# Patient Record
Sex: Female | Born: 1945 | Race: White | Hispanic: No | Marital: Married | Smoking: Former smoker
Health system: Southern US, Community
[De-identification: ages and names within clinical notes are randomized; demographics above are authoritative.]

## PROBLEM LIST (undated history)

## (undated) DIAGNOSIS — K219 Gastro-esophageal reflux disease without esophagitis: Secondary | ICD-10-CM

## (undated) DIAGNOSIS — Z9889 Other specified postprocedural states: Secondary | ICD-10-CM

## (undated) DIAGNOSIS — N83202 Unspecified ovarian cyst, left side: Secondary | ICD-10-CM

## (undated) DIAGNOSIS — R112 Nausea with vomiting, unspecified: Secondary | ICD-10-CM

## (undated) DIAGNOSIS — J45909 Unspecified asthma, uncomplicated: Secondary | ICD-10-CM

## (undated) DIAGNOSIS — Z889 Allergy status to unspecified drugs, medicaments and biological substances status: Secondary | ICD-10-CM

## (undated) DIAGNOSIS — I10 Essential (primary) hypertension: Secondary | ICD-10-CM

## (undated) DIAGNOSIS — Z8489 Family history of other specified conditions: Secondary | ICD-10-CM

## (undated) DIAGNOSIS — K5900 Constipation, unspecified: Secondary | ICD-10-CM

## (undated) DIAGNOSIS — J40 Bronchitis, not specified as acute or chronic: Secondary | ICD-10-CM

## (undated) DIAGNOSIS — J349 Unspecified disorder of nose and nasal sinuses: Secondary | ICD-10-CM

## (undated) DIAGNOSIS — N83201 Unspecified ovarian cyst, right side: Secondary | ICD-10-CM

## (undated) DIAGNOSIS — D649 Anemia, unspecified: Secondary | ICD-10-CM

## (undated) DIAGNOSIS — Z87442 Personal history of urinary calculi: Secondary | ICD-10-CM

## (undated) DIAGNOSIS — M1990 Unspecified osteoarthritis, unspecified site: Secondary | ICD-10-CM

## (undated) HISTORY — PX: BACK SURGERY: SHX140

## (undated) HISTORY — DX: Unspecified disorder of nose and nasal sinuses: J34.9

## (undated) HISTORY — PX: CATARACT EXTRACTION: SUR2

## (undated) HISTORY — PX: BREAST EXCISIONAL BIOPSY: SUR124

## (undated) HISTORY — DX: Essential (primary) hypertension: I10

## (undated) HISTORY — PX: OTHER SURGICAL HISTORY: SHX169

---

## 1982-12-07 HISTORY — PX: TUBAL LIGATION: SHX77

## 2007-03-24 ENCOUNTER — Ambulatory Visit: Payer: Self-pay | Admitting: *Deleted

## 2007-05-03 ENCOUNTER — Ambulatory Visit: Payer: Self-pay | Admitting: Gastroenterology

## 2010-04-03 ENCOUNTER — Ambulatory Visit: Payer: Self-pay | Admitting: Family Medicine

## 2012-05-10 ENCOUNTER — Ambulatory Visit: Payer: Self-pay | Admitting: Family Medicine

## 2013-02-14 ENCOUNTER — Encounter: Payer: Self-pay | Admitting: Pulmonary Disease

## 2013-02-14 ENCOUNTER — Ambulatory Visit (INDEPENDENT_AMBULATORY_CARE_PROVIDER_SITE_OTHER): Payer: BC Managed Care – PPO | Admitting: Pulmonary Disease

## 2013-02-14 VITALS — BP 142/80 | HR 80 | Temp 98.2°F | Ht 67.0 in | Wt 207.0 lb

## 2013-02-14 DIAGNOSIS — J382 Nodules of vocal cords: Secondary | ICD-10-CM | POA: Insufficient documentation

## 2013-02-14 DIAGNOSIS — R05 Cough: Secondary | ICD-10-CM

## 2013-02-14 NOTE — Assessment & Plan Note (Signed)
She apparently did well with Flonase for her near continuous nasal congestion and postnasal drip. She stopped this medication because "she went to see how bad it was". I think that this is likely contributing to her cough.  Plan: -Resume cetirizine, Flonase

## 2013-02-14 NOTE — Patient Instructions (Addendum)
We will send you for a Chest X-ray to evaluate your cough  Keep using your inhaler (albuterol) as needed for shortness of breath/cough with the spacer we provided you  Resume your flonase  Use [facility] rinses with distilled water at least twice per day using the instructions on the package. 1/2 hour after using the [facility] rinse, use Flonase two puffs in each nostril once per day. Use chlortrimeton and an over the counter decongestant (pseudophed or phenylphrine) as needed for the cough. If you are not better in 2-3 weeks, then start taking Prilosec 20mg  oral daily.  Follow the lifestyle modification information we provided you regarding acid reflux  We will see you back in 3-4 weeks or sooner if needed

## 2013-02-14 NOTE — Assessment & Plan Note (Signed)
She has had to have these removed in the past. She experienced hoarseness at the time. She is currently experiencing some hoarseness now which raises concern for recurrent nodules. We will treat her postnasal drip and see if the hoarseness improved, but if it does not she will need referral to ear nose and throat.

## 2013-02-14 NOTE — Assessment & Plan Note (Signed)
I explained to Mrs. Jobst that the most likely etiology for her prolonged cough is that she contracted a virus such as influenza in November and has taken a while for her to heal. We have had multiple cases of influenza this year which have caused prolonged cough symptoms because of significant airway damage. Fortunately, the vast majority of these people improve with time.  I am reassured that she has little evidence of underlying lung pathology given her clear lungs on physical exam as well as the normal simple spirometry seen today in clinic. She needs to have a chest x-ray to rule out any other abnormality.  I think that allergic rhinitis and possibly GERD also contributes to her ongoing cough associated as below for further advice.  Plan: -Chest x-ray for cough -See allergic rhinitis and GERD -When necessary over-the-counter cough suppressant as needed.

## 2013-02-14 NOTE — Assessment & Plan Note (Signed)
She has some acid reflux symptoms but these are basically mild. If her cough does not improve with treatment of the allergic rhinitis and supportive care they may be reasonable to start her on something for acid reflux. In the meantime we provided her with literature today for lifestyle modification to prevent and manage acid reflux.

## 2013-02-14 NOTE — Progress Notes (Signed)
Subjective:    Patient ID: Brandi Newton, female    DOB: Sep 17, 1946, 67 y.o.   MRN: [id number]  HPI  This is a very pleasant 67 year old female who comes to our clinic today for evaluation of cough. She had a normal childhood without respiratory illnesses but in November of 2013 she developed a significant cough. She stated that her symptoms started with sinus congestion and postnasal drip and eventually led to a "tickling throat" and chest congestion. She has been treated with multiple rounds of antibiotics and started on inhaler for the cough. She also developed shortness of breath with the coughing spells which she has never had before.  In general, her symptoms have improved but she still has a persistent cough and wheezing at night. She states that she feels like she has a "tickling" in her throat and she has the sensation that she needs to clear her throat frequently. If she laughs a lot she will start with a coughing spell that last for quite some time. This is typically followed by some shortness of breath and wheezing. She is also noted that she's had increasing snoring.   She states that in the past she has had seen years nodules removed from her vocal cords. When she had that she felt hoarse. In the last several months she has felt hoarse again and so she is wondered if the nodules are back.  She also notes some shortness of breath when she has to move quickly such as with prior troubles at school. She is capable of climbing multiple flights of stairs, carrying in groceries, and performing all of her activities of daily living without limitation. Mostly her shortness of breath occurs with coughing spells.  She has sinus congestion and an occasional sensation of a postnasal drip. This is improved significantly with the recent addition of Flonase. She also notes heartburn with certain medications but it is not a regular occurrence.   Past Medical History  Diagnosis Date  . Hypertension    . Sinus problem      Family History  Problem Relation Age of Onset  . Heart disease Mother   . Heart disease Father   . Skin cancer Father      History   Social History  . Marital Status: Married    Spouse Name: N/A    Number of Children: 2  . Years of Education: N/A   Occupational History  . Teacher    Social History Main Topics  . Smoking status: Former Smoker -- 0.50 packs/day for 20 years    Types: Cigarettes    Quit date: 12/07/1978  . Smokeless tobacco: Not on file  . Alcohol Use: No  . Drug Use: No  . Sexually Active: Not on file   Other Topics Concern  . Not on file   Social History Narrative  . No narrative on file     No Known Allergies   No outpatient prescriptions prior to visit.   No facility-administered medications prior to visit.      Review of Systems  Constitutional: Negative for fever, chills and unexpected weight change.  HENT: Positive for voice change and sinus pressure. Negative for ear pain, nosebleeds, congestion, sore throat, rhinorrhea, sneezing, trouble swallowing, dental problem and postnasal drip.   Eyes: Negative for visual disturbance.  Respiratory: Positive for cough and shortness of breath. Negative for choking.   Cardiovascular: Negative for chest pain and leg swelling.  Gastrointestinal: Negative for vomiting, abdominal pain and diarrhea.  Genitourinary:  Negative for difficulty urinating.  Musculoskeletal: Positive for arthralgias.  Skin: Negative for rash.  Neurological: Negative for tremors, syncope and headaches.  Hematological: Does not bruise/bleed easily.       Objective:   Physical Exam  Filed Vitals:   02/14/13 1528  BP: 142/80  Pulse: 80  Temp: 98.2 F (36.8 C)  TempSrc: Oral  Height: 5\' 7"  (1.702 m)  Weight: 207 lb (93.895 kg)  SpO2: 97%   Gen: well appearing, no acute distress HEENT: NCAT, PERRL, EOMi, OP clear, neck supple without masses PULM: Rhonchi anterior R > L; otherwise clear CV:  RRR, no mgr, no JVD AB: BS+, soft, nontender, no hsm Ext: warm, no edema, no clubbing, no cyanosis Derm: no rash or skin breakdown Neuro: A&Ox4, CN II-XII intact, strength 5/5 in all 4 extremities  02/14/2013 Simple spirometry normal     Assessment & Plan:   Cough I explained to Mrs. Villari that the most likely etiology for her prolonged cough is that she contracted a virus such as influenza in November and has taken a while for her to heal. We have had multiple cases of influenza this year which have caused prolonged cough symptoms because of significant airway damage. Fortunately, the vast majority of these people improve with time.  I am reassured that she has little evidence of underlying lung pathology given her clear lungs on physical exam as well as the normal simple spirometry seen today in clinic. She needs to have a chest x-ray to rule out any other abnormality.  I think that allergic rhinitis and possibly GERD also contributes to her ongoing cough associated as below for further advice.  Plan: -Chest x-ray for cough -See allergic rhinitis and GERD -When necessary over-the-counter cough suppressant as needed.  Allergic rhinitis She apparently did well with Flonase for her near continuous nasal congestion and postnasal drip. She stopped this medication because "she went to see how bad it was". I think that this is likely contributing to her cough.  Plan: -Resume cetirizine, Flonase  GERD (gastroesophageal reflux disease) She has some acid reflux symptoms but these are basically mild. If her cough does not improve with treatment of the allergic rhinitis and supportive care they may be reasonable to start her on something for acid reflux. In the meantime we provided her with literature today for lifestyle modification to prevent and manage acid reflux.  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 She experienced hoarseness at the time. She is currently  experiencing some hoarseness now which raises concern for recurrent nodules. We will treat her postnasal drip and see if the hoarseness improved, but if it does not she will need referral to ear nose and throat.    Updated Medication List Outpatient Encounter Prescriptions as of 02/14/2013  Medication Sig Dispense Refill  . albuterol (PROAIR HFA) 108 (90 BASE) MCG/ACT inhaler Inhale 2 puffs into the lungs every 6 (six) hours as needed for wheezing.      . cetirizine (ZYRTEC) 10 MG tablet Take 10 mg by mouth daily as needed for allergies.      . fluticasone (FLONASE) 50 MCG/ACT nasal spray 2 puffs each nostril daily as needed      . losartan-hydrochlorothiazide (HYZAAR) 50-12.5 MG per tablet Take 1 tablet by mouth daily.       No facility-administered encounter medications on file as of 02/14/2013.

## 2013-02-15 ENCOUNTER — Ambulatory Visit (INDEPENDENT_AMBULATORY_CARE_PROVIDER_SITE_OTHER)
Admission: RE | Admit: 2013-02-15 | Discharge: 2013-02-15 | Disposition: A | Discharge status: Home / Self Care | Payer: BC Managed Care – PPO | Source: Ambulatory Visit | Attending: Pulmonary Disease | Admitting: Pulmonary Disease

## 2013-02-17 NOTE — Progress Notes (Signed)
Quick Note:  Spoke with spouse and notified of results and he verbalized understanding and will inform the pt ______

## 2013-02-27 ENCOUNTER — Telehealth: Payer: Self-pay | Admitting: Pulmonary Disease

## 2013-02-27 NOTE — Telephone Encounter (Signed)
Pt cancelled appt 4/1; states x-ray was clear and the appt was to f/u on the x-ray so sees no need.  Pt also wants to be sure that her results of COPD test and x-rays are sent to [facility].  Pt states she had done the record request and we have it on file.

## 2013-02-28 NOTE — Telephone Encounter (Signed)
Returning call can be reached at [phone] says when you call to have her paged.Brandi Newton

## 2013-02-28 NOTE — Telephone Encounter (Signed)
Pt's spouse called back. He says he will advise his wife to call if she needs anything further. Otherwise she is unable to keep calling back and fourth since she is a [employer]. She did not need an appt as of now per spouse, just needs results forwarded per request. Brandi Newton

## 2013-02-28 NOTE — Telephone Encounter (Signed)
Called, spoke with pt's husband.  Reports pt is currently at work.  He will ask her to call [place] office when she gets off.  Will hold msg in triage to f/u on as it wasn't created by our office.

## 2013-03-01 NOTE — Telephone Encounter (Addendum)
Pt returned triage's call.  Pt states to call her at [phone] & have her paged.   Antionette Fairy

## 2013-03-01 NOTE — Telephone Encounter (Signed)
lmtcb x1-- need to know the name of what physician she wants [place] to send this to and phone #. See no records release on file.

## 2013-03-01 NOTE — Telephone Encounter (Signed)
I have faxed records from her visit to Dr Lorin Picket at [facility] on her home phone to let her know this was done

## 2013-03-07 ENCOUNTER — Ambulatory Visit: Payer: BC Managed Care – PPO | Admitting: Pulmonary Disease

## 2013-04-18 ENCOUNTER — Ambulatory Visit: Payer: Self-pay | Admitting: Family Medicine

## 2013-07-18 ENCOUNTER — Ambulatory Visit: Payer: Self-pay | Admitting: Family Medicine

## 2015-01-24 DIAGNOSIS — M1712 Unilateral primary osteoarthritis, left knee: Secondary | ICD-10-CM | POA: Insufficient documentation

## 2015-01-24 DIAGNOSIS — M170 Bilateral primary osteoarthritis of knee: Secondary | ICD-10-CM | POA: Insufficient documentation

## 2015-02-05 ENCOUNTER — Ambulatory Visit: Payer: Self-pay | Admitting: Family Medicine

## 2015-08-28 ENCOUNTER — Emergency Department
Admission: EM | Admit: 2015-08-28 | Discharge: 2015-08-28 | Disposition: A | Discharge status: Home / Self Care | Payer: Medicare Other | Attending: Emergency Medicine | Admitting: Emergency Medicine

## 2015-08-28 ENCOUNTER — Emergency Department: Payer: Medicare Other

## 2015-08-28 DIAGNOSIS — R1900 Intra-abdominal and pelvic swelling, mass and lump, unspecified site: Secondary | ICD-10-CM | POA: Diagnosis not present

## 2015-08-28 DIAGNOSIS — I10 Essential (primary) hypertension: Secondary | ICD-10-CM | POA: Diagnosis not present

## 2015-08-28 DIAGNOSIS — R1030 Lower abdominal pain, unspecified: Secondary | ICD-10-CM

## 2015-08-28 DIAGNOSIS — Z79899 Other long term (current) drug therapy: Secondary | ICD-10-CM | POA: Insufficient documentation

## 2015-08-28 DIAGNOSIS — N8320 Unspecified ovarian cysts: Secondary | ICD-10-CM | POA: Diagnosis not present

## 2015-08-28 DIAGNOSIS — R109 Unspecified abdominal pain: Secondary | ICD-10-CM | POA: Diagnosis present

## 2015-08-28 DIAGNOSIS — Z87891 Personal history of nicotine dependence: Secondary | ICD-10-CM | POA: Diagnosis not present

## 2015-08-28 DIAGNOSIS — K5900 Constipation, unspecified: Secondary | ICD-10-CM | POA: Insufficient documentation

## 2015-08-28 DIAGNOSIS — N83202 Unspecified ovarian cyst, left side: Secondary | ICD-10-CM

## 2015-08-28 DIAGNOSIS — N83201 Unspecified ovarian cyst, right side: Secondary | ICD-10-CM

## 2015-08-28 LAB — CBC WITH DIFFERENTIAL/PLATELET
BASOS ABS: 0.3 10*3/uL — AB (ref 0–0.1)
BASOS PCT: 2 %
Eosinophils Absolute: 0.3 10*3/uL (ref 0–0.7)
Eosinophils Relative: 2 %
HEMATOCRIT: 44 % (ref 35.0–47.0)
Hemoglobin: 14.3 g/dL (ref 12.0–16.0)
Lymphocytes Relative: 22 %
Lymphs Abs: 3.3 10*3/uL (ref 1.0–3.6)
MCH: 28.1 pg (ref 26.0–34.0)
MCHC: 32.5 g/dL (ref 32.0–36.0)
MCV: 86.3 fL (ref 80.0–100.0)
MONO ABS: 1.2 10*3/uL — AB (ref 0.2–0.9)
Monocytes Relative: 8 %
NEUTROS ABS: 10.1 10*3/uL — AB (ref 1.4–6.5)
NEUTROS PCT: 66 %
Platelets: 366 10*3/uL (ref 150–440)
RBC: 5.1 MIL/uL (ref 3.80–5.20)
RDW: 15.3 % — AB (ref 11.5–14.5)
WBC: 15 10*3/uL — AB (ref 3.6–11.0)

## 2015-08-28 LAB — URINALYSIS COMPLETE WITH MICROSCOPIC (ARMC ONLY)
BACTERIA UA: NONE SEEN
Bilirubin Urine: NEGATIVE
Glucose, UA: NEGATIVE mg/dL
HGB URINE DIPSTICK: NEGATIVE
Ketones, ur: NEGATIVE mg/dL
LEUKOCYTES UA: NEGATIVE
Nitrite: NEGATIVE
PH: 7 (ref 5.0–8.0)
Protein, ur: NEGATIVE mg/dL
Specific Gravity, Urine: 1.014 (ref 1.005–1.030)

## 2015-08-28 LAB — COMPREHENSIVE METABOLIC PANEL
ALBUMIN: 4.1 g/dL (ref 3.5–5.0)
ALT: 15 U/L (ref 14–54)
AST: 21 U/L (ref 15–41)
Alkaline Phosphatase: 73 U/L (ref 38–126)
Anion gap: 9 (ref 5–15)
BILIRUBIN TOTAL: 0.3 mg/dL (ref 0.3–1.2)
BUN: 19 mg/dL (ref 6–20)
CHLORIDE: 103 mmol/L (ref 101–111)
CO2: 29 mmol/L (ref 22–32)
Calcium: 10.6 mg/dL — ABNORMAL HIGH (ref 8.9–10.3)
Creatinine, Ser: 1.1 mg/dL — ABNORMAL HIGH (ref 0.44–1.00)
GFR calc Af Amer: 59 mL/min — ABNORMAL LOW (ref >60)
GFR calc non Af Amer: 51 mL/min — ABNORMAL LOW (ref >60)
GLUCOSE: 127 mg/dL — AB (ref 65–99)
POTASSIUM: 4 mmol/L (ref 3.5–5.1)
Sodium: 141 mmol/L (ref 135–145)
TOTAL PROTEIN: 7.5 g/dL (ref 6.5–8.1)

## 2015-08-28 LAB — LIPASE, BLOOD: Lipase: 34 U/L (ref 22–51)

## 2015-08-28 MED ORDER — ONDANSETRON 4 MG PO TBDP
4.0000 mg | ORAL_TABLET | Freq: Once | ORAL | Status: AC
  Administered 2015-08-28: 4 mg via ORAL
  Filled 2015-08-28: qty 1

## 2015-08-28 MED ORDER — MORPHINE SULFATE (PF) 4 MG/ML IV SOLN
4.0000 mg | Freq: Once | INTRAVENOUS | Status: AC
  Administered 2015-08-28: 4 mg via INTRAVENOUS

## 2015-08-28 MED ORDER — ONDANSETRON HCL 4 MG/2ML IJ SOLN
4.0000 mg | Freq: Once | INTRAMUSCULAR | Status: AC
  Administered 2015-08-28: 4 mg via INTRAVENOUS
  Filled 2015-08-28: qty 2

## 2015-08-28 MED ORDER — HYDROCODONE-ACETAMINOPHEN 5-325 MG PO TABS: 1.0000 | ORAL_TABLET | Freq: Four times a day (QID) | ORAL | Status: DC | PRN

## 2015-08-28 MED ORDER — ONDANSETRON HCL 4 MG PO TABS: 4.0000 mg | ORAL_TABLET | Freq: Every day | ORAL | Status: DC | PRN

## 2015-08-28 MED ORDER — IOHEXOL 240 MG/ML SOLN
25.0000 mL | Freq: Once | INTRAMUSCULAR | Status: AC | PRN
  Administered 2015-08-28: 25 mL via ORAL

## 2015-08-28 MED ORDER — MORPHINE SULFATE (PF) 4 MG/ML IV SOLN
INTRAVENOUS | Status: AC
  Administered 2015-08-28: 4 mg via INTRAVENOUS
  Filled 2015-08-28: qty 1

## 2015-08-28 MED ORDER — IOHEXOL 300 MG/ML  SOLN
100.0000 mL | Freq: Once | INTRAMUSCULAR | Status: AC | PRN
  Administered 2015-08-28: 100 mL via INTRAVENOUS

## 2015-08-28 MED ORDER — SODIUM CHLORIDE 0.9 % IV BOLUS (SEPSIS)
1000.0000 mL | Freq: Once | INTRAVENOUS | Status: AC
  Administered 2015-08-28: 1000 mL via INTRAVENOUS

## 2015-08-28 MED ORDER — PEG 3350-KCL-NA BICARB-NACL 420 G PO SOLR: 4000.0000 mL | Freq: Once | ORAL | Status: DC

## 2015-08-28 MED ORDER — MORPHINE SULFATE (PF) 4 MG/ML IV SOLN
4.0000 mg | Freq: Once | INTRAVENOUS | Status: AC
  Administered 2015-08-28: 4 mg via INTRAVENOUS
  Filled 2015-08-28: qty 1

## 2015-08-28 NOTE — ED Provider Notes (Signed)
I have discussed the case with Dr. Georgianne Fick. Ultrasound and CT findings were discussed, a CEA 125 was ordered. She'll be seen in the office for reevaluation. I'll discharge her with TriLyte and Vicodin.  Earleen Newport, MD 08/28/15 [phone]

## 2015-08-28 NOTE — ED Notes (Signed)
Patient transported to Ultrasound 

## 2015-08-28 NOTE — Discharge Instructions (Signed)
Abdominal Pain  Many things can cause abdominal pain. Usually, abdominal pain is not caused by a disease and will improve without treatment. It can often be observed and treated at home. Your health care provider will do a physical exam and possibly order blood tests and X-rays to help determine the seriousness of your pain. However, in many cases, more time must pass before a clear cause of the pain can be found. Before that point, your health care provider may not know if you need more testing or further treatment.  HOME CARE INSTRUCTIONS   Monitor your abdominal pain for any changes. The following actions may help to alleviate any discomfort you are experiencing:  · Only take over-the-counter or prescription medicines as directed by your health care provider.  · Do not take laxatives unless directed to do so by your health care provider.  · Try a clear liquid diet (broth, tea, or water) as directed by your health care provider. Slowly move to a bland diet as tolerated.  SEEK MEDICAL CARE IF:  · You have unexplained abdominal pain.  · You have abdominal pain associated with nausea or diarrhea.  · You have pain when you urinate or have a bowel movement.  · You experience abdominal pain that wakes you in the night.  · You have abdominal pain that is worsened or improved by eating food.  · You have abdominal pain that is worsened with eating fatty foods.  · You have a fever.  SEEK IMMEDIATE MEDICAL CARE IF:   · Your pain does not go away within 2 hours.  · You keep throwing up (vomiting).  · Your pain is felt only in portions of the abdomen, such as the right side or the left lower portion of the abdomen.  · You pass bloody or black tarry stools.  MAKE SURE YOU:  · Understand these instructions.    · Will watch your condition.    · Will get help right away if you are not doing well or get worse.    Document Released: 09/02/2005 Document Revised: 11/28/2013 Document Reviewed: 08/02/2013  ExitCare® Patient Information  ©2015 [employer]. This information is not intended to replace advice given to you by your health care provider. Make sure you discuss any questions you have with your health care provider.        Ovarian Cyst  An ovarian cyst is a fluid-filled sac that forms on an ovary. The ovaries are small organs that produce eggs in women. Various types of cysts can form on the ovaries. Most are not cancerous. Many do not cause problems, and they often go away on their own. Some may cause symptoms and require treatment. Common types of ovarian cysts include:  · Functional cysts--These cysts may occur every month during the menstrual cycle. This is normal. The cysts usually go away with the next menstrual cycle if the woman does not get pregnant. Usually, there are no symptoms with a functional cyst.  · Endometrioma cysts--These cysts form from the tissue that lines the uterus. They are also called "chocolate cysts" because they become filled with blood that turns brown. This type of cyst can cause pain in the lower abdomen during intercourse and with your menstrual period.  · Cystadenoma cysts--This type develops from the cells on the outside of the ovary. These cysts can get very big and cause lower abdomen pain and pain with intercourse. This type of cyst can twist on itself, cut off its blood supply, and cause severe   pain. It can also easily rupture and cause a lot of pain.  · Dermoid cysts--This type of cyst is sometimes found in both ovaries. These cysts may contain different kinds of body tissue, such as skin, teeth, hair, or cartilage. They usually do not cause symptoms unless they get very big.  · Theca lutein cysts--These cysts occur when too much of a certain hormone (human chorionic gonadotropin) is produced and overstimulates the ovaries to produce an egg. This is most common after procedures used to assist with the conception of a baby (in vitro fertilization).  CAUSES   · Fertility drugs can cause a condition in  which multiple large cysts are formed on the ovaries. This is called ovarian hyperstimulation syndrome.  · A condition called polycystic ovary syndrome can cause hormonal imbalances that can lead to nonfunctional ovarian cysts.  SIGNS AND SYMPTOMS   Many ovarian cysts do not cause symptoms. If symptoms are present, they may include:  · Pelvic pain or pressure.  · Pain in the lower abdomen.  · Pain during sexual intercourse.  · Increasing girth (swelling) of the abdomen.  · Abnormal menstrual periods.  · Increasing pain with menstrual periods.  · Stopping having menstrual periods without being pregnant.  DIAGNOSIS   These cysts are commonly found during a routine or annual pelvic exam. Tests may be ordered to find out more about the cyst. These tests may include:  · Ultrasound.  · X-ray of the pelvis.  · CT scan.  · MRI.  · Blood tests.  TREATMENT   Many ovarian cysts go away on their own without treatment. Your health care provider may want to check your cyst regularly for 2-3 months to see if it changes. For women in menopause, it is particularly important to monitor a cyst closely because of the higher rate of ovarian cancer in menopausal women. When treatment is needed, it may include any of the following:  · A procedure to drain the cyst (aspiration). This may be done using a long needle and ultrasound. It can also be done through a laparoscopic procedure. This involves using a thin, lighted tube with a tiny camera on the end (laparoscope) inserted through a small incision.  · Surgery to remove the whole cyst. This may be done using laparoscopic surgery or an open surgery involving a larger incision in the lower abdomen.  · Hormone treatment or birth control pills. These methods are sometimes used to help dissolve a cyst.  HOME CARE INSTRUCTIONS   · Only take over-the-counter or prescription medicines as directed by your health care provider.  · Follow up with your health care provider as directed.  · Get  regular pelvic exams and Pap tests.  SEEK MEDICAL CARE IF:   · Your periods are late, irregular, or painful, or they stop.  · Your pelvic pain or abdominal pain does not go away.  · Your abdomen becomes larger or swollen.  · You have pressure on your bladder or trouble emptying your bladder completely.  · You have pain during sexual intercourse.  · You have feelings of fullness, pressure, or discomfort in your stomach.  · You lose weight for no apparent reason.  · You feel generally ill.  · You become constipated.  · You lose your appetite.  · You develop acne.  · You have an increase in body and facial hair.  · You are gaining weight, without changing your exercise and eating habits.  · You think you are pregnant.    SEEK IMMEDIATE MEDICAL CARE IF:   · You have increasing abdominal pain.  · You feel sick to your stomach (nauseous), and you throw up (vomit).  · You develop a fever that comes on suddenly.  · You have abdominal pain during a bowel movement.  · Your menstrual periods become heavier than usual.  MAKE SURE YOU:  · Understand these instructions.  · Will watch your condition.  · Will get help right away if you are not doing well or get worse.  Document Released: 11/23/2005 Document Revised: 11/28/2013 Document Reviewed: 07/31/2013  ExitCare® Patient Information ©2015 [employer]. This information is not intended to replace advice given to you by your health care provider. Make sure you discuss any questions you have with your health care provider.

## 2015-08-28 NOTE — ED Provider Notes (Signed)
[facility] Emergency Department Provider Note  ____________________________________________  Time seen: Approximately 213 AM  I have reviewed the triage vital signs and the nursing notes.   HISTORY  Chief Complaint Abdominal Pain    HPI Brandi Newton is a 69 y.o. female who comes into the hospital today with abdominal pain. The patient reports she's had abdominal pain since Sunday. The patient thought that this was just constipation. She took some magnesium citrate but reports that after a few hours she was still in a lot of pain. The patient gets constipated off and reports her last bowel movement was approximately on Sunday. She typically has some hard pellet stool. She reports that the pain keeps cramping up and is worse in her right lower quadrant. The patient reports she wants to make sure that everything is okay. She is gone to the bathroom some but not as much as she would expect for having drank magnesium citrate. The patient denies any fevers or vomiting and has had some indigestion. She also reports that her abdomen seems swollen.The patient reports the pain as a 6-7 out of 10 in intensity. She denies any chest pain denies shortness of breath headache or blurred vision.   Past Medical History  Diagnosis Date  . Hypertension   . Sinus problem     Patient Active Problem List   Diagnosis Date Noted  . Cough 02/14/2013  . Allergic rhinitis 02/14/2013  . GERD (gastroesophageal reflux disease) 02/14/2013  . 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 Breast lumpectomy Left 1970  . Tubal ligation  1984    Current Outpatient Rx  Name  Route  Sig  Dispense  Refill  . Cholecalciferol (VITAMIN D3) 5000 UNITS TABS   Oral   Take 1 tablet by mouth daily.         Marland Kitchen losartan-hydrochlorothiazide (HYZAAR) 50-12.5 MG per tablet   Oral   Take 1 tablet by mouth daily.         . naproxen sodium (ANAPROX) 220 MG  tablet   Oral   Take 220 mg by mouth 2 (two) times daily with a meal.         . vitamin B-12 (CYANOCOBALAMIN) 1000 MCG tablet   Oral   Take 1,000 mcg by mouth daily.         Marland Kitchen albuterol (PROAIR HFA) 108 (90 BASE) MCG/ACT inhaler   Inhalation   Inhale 2 puffs into the lungs every 6 (six) hours as needed for wheezing.         . cetirizine (ZYRTEC) 10 MG tablet   Oral   Take 10 mg by mouth daily as needed for allergies.         . fluticasone (FLONASE) 50 MCG/ACT nasal spray      2 puffs each nostril daily as needed           Allergies Ace inhibitors  Family History  Problem Relation Age of Onset  . Heart disease Mother   . Heart disease Father   . Skin cancer Father     Social History Social History  Substance Use Topics  . Smoking status: Former Smoker -- 0.50 packs/day for 20 years    Types: Cigarettes    Quit date: 12/07/1978  . Smokeless tobacco: None  . Alcohol Use: No    Review of Systems Constitutional: No fever/chills Eyes: No visual changes. ENT: No sore throat. Cardiovascular: Denies chest pain. Respiratory: Denies  shortness of breath. Gastrointestinal: abdominal pain and constipation  No nausea, no vomiting.   Genitourinary: Negative for dysuria. Musculoskeletal: Negative for back pain. Skin: Negative for rash. Neurological: Negative for headaches, focal weakness or numbness.  10-point ROS otherwise negative.  ____________________________________________   PHYSICAL EXAM:  VITAL SIGNS: ED Triage Vitals  Enc Vitals Group     BP 08/28/15 0041 171/90 mmHg     Pulse Rate 08/28/15 0041 102     Resp --      Temp 08/28/15 0041 98.1 F (36.7 C)     Temp Source 08/28/15 0041 Oral     SpO2 08/28/15 0041 96 %     Weight 08/28/15 0041 180 lb (81.647 kg)     Height 08/28/15 0041 5\' 7"  (1.702 m)     Head Cir --      Peak Flow --      Pain Score 08/28/15 0041 7     Pain Loc --      Pain Edu? --      Excl. in [place]? --     Constitutional:  Alert and oriented. Well appearing and in moderate distress. Eyes: Conjunctivae are normal. PERRL. EOMI. Head: Atraumatic. Nose: No congestion/rhinnorhea. Mouth/Throat: Mucous membranes are moist.  Oropharynx non-erythematous. Cardiovascular: Normal rate, regular rhythm. Grossly normal heart sounds.  Good peripheral circulation. Respiratory: Normal respiratory effort.  No retractions. Lungs CTAB. Gastrointestinal: Soft with bilateral lower abdomen tenderness to palpation right greater than left. Mild distention. Positive bowel sounds Musculoskeletal: No lower extremity tenderness nor edema.  No joint effusions. Neurologic:  Normal speech and language.  Skin:  Skin is warm, dry and intact.  Psychiatric: Mood and affect are normal. .  ____________________________________________   LABS (all labs ordered are listed, but only abnormal results are displayed)  Labs Reviewed  CBC WITH DIFFERENTIAL/PLATELET - Abnormal; Notable for the following:    WBC 15.0 (*)    RDW 15.3 (*)    Neutro Abs 10.1 (*)    Monocytes Absolute 1.2 (*)    Basophils Absolute 0.3 (*)    All other components within normal limits  COMPREHENSIVE METABOLIC PANEL - Abnormal; Notable for the following:    Glucose, Bld 127 (*)    Creatinine, Ser 1.10 (*)    Calcium 10.6 (*)    GFR calc non Af Amer 51 (*)    GFR calc Af Amer 59 (*)    All other components within normal limits  URINALYSIS COMPLETEWITH MICROSCOPIC ([facility] ONLY) - Abnormal; Notable for the following:    Color, Urine YELLOW (*)    APPearance CLEAR (*)    Squamous Epithelial / LPF 0-5 (*)    All other components within normal limits  LIPASE, BLOOD   ____________________________________________  EKG  None ____________________________________________  RADIOLOGY  CT scan abdomen pelvis: Large amount of retained large bowel stool without bowel obstruction, mild sigmoid pericolonic inflammation may be reactive. Complex 5.7 by 3.1 cm left adnexal cystic  mass in addition 4.1 x 3.4 cm intermediate density mass contiguous with right adnexum could represent endometrioma or hemorrhagic cyst. ____________________________________________   PROCEDURES  Procedure(s) performed: None  Critical Care performed: No  ____________________________________________   INITIAL IMPRESSION / ASSESSMENT AND PLAN / ED COURSE  Pertinent labs & imaging results that were available during my care of the patient were reviewed by me and considered in my medical decision making (see chart for details).  The patient did receive a dose of morphine and Zofran for her pain. It does appear that  the patient has some constipation which may be the cause of her abdominal distention but she does have bilateral adnexal masses 1 that is cystic and then one is an intermediate density. I will send the patient for an ultrasound to further evaluate the pelvic masses which may be also the cause of her pain and reassess the patient 1 she's had her ultrasound.  The patient's care will be signed out to Dr. Jimmye Norman will follow-up the results of the ultrasound. ____________________________________________   FINAL CLINICAL IMPRESSION(S) / ED DIAGNOSES  Final diagnoses:  Pelvic mass in female      Loney Hering, MD 08/28/15 [phone]

## 2015-08-28 NOTE — ED Notes (Signed)
Pt informed to return if any life threatening symptoms occur.  

## 2015-08-28 NOTE — ED Notes (Signed)
Pt in with lower abd pain worse to right abd.  Took mag citrate today last BM Sunday, no small results.  No vomiting does have urinary frequency.

## 2015-08-29 LAB — CA 125: CA 125: 31.4 U/mL (ref 0.0–38.1)

## 2015-09-18 ENCOUNTER — Ambulatory Visit
Admission: RE | Admit: 2015-09-18 | Discharge: 2015-09-18 | Disposition: A | Discharge status: Home / Self Care | Payer: Medicare Other | Source: Ambulatory Visit | Attending: Obstetrics & Gynecology | Admitting: Obstetrics & Gynecology

## 2015-09-18 ENCOUNTER — Encounter
Admission: RE | Admit: 2015-09-18 | Discharge: 2015-09-18 | Disposition: A | Discharge status: Home / Self Care | Payer: Medicare Other | Source: Ambulatory Visit | Attending: Obstetrics & Gynecology | Admitting: Obstetrics & Gynecology

## 2015-09-18 DIAGNOSIS — J9811 Atelectasis: Secondary | ICD-10-CM | POA: Diagnosis not present

## 2015-09-18 DIAGNOSIS — R05 Cough: Secondary | ICD-10-CM | POA: Diagnosis not present

## 2015-09-18 DIAGNOSIS — Z01810 Encounter for preprocedural cardiovascular examination: Secondary | ICD-10-CM | POA: Diagnosis present

## 2015-09-18 DIAGNOSIS — Z01818 Encounter for other preprocedural examination: Secondary | ICD-10-CM | POA: Diagnosis present

## 2015-09-18 DIAGNOSIS — Z01811 Encounter for preprocedural respiratory examination: Secondary | ICD-10-CM

## 2015-09-18 HISTORY — DX: Unspecified ovarian cyst, right side: N83.201

## 2015-09-18 HISTORY — DX: Allergy status to unspecified drugs, medicaments and biological substances: Z88.9

## 2015-09-18 HISTORY — DX: Constipation, unspecified: K59.00

## 2015-09-18 HISTORY — DX: Unspecified osteoarthritis, unspecified site: M19.90

## 2015-09-18 HISTORY — DX: Unspecified ovarian cyst, left side: N83.202

## 2015-09-18 HISTORY — DX: Unspecified asthma, uncomplicated: J45.909

## 2015-09-18 LAB — BASIC METABOLIC PANEL
ANION GAP: 8 (ref 5–15)
BUN: 13 mg/dL (ref 6–20)
CHLORIDE: 107 mmol/L (ref 101–111)
CO2: 26 mmol/L (ref 22–32)
Calcium: 10.7 mg/dL — ABNORMAL HIGH (ref 8.9–10.3)
Creatinine, Ser: 1.1 mg/dL — ABNORMAL HIGH (ref 0.44–1.00)
GFR calc non Af Amer: 51 mL/min — ABNORMAL LOW (ref >60)
GFR, EST AFRICAN AMERICAN: 59 mL/min — AB (ref >60)
Glucose, Bld: 84 mg/dL (ref 65–99)
POTASSIUM: 4 mmol/L (ref 3.5–5.1)
Sodium: 141 mmol/L (ref 135–145)

## 2015-09-18 LAB — TYPE AND SCREEN
ABO/RH(D): A POS
ANTIBODY SCREEN: NEGATIVE

## 2015-09-18 LAB — CBC
HEMATOCRIT: 41.3 % (ref 35.0–47.0)
HEMOGLOBIN: 13 g/dL (ref 12.0–16.0)
MCH: 27.6 pg (ref 26.0–34.0)
MCHC: 31.4 g/dL — ABNORMAL LOW (ref 32.0–36.0)
MCV: 87.8 fL (ref 80.0–100.0)
Platelets: 303 10*3/uL (ref 150–440)
RBC: 4.7 MIL/uL (ref 3.80–5.20)
RDW: 14.9 % — ABNORMAL HIGH (ref 11.5–14.5)
WBC: 7.8 10*3/uL (ref 3.6–11.0)

## 2015-09-18 NOTE — Patient Instructions (Signed)
  Your procedure is scheduled on: 10/27/16Thurs  Report to Day Surgery.2nd floor medical mall To find out your arrival time please call [phone] between 1PM - 3PM on 10/02/15 Wed  Remember: Instructions that are not followed completely may result in serious medical risk, up to and including death, or upon the discretion of your surgeon and anesthesiologist your surgery may need to be rescheduled.    __x__ 1. Do not eat food or drink liquids after midnight. No gum chewing or hard candies.     _x___ 2. No Alcohol for 24 hours before or after surgery.   ____ 3. Bring all medications with you on the day of surgery if instructed.    __x__ 4. Notify your doctor if there is any change in your medical condition     (cold, fever, infections).     Do not wear jewelry, make-up, hairpins, clips or nail polish.  Do not wear lotions, powders, or perfumes. You may wear deodorant.  Do not shave 48 hours prior to surgery. Men may shave face and neck.  Do not bring valuables to the hospital.    [facility] is not responsible for any belongings or valuables.               Contacts, dentures or bridgework may not be worn into surgery.  Leave your suitcase in the car. After surgery it may be brought to your room.  For patients admitted to the hospital, discharge time is determined by your                treatment team.   Patients discharged the day of surgery will not be allowed to drive home.   Please read over the following fact sheets that you were given:      __ Take these medicines the morning of surgery with A SIP OF WATER:    1. None  2.   3.   4.  5.  6.  ____ Fleet Enema (as directed)   _x_ Use CHG Soap as directed  ____ Use inhalers on the day of surgery  ____ Stop metformin 2 days prior to surgery    ____ Take 1/2 of usual insulin dose the night before surgery and none on the morning of surgery.   ____ Stop Coumadin/Plavix/aspirin on   _x__ Stop Anti-inflammatories on  Naproxen 1 week before surgery   ____ Stop supplements until after surgery.    ____ Bring C-Pap to the hospital.

## 2015-09-19 LAB — ABO/RH: ABO/RH(D): A POS

## 2015-09-19 LAB — CA 125: CA 125: 29.1 U/mL (ref 0.0–38.1)

## 2015-09-20 NOTE — OR Nursing (Addendum)
CXR faxed and called to Dr Leonides Schanz. Spoke with Estill Bamberg in the office and paged Dr Leonides Schanz to notify her. Spoke with Dr Leonides Schanz.

## 2015-09-20 NOTE — OR Nursing (Signed)
Cxr called and faxed as requested by Dr Ronelle Nigh to pcp and spoke with emily at [facility] clinic

## 2015-10-02 NOTE — OR Nursing (Signed)
Cleared by Dr Andria Frames. See note, requests SDS staff read PPD 10/03/15. Had chest CT and will followup with pulmonary post op

## 2015-10-03 ENCOUNTER — Ambulatory Visit: Payer: Medicare Other | Admitting: Anesthesiology

## 2015-10-03 ENCOUNTER — Observation Stay
Admission: RE | Admit: 2015-10-03 | Discharge: 2015-10-04 | Disposition: A | Discharge status: Home / Self Care | Payer: Medicare Other | Source: Ambulatory Visit | Attending: Obstetrics & Gynecology | Admitting: Obstetrics & Gynecology

## 2015-10-03 ENCOUNTER — Encounter
Admission: RE | Disposition: A | Discharge status: Home / Self Care | Payer: Self-pay | Source: Ambulatory Visit | Attending: Obstetrics & Gynecology

## 2015-10-03 ENCOUNTER — Encounter: Payer: Self-pay | Admitting: *Deleted

## 2015-10-03 DIAGNOSIS — I10 Essential (primary) hypertension: Secondary | ICD-10-CM | POA: Insufficient documentation

## 2015-10-03 DIAGNOSIS — D271 Benign neoplasm of left ovary: Secondary | ICD-10-CM | POA: Diagnosis not present

## 2015-10-03 DIAGNOSIS — Z9889 Other specified postprocedural states: Secondary | ICD-10-CM | POA: Insufficient documentation

## 2015-10-03 DIAGNOSIS — N841 Polyp of cervix uteri: Principal | ICD-10-CM | POA: Insufficient documentation

## 2015-10-03 DIAGNOSIS — N839 Noninflammatory disorder of ovary, fallopian tube and broad ligament, unspecified: Secondary | ICD-10-CM | POA: Diagnosis present

## 2015-10-03 DIAGNOSIS — Z791 Long term (current) use of non-steroidal anti-inflammatories (NSAID): Secondary | ICD-10-CM | POA: Diagnosis not present

## 2015-10-03 DIAGNOSIS — K5900 Constipation, unspecified: Secondary | ICD-10-CM | POA: Diagnosis not present

## 2015-10-03 DIAGNOSIS — Z888 Allergy status to other drugs, medicaments and biological substances status: Secondary | ICD-10-CM | POA: Insufficient documentation

## 2015-10-03 DIAGNOSIS — Z9079 Acquired absence of other genital organ(s): Secondary | ICD-10-CM

## 2015-10-03 DIAGNOSIS — Z90710 Acquired absence of both cervix and uterus: Secondary | ICD-10-CM

## 2015-10-03 DIAGNOSIS — D282 Benign neoplasm of uterine tubes and ligaments: Secondary | ICD-10-CM | POA: Diagnosis not present

## 2015-10-03 DIAGNOSIS — Z7951 Long term (current) use of inhaled steroids: Secondary | ICD-10-CM | POA: Diagnosis not present

## 2015-10-03 DIAGNOSIS — Z79899 Other long term (current) drug therapy: Secondary | ICD-10-CM | POA: Diagnosis not present

## 2015-10-03 DIAGNOSIS — Z90722 Acquired absence of ovaries, bilateral: Secondary | ICD-10-CM

## 2015-10-03 HISTORY — PX: LAPAROSCOPIC HYSTERECTOMY: SHX1926

## 2015-10-03 LAB — TYPE AND SCREEN
ABO/RH(D): A POS
Antibody Screen: NEGATIVE

## 2015-10-03 SURGERY — HYSTERECTOMY, TOTAL, LAPAROSCOPIC
Anesthesia: General | Laterality: Bilateral | Wound class: Clean Contaminated

## 2015-10-03 MED ORDER — SUGAMMADEX SODIUM 200 MG/2ML IV SOLN
INTRAVENOUS | Status: DC | PRN
  Administered 2015-10-03: 180 mg via INTRAVENOUS

## 2015-10-03 MED ORDER — FAMOTIDINE 20 MG PO TABS
ORAL_TABLET | ORAL | Status: AC
  Administered 2015-10-03: 20 mg via ORAL
  Filled 2015-10-03: qty 1

## 2015-10-03 MED ORDER — LACTATED RINGERS IV SOLN
INTRAVENOUS | Status: DC
  Administered 2015-10-03: 12:00:00 via INTRAVENOUS

## 2015-10-03 MED ORDER — BUPIVACAINE HCL 0.5 % IJ SOLN
INTRAMUSCULAR | Status: DC | PRN
  Administered 2015-10-03: 13 mL

## 2015-10-03 MED ORDER — MIDAZOLAM HCL 2 MG/2ML IJ SOLN
INTRAMUSCULAR | Status: DC | PRN
  Administered 2015-10-03: 2 mg via INTRAVENOUS

## 2015-10-03 MED ORDER — TRAMADOL HCL 50 MG PO TABS: 50.0000 mg | ORAL_TABLET | Freq: Four times a day (QID) | ORAL | Status: DC | PRN

## 2015-10-03 MED ORDER — HYDROMORPHONE HCL 1 MG/ML IJ SOLN: 0.5000 mg | INTRAMUSCULAR | Status: DC | PRN

## 2015-10-03 MED ORDER — MENTHOL 3 MG MT LOZG: 1.0000 | LOZENGE | OROMUCOSAL | Status: DC | PRN

## 2015-10-03 MED ORDER — DOCUSATE SODIUM 100 MG PO CAPS: 100.0000 mg | ORAL_CAPSULE | Freq: Two times a day (BID) | ORAL | Status: DC

## 2015-10-03 MED ORDER — HEPARIN SODIUM (PORCINE) 5000 UNIT/ML IJ SOLN
INTRAMUSCULAR | Status: AC
  Administered 2015-10-03: 5000 [IU] via SUBCUTANEOUS
  Filled 2015-10-03: qty 1

## 2015-10-03 MED ORDER — IBUPROFEN 600 MG PO TABS
600.0000 mg | ORAL_TABLET | Freq: Four times a day (QID) | ORAL | Status: DC
  Administered 2015-10-03 – 2015-10-04 (×3): 600 mg via ORAL
  Filled 2015-10-03 (×3): qty 1

## 2015-10-03 MED ORDER — ONDANSETRON HCL 4 MG/2ML IJ SOLN: 4.0000 mg | Freq: Four times a day (QID) | INTRAMUSCULAR | Status: DC | PRN

## 2015-10-03 MED ORDER — ONDANSETRON HCL 4 MG PO TABS: 4.0000 mg | ORAL_TABLET | Freq: Four times a day (QID) | ORAL | Status: DC | PRN

## 2015-10-03 MED ORDER — ACETAMINOPHEN 325 MG PO TABS
650.0000 mg | ORAL_TABLET | ORAL | Status: DC
  Administered 2015-10-03 – 2015-10-04 (×4): 650 mg via ORAL
  Filled 2015-10-03 (×4): qty 2

## 2015-10-03 MED ORDER — PROPOFOL 10 MG/ML IV BOLUS
INTRAVENOUS | Status: DC | PRN
  Administered 2015-10-03: 130 mg via INTRAVENOUS

## 2015-10-03 MED ORDER — EPHEDRINE SULFATE 50 MG/ML IJ SOLN
INTRAMUSCULAR | Status: DC | PRN
  Administered 2015-10-03: 10 mg via INTRAVENOUS

## 2015-10-03 MED ORDER — CEFAZOLIN SODIUM-DEXTROSE 2-3 GM-% IV SOLR
2.0000 g | Freq: Once | INTRAVENOUS | Status: AC
  Administered 2015-10-03: 2 g via INTRAVENOUS

## 2015-10-03 MED ORDER — SIMETHICONE 80 MG PO CHEW: 160.0000 mg | CHEWABLE_TABLET | Freq: Four times a day (QID) | ORAL | Status: DC | PRN

## 2015-10-03 MED ORDER — CEFAZOLIN SODIUM-DEXTROSE 2-3 GM-% IV SOLR
INTRAVENOUS | Status: AC
  Filled 2015-10-03: qty 50

## 2015-10-03 MED ORDER — FENTANYL CITRATE (PF) 100 MCG/2ML IJ SOLN: 25.0000 ug | INTRAMUSCULAR | Status: DC | PRN

## 2015-10-03 MED ORDER — FENTANYL CITRATE (PF) 100 MCG/2ML IJ SOLN
INTRAMUSCULAR | Status: AC
  Administered 2015-10-03: 50 ug via INTRAVENOUS
  Filled 2015-10-03: qty 2

## 2015-10-03 MED ORDER — KETOROLAC TROMETHAMINE 30 MG/ML IJ SOLN
30.0000 mg | Freq: Once | INTRAMUSCULAR | Status: AC
  Administered 2015-10-03: 30 mg via INTRAVENOUS

## 2015-10-03 MED ORDER — HEPARIN SODIUM (PORCINE) 5000 UNIT/ML IJ SOLN
5000.0000 [IU] | Freq: Once | INTRAMUSCULAR | Status: AC
  Administered 2015-10-03: 5000 [IU] via SUBCUTANEOUS

## 2015-10-03 MED ORDER — FENTANYL CITRATE (PF) 100 MCG/2ML IJ SOLN
25.0000 ug | INTRAMUSCULAR | Status: DC | PRN
  Administered 2015-10-03 (×2): 50 ug via INTRAVENOUS

## 2015-10-03 MED ORDER — IBUPROFEN 600 MG PO TABS: 600.0000 mg | ORAL_TABLET | Freq: Four times a day (QID) | ORAL | Status: DC | PRN

## 2015-10-03 MED ORDER — LIDOCAINE HCL (CARDIAC) 20 MG/ML IV SOLN
INTRAVENOUS | Status: DC | PRN
  Administered 2015-10-03: 30 mg via INTRAVENOUS

## 2015-10-03 MED ORDER — ROCURONIUM BROMIDE 100 MG/10ML IV SOLN
INTRAVENOUS | Status: DC | PRN
  Administered 2015-10-03: 20 mg via INTRAVENOUS
  Administered 2015-10-03: 30 mg via INTRAVENOUS

## 2015-10-03 MED ORDER — FENTANYL CITRATE (PF) 100 MCG/2ML IJ SOLN
INTRAMUSCULAR | Status: DC | PRN
  Administered 2015-10-03: 100 ug via INTRAVENOUS
  Administered 2015-10-03 (×2): 50 ug via INTRAVENOUS

## 2015-10-03 MED ORDER — METOCLOPRAMIDE HCL 5 MG/ML IJ SOLN: 10.0000 mg | Freq: Once | INTRAMUSCULAR | Status: DC | PRN

## 2015-10-03 MED ORDER — ONDANSETRON 4 MG PO TBDP: 4.0000 mg | ORAL_TABLET | ORAL | Status: DC | PRN

## 2015-10-03 MED ORDER — SODIUM CHLORIDE 0.9 % IV SOLN: 50.0000 mL/h | INTRAVENOUS | Status: DC

## 2015-10-03 MED ORDER — ONDANSETRON HCL 4 MG/2ML IJ SOLN
INTRAMUSCULAR | Status: DC | PRN
  Administered 2015-10-03: 4 mg via INTRAVENOUS

## 2015-10-03 MED ORDER — MEPERIDINE HCL 25 MG/ML IJ SOLN: 6.2500 mg | INTRAMUSCULAR | Status: DC | PRN

## 2015-10-03 MED ORDER — FAMOTIDINE 20 MG PO TABS
20.0000 mg | ORAL_TABLET | Freq: Once | ORAL | Status: AC
  Administered 2015-10-03: 20 mg via ORAL

## 2015-10-03 MED ORDER — KETOROLAC TROMETHAMINE 30 MG/ML IJ SOLN
INTRAMUSCULAR | Status: AC
  Filled 2015-10-03: qty 1

## 2015-10-03 SURGICAL SUPPLY — 54 items
ANCHOR TIS RET SYS 235ML (MISCELLANEOUS) ×6 IMPLANT
BAG URO DRAIN 2000ML W/SPOUT (MISCELLANEOUS) ×3 IMPLANT
BLADE SURG SZ11 CARB STEEL (BLADE) ×3 IMPLANT
CANISTER SUCT 1200ML W/VALVE (MISCELLANEOUS) ×3 IMPLANT
CATH FOLEY 2WAY  5CC 16FR (CATHETERS) ×2
CATH URTH 16FR FL 2W BLN LF (CATHETERS) ×1 IMPLANT
CHLORAPREP W/TINT 26ML (MISCELLANEOUS) ×3 IMPLANT
CNTNR SPEC 2.5X3XGRAD LEK (MISCELLANEOUS) ×2
CONT SPEC 4OZ STER OR WHT (MISCELLANEOUS) ×4
CONTAINER SPEC 2.5X3XGRAD LEK (MISCELLANEOUS) ×2 IMPLANT
DEFOGGER SCOPE WARMER CLEARIFY (MISCELLANEOUS) ×3 IMPLANT
DEVICE SUTURE ENDOST 10MM (ENDOMECHANICALS) ×3 IMPLANT
DRAPE LEGGINS SURG 28X43 STRL (DRAPES) ×3 IMPLANT
DRAPE UNDER BUTTOCK W/FLU (DRAPES) ×3 IMPLANT
DRSG TEGADERM 2-3/8X2-3/4 SM (GAUZE/BANDAGES/DRESSINGS) ×9 IMPLANT
ENDOSTITCH 0 SINGLE 48 (SUTURE) ×3 IMPLANT
GAUZE SPONGE NON-WVN 2X2 STRL (MISCELLANEOUS) ×1 IMPLANT
GLOVE SURG SYN 6.5 ES PF (GLOVE) ×12 IMPLANT
GOWN STRL REUS W/ TWL LRG LVL3 (GOWN DISPOSABLE) ×3 IMPLANT
GOWN STRL REUS W/ TWL XL LVL3 (GOWN DISPOSABLE) ×1 IMPLANT
GOWN STRL REUS W/TWL LRG LVL3 (GOWN DISPOSABLE) ×6
GOWN STRL REUS W/TWL XL LVL3 (GOWN DISPOSABLE) ×2
HIBICLENS CHG 4% 32OZ (MISCELLANEOUS) ×3 IMPLANT
IRRIGATION STRYKERFLOW (MISCELLANEOUS) ×1 IMPLANT
IRRIGATOR STRYKERFLOW (MISCELLANEOUS) ×3
IV LACTATED RINGERS 1000ML (IV SOLUTION) ×3 IMPLANT
KIT PINK PAD W/HEAD ARE REST (MISCELLANEOUS) ×3
KIT PINK PAD W/HEAD ARM REST (MISCELLANEOUS) ×1 IMPLANT
KIT RM TURNOVER CYSTO AR (KITS) ×3 IMPLANT
LABEL OR SOLS (LABEL) ×3 IMPLANT
LIGASURE BLUNT 5MM 37CM (INSTRUMENTS) ×3 IMPLANT
LIQUID BAND (GAUZE/BANDAGES/DRESSINGS) ×3 IMPLANT
MANIPULATOR VCARE LG CRV RETR (MISCELLANEOUS) IMPLANT
MANIPULATOR VCARE STD CRV RETR (MISCELLANEOUS) IMPLANT
NDL SAFETY 22GX1.5 (NEEDLE) ×3 IMPLANT
NEEDLE VERESS 14GA 120MM (NEEDLE) ×3 IMPLANT
NS IRRIG 500ML POUR BTL (IV SOLUTION) ×3 IMPLANT
PACK LAP CHOLECYSTECTOMY (MISCELLANEOUS) ×3 IMPLANT
PAD OB MATERNITY 4.3X12.25 (PERSONAL CARE ITEMS) ×3 IMPLANT
PAD PREP 24X41 OB/GYN DISP (PERSONAL CARE ITEMS) ×3 IMPLANT
PENCIL ELECTRO HAND CTR (MISCELLANEOUS) ×3 IMPLANT
SCISSORS METZENBAUM CVD 33 (INSTRUMENTS) IMPLANT
SET CYSTO W/LG BORE CLAMP LF (SET/KITS/TRAYS/PACK) ×3 IMPLANT
SLEEVE ENDOPATH XCEL 5M (ENDOMECHANICALS) ×6 IMPLANT
SPONGE LAP 18X18 5 PK (GAUZE/BANDAGES/DRESSINGS) ×3 IMPLANT
SPONGE VERSALON 2X2 STRL (MISCELLANEOUS) ×2
SURGILUBE 2OZ TUBE FLIPTOP (MISCELLANEOUS) ×3 IMPLANT
SUT ENDO VLOC 180-0-8IN (SUTURE) ×3 IMPLANT
SUT VIC AB 0 CT1 36 (SUTURE) ×12 IMPLANT
SUT VIC AB 2-0 UR6 27 (SUTURE) ×3 IMPLANT
SYR 50ML LL SCALE MARK (SYRINGE) ×3 IMPLANT
SYRINGE 10CC LL (SYRINGE) ×6 IMPLANT
TROCAR XCEL NON-BLD 5MMX100MML (ENDOMECHANICALS) ×3 IMPLANT
TUBING INSUFFLATOR HEATED (MISCELLANEOUS) ×3 IMPLANT

## 2015-10-03 NOTE — Transfer of Care (Signed)
Immediate Anesthesia Transfer of Care Note  Patient: Brandi Newton  Procedure(s) Performed: Procedure(s): HYSTERECTOMY TOTAL LAPAROSCOPIC, BILATERAL SALPINGOOPHERECTOMY (Bilateral)  Patient Location: PACU  Anesthesia Type:General  Level of Consciousness: patient cooperative and lethargic  Airway & Oxygen Therapy: Patient Spontanous Breathing and Patient connected to face mask oxygen  Post-op Assessment: Report given to RN and Post -op Vital signs reviewed and stable  Post vital signs: Reviewed and stable  Last Vitals:  Filed Vitals:   10/03/15 1512  BP: 146/84  Pulse: 86  Temp: 36.3 C  Resp: 21    Complications: No apparent anesthesia complications

## 2015-10-03 NOTE — Discharge Instructions (Signed)
Discharge instructions:   Call your doctor for increased pain or vaginal bleeding, temperature above 100.4, depression, or concerns.  No strenuous activity or heavy lifting for 6 weeks.  No intercourse, tampons, douching, or enemas for 6 weeks.  No tub baths-showers only.  No driving for 2 weeks or while taking pain medications.     You will likely experience some right rib and shoulder pain, this is not uncommon after a laparoscopic surgery and is due to trapped air.  This will dissolve in time, so please be patient.

## 2015-10-03 NOTE — Anesthesia Preprocedure Evaluation (Addendum)
Anesthesia Evaluation  Patient identified by MRN, date of birth, ID band Patient awake    Reviewed: Allergy & Precautions, NPO status , Patient's Chart, lab work & pertinent test results, reviewed documented beta blocker date and time   Airway Mallampati: II  TM Distance: >3 FB Neck ROM: Limited    Dental   Pulmonary former smoker,    Pulmonary exam normal        Cardiovascular hypertension,  Rhythm:Regular Rate:Normal     Neuro/Psych    GI/Hepatic Neg liver ROS, GERD  ,  Endo/Other  negative endocrine ROS  Renal/GU negative Renal ROS     Musculoskeletal  (+) Arthritis , Osteoarthritis,    Abdominal (+)  Abdomen: soft.    Peds negative pediatric ROS (+)  Hematology negative hematology ROS (+)   Anesthesia Other Findings   Reproductive/Obstetrics                            Anesthesia Physical Anesthesia Plan  ASA: II  Anesthesia Plan: General   Post-op Pain Management:    Induction: Intravenous  Airway Management Planned: Oral ETT and Video Laryngoscope Planned  Additional Equipment:   Intra-op Plan:   Post-operative Plan: Extubation in OR  Informed Consent: I have reviewed the patients History and Physical, chart, labs and discussed the procedure including the risks, benefits and alternatives for the proposed anesthesia with the patient or authorized representative who has indicated his/her understanding and acceptance.     Plan Discussed with: CRNA  Anesthesia Plan Comments:         Anesthesia Quick Evaluation

## 2015-10-03 NOTE — Discharge Summary (Signed)
Gynecology Physician Postoperative Discharge Summary  Patient ID: Brandi Newton MRN: [id number] DOB/AGE: Jan 08, 1946 69 y.o.  Admit Date: 10/03/2015 Discharge Date: 10/04/2015  Preoperative Diagnoses: complex ovarian mass Postoperative Diagnoses: complex bilateral ovarian masses.  Procedures: Procedure(s) (LRB): HYSTERECTOMY TOTAL LAPAROSCOPIC, BILATERAL SALPINGOOPHERECTOMY (Bilateral)  CBC Latest Ref Rng 10/04/2015 09/18/2015 08/28/2015  WBC 3.6 - 11.0 K/uL 13.1(H) 7.8 15.0(H)  Hemoglobin 12.0 - 16.0 g/dL 11.6(L) 13.0 14.3  Hematocrit 35.0 - 47.0 % 35.2 41.3 44.0  Platelets 150 - 440 K/uL 262 303 366    Hospital Course:  Brandi Newton is a 69 y.o. admitted for scheduled surgery.  She underwent the procedures as mentioned above, her operation was uncomplicated. For further details about surgery, please refer to the operative report. Patient had an uncomplicated postoperative course. By time of discharge on POD#1, her pain was controlled on oral pain medications; she was ambulating, voiding without difficulty, tolerating regular diet and passing flatus. She was deemed stable for discharge to home.   Discharge Exam: Blood pressure 129/75, pulse 73, temperature 98.5 F (36.9 C), temperature source Oral, resp. rate 20, height 5\' 7"  (1.702 m), weight 88.905 kg (196 lb), SpO2 98 %. General appearance: alert and no distress  Resp: clear to auscultation bilaterally, normal respiratory effort Cardio: regular rate and rhythm  GI: soft, non-tender; bowel sounds normal; no masses, no organomegaly.  Incisions: C/D/I, no erythema, no drainage noted, covered in surgical glue Pelvic: scant blood on pad  Extremities: extremities normal, atraumatic, no cyanosis or edema and Homans sign is negative, no sign of DVT  Discharged Condition: Stable  Disposition: 01-Home or Self Care      Discharge Instructions    Diet - low sodium heart healthy    Complete by:  As directed      Increase  activity slowly    Complete by:  As directed             Medication List    STOP taking these medications        HYDROcodone-acetaminophen 5-325 MG tablet  Commonly known as:  NORCO/VICODIN     polyethylene glycol-electrolytes 420 G solution  Commonly known as:  TRILYTE      TAKE these medications        acetaminophen 325 MG tablet  Commonly known as:  TYLENOL  Take 2 tablets (650 mg total) by mouth every 4 (four) hours.     cetirizine 10 MG tablet  Commonly known as:  ZYRTEC  Take 10 mg by mouth daily as needed for allergies.     fluticasone 50 MCG/ACT nasal spray  Commonly known as:  FLONASE  2 puffs each nostril daily as needed     hydrochlorothiazide 25 MG tablet  Commonly known as:  HYDRODIURIL  Take 25 mg by mouth daily.     ibuprofen 600 MG tablet  Commonly known as:  ADVIL,MOTRIN  Take 1 tablet (600 mg total) by mouth every 6 (six) hours as needed.     losartan-hydrochlorothiazide 50-12.5 MG tablet  Commonly known as:  HYZAAR  Take 1 tablet by mouth daily.     naproxen sodium 220 MG tablet  Commonly known as:  ANAPROX  Take 220 mg by mouth 2 (two) times daily as needed.     ondansetron 4 MG disintegrating tablet  Commonly known as:  ZOFRAN ODT  Take 1 tablet (4 mg total) by mouth every 4 (four) hours as needed for nausea or vomiting.     ondansetron 4 MG tablet  Commonly known as:  ZOFRAN  Take 1 tablet (4 mg total) by mouth daily as needed for nausea or vomiting.     polyethylene glycol packet  Commonly known as:  MIRALAX / GLYCOLAX  Take 17 g by mouth daily.     PROAIR HFA 108 (90 BASE) MCG/ACT inhaler  Generic drug:  albuterol  Inhale 2 puffs into the lungs every 6 (six) hours as needed for wheezing.     traMADol 50 MG tablet  Commonly known as:  ULTRAM  Take 1 tablet (50 mg total) by mouth every 6 (six) hours as needed.     vitamin B-12 1000 MCG tablet  Commonly known as:  CYANOCOBALAMIN  Take 1,000 mcg by mouth daily.     VITAMIN  B-12 PO  Take 5,000 mcg by mouth daily.     Vitamin D3 5000 UNITS Tabs  Take 1 tablet by mouth daily.       Follow-up Information    Follow up with Melanie Openshaw, Honor Loh, MD.   Specialty:  Obstetrics and Gynecology   Why:  for first post op visit   Contact information:   [place] [address] [phone]       Follow up with Kloey Cazarez, Honor Loh, MD.   Specialty:  Obstetrics and Gynecology   Why:  for comprehensive post op visit   Contact information:   [place] [address] [place] [address] [phone]       Signed:  Keili Hasten, Honor Loh Attending Obstetrician & Gynecologist [facility]

## 2015-10-03 NOTE — Anesthesia Postprocedure Evaluation (Signed)
  Anesthesia Post-op Note  Patient: Brandi Newton  Procedure(s) Performed: Procedure(s): HYSTERECTOMY TOTAL LAPAROSCOPIC, BILATERAL SALPINGOOPHERECTOMY (Bilateral)  Anesthesia type:General  Patient location: PACU  Post pain: Pain level controlled  Post assessment: Post-op Vital signs reviewed, Patient's Cardiovascular Status Stable, Respiratory Function Stable, Patent Airway and No signs of Nausea or vomiting  Post vital signs: Reviewed and stable  Last Vitals:  Filed Vitals:   10/03/15 1653  BP: 164/75  Pulse: 87  Temp: 36.4 C  Resp:     Level of consciousness: awake, alert  and patient cooperative  Complications: No apparent anesthesia complications

## 2015-10-03 NOTE — Anesthesia Procedure Notes (Signed)
Procedure Name: Intubation Date/Time: 10/03/2015 12:48 PM Performed by: Jonna Clark Pre-anesthesia Checklist: Patient identified, Patient being monitored, Timeout performed, Emergency Drugs available and Suction available Patient Re-evaluated:Patient Re-evaluated prior to inductionOxygen Delivery Method: Circle system utilized Preoxygenation: Pre-oxygenation with 100% oxygen Intubation Type: IV induction Ventilation: Mask ventilation without difficulty Laryngoscope Size: 3 and Glidescope Grade View: Grade I Tube type: Oral Tube size: 7.0 mm Number of attempts: 1 Airway Equipment and Method: Stylet Placement Confirmation: ETT inserted through vocal cords under direct vision,  positive ETCO2 and breath sounds checked- equal and bilateral Secured at: 21 cm Tube secured with: Tape Dental Injury: Teeth and Oropharynx as per pre-operative assessment

## 2015-10-03 NOTE — Op Note (Signed)
Total Laparoscopic Hysterectomy Operative Note Procedure Date: 10/03/2015  Patient:  Brandi Newton  69 y.o. female  PRE-OPERATIVE DIAGNOSIS:  Complex adnexal mass  POST-OPERATIVE DIAGNOSIS:  bilateral ovarian  masses  PROCEDURE:  Procedure(s): HYSTERECTOMY TOTAL LAPAROSCOPIC, BILATERAL SALPINGOOPHERECTOMY (Bilateral)  SURGEON:  Surgeon(s) and Role:    * Honor Loh Ward, MD - Primary      Prentice Docker, MD - Assist  ANESTHESIA:  General via ET  I/O  Total I/O In: 800 [I.V.:800] Out: 350 [Urine:300; Blood:50]  FINDINGS:  Small uterus, normal appearing fallopian tubes bilaterally.  Left ovary with vessicles on surface, right ovary with large cyst.  Normal upper abdomen.  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  COMPLICATIONS: none apparent  DISPOSITION: vital signs stable to PACU  Indication for Surgery: 69 y.o. who presented to the ED with complaints of abodminal pain.  CT showed large stool burden, no ascites, but complex adnexal mass.  CA 125 was 31.  She followed up with me as an outpatient, and I recommended removal of ovaries due to cannot rule out cancer.    Risks of surgery were discussed with the patient including but not limited to: bleeding which may require transfusion or reoperation; infection which may require antibiotics; injury to bowel, bladder, ureters or other surrounding organs; need for additional procedures including laparotomy, blood clot, incisional problems and other postoperative/anesthesia complications. Written informed consent was obtained.      PROCEDURE IN DETAIL:  The patient had 5000u Heparin Sub-q and sequential compression devices applied to her lower extremities while in the preoperative area.  She was then taken to the operating room where general  anesthesia was administered and was found to be adequate.  She was placed in the dorsal lithotomy position, and was prepped and draped in a sterile manner. A surgical time-out was performed.  A Foley catheter was inserted into her bladder and attached to constant drainage and a V-Care uterine manipulator was then advanced into the uterus and a good fit around the cervix was noted. The gloves were changed, and attention was turned to the abdomen where an umbilical incision was made with the scalpel. An open Sheryle Hail entry was performed and a 44mm balloon trochar was placed.  The abdomen was insufflated to 15mg Hg carbon dioxide gas and adequate pneumoperitoneum was obtained. A survey of the patient's pelvis and abdomen revealed the findings as mentioned above. Two 72mm ports were inserted in the lower left and right quadrants under visualization.    Pelvic washing were obtained.  The bilateral round ligaments were transected and the peritoneum stretched medially to visualize the bilateral ureters.  The bilateral IP ligaments were skeletonized and doubly burned and transected using the ligasure.  The proximal ends were hemostatic.  The bilateral utero-ovarian ligaments and cornua were ligated and transected and each ovary/tube complex was placed in an endocatch bag.  The anterior broad ligament was divided and brought across the uterus to separate the vesicouterine peritoneum and create a bladder flap. The bladder was pushed away from the uterus.The bilateral uterine arteries were ligated and transected. The bilateral uterosacral and cardinal ligaments were ligated and transected. A colpotomy was made around the V-Care cervical cup and the uterus, cervix, and bilateral tubes were removed through the vagina. The two bags containing the ovaries were then passed through the vagina and sent  to pathology for frozen section. The vaginal cuff was closed using a V-lock suture  in a running and intermittently locking  stitch via the endostitch device. This was tested for integrity using the surgeon's finger. After a change of gloves, the pneumoperitoneum was deflated and recreated and surgical site inspected, and found to be hemostatic. Bilateral ureters were visualized vermiuclating. No intraoperative injury to surrounding organs was noted. The abdomen was desufflated and all instruments were then removed.   The umbilical fascia was closed using 2-0 vicryl.  All skin incisions were closed with 4-0 monocryl and covered with surgical glue. The patient tolerated the procedures well.  All instruments, needles, and sponge counts were correct x 2. The patient was taken to the recovery room in stable condition.   ---- Larey Days, MD Attending Obstetrician and Gynecologist [facility]

## 2015-10-04 ENCOUNTER — Encounter: Payer: Self-pay | Admitting: Obstetrics & Gynecology

## 2015-10-04 DIAGNOSIS — N841 Polyp of cervix uteri: Secondary | ICD-10-CM | POA: Diagnosis not present

## 2015-10-04 LAB — CBC
HCT: 35.2 % (ref 35.0–47.0)
Hemoglobin: 11.6 g/dL — ABNORMAL LOW (ref 12.0–16.0)
MCH: 28.2 pg (ref 26.0–34.0)
MCHC: 33 g/dL (ref 32.0–36.0)
MCV: 85.4 fL (ref 80.0–100.0)
PLATELETS: 262 10*3/uL (ref 150–440)
RBC: 4.13 MIL/uL (ref 3.80–5.20)
RDW: 14.8 % — ABNORMAL HIGH (ref 11.5–14.5)
WBC: 13.1 10*3/uL — AB (ref 3.6–11.0)

## 2015-10-04 LAB — BASIC METABOLIC PANEL
ANION GAP: 7 (ref 5–15)
BUN: 19 mg/dL (ref 6–20)
CALCIUM: 9.4 mg/dL (ref 8.9–10.3)
CO2: 24 mmol/L (ref 22–32)
Chloride: 104 mmol/L (ref 101–111)
Creatinine, Ser: 1.04 mg/dL — ABNORMAL HIGH (ref 0.44–1.00)
GFR, EST NON AFRICAN AMERICAN: 54 mL/min — AB (ref >60)
GLUCOSE: 163 mg/dL — AB (ref 65–99)
POTASSIUM: 4 mmol/L (ref 3.5–5.1)
SODIUM: 135 mmol/L (ref 135–145)

## 2015-10-04 MED ORDER — ACETAMINOPHEN 325 MG PO TABS: 650.0000 mg | ORAL_TABLET | ORAL | Status: DC

## 2015-10-04 NOTE — OR Nursing (Signed)
PPD test result;  Site (right forearm) was read  NEGATIVE BY C Adalynne Steffensmeier prior to surgery.

## 2015-10-04 NOTE — Progress Notes (Signed)
Patient discharged home. Discharge instructionsand follow up appointment given to and reviewed with patient. Patient states she received prescriptions prior to surgery but prescriptions were reviewed with her. Patient verbalized understanding. Escorted out by auxillary.

## 2015-10-08 LAB — CYTOLOGY - NON PAP

## 2015-10-08 LAB — SURGICAL PATHOLOGY

## 2016-10-09 IMAGING — MG MM DIGITAL SCREENING BILAT W/ CAD
4 series · 4 of 4 positions shown · non-contrast
Comparison: Previous exam(s).

CLINICAL DATA: Screening.

EXAM:
DIGITAL SCREENING BILATERAL MAMMOGRAM WITH CAD

[R CC]
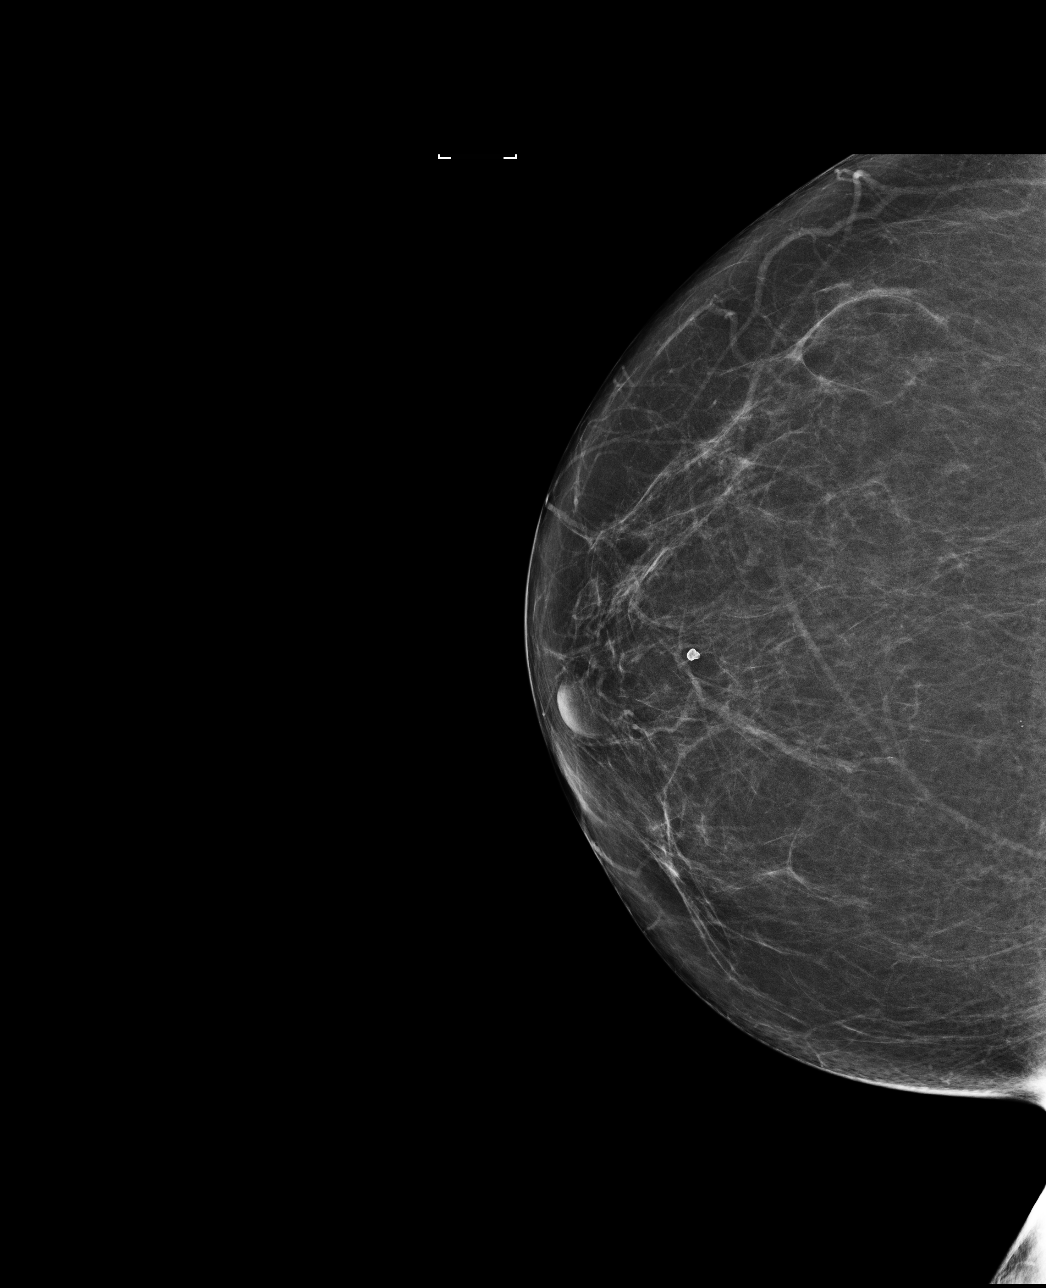

[R MLO]
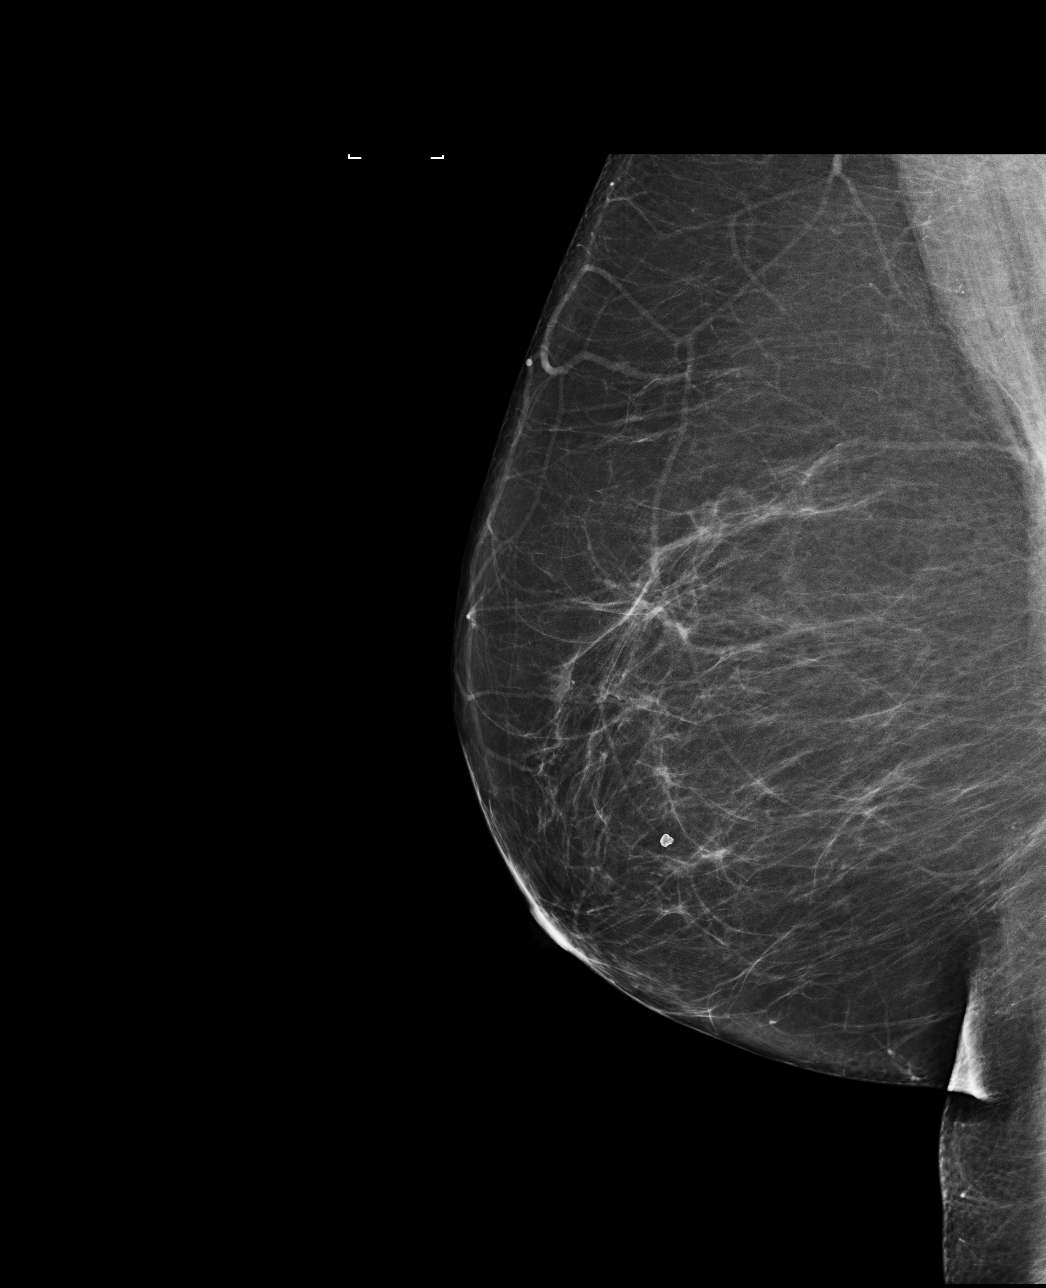

[L MLO]
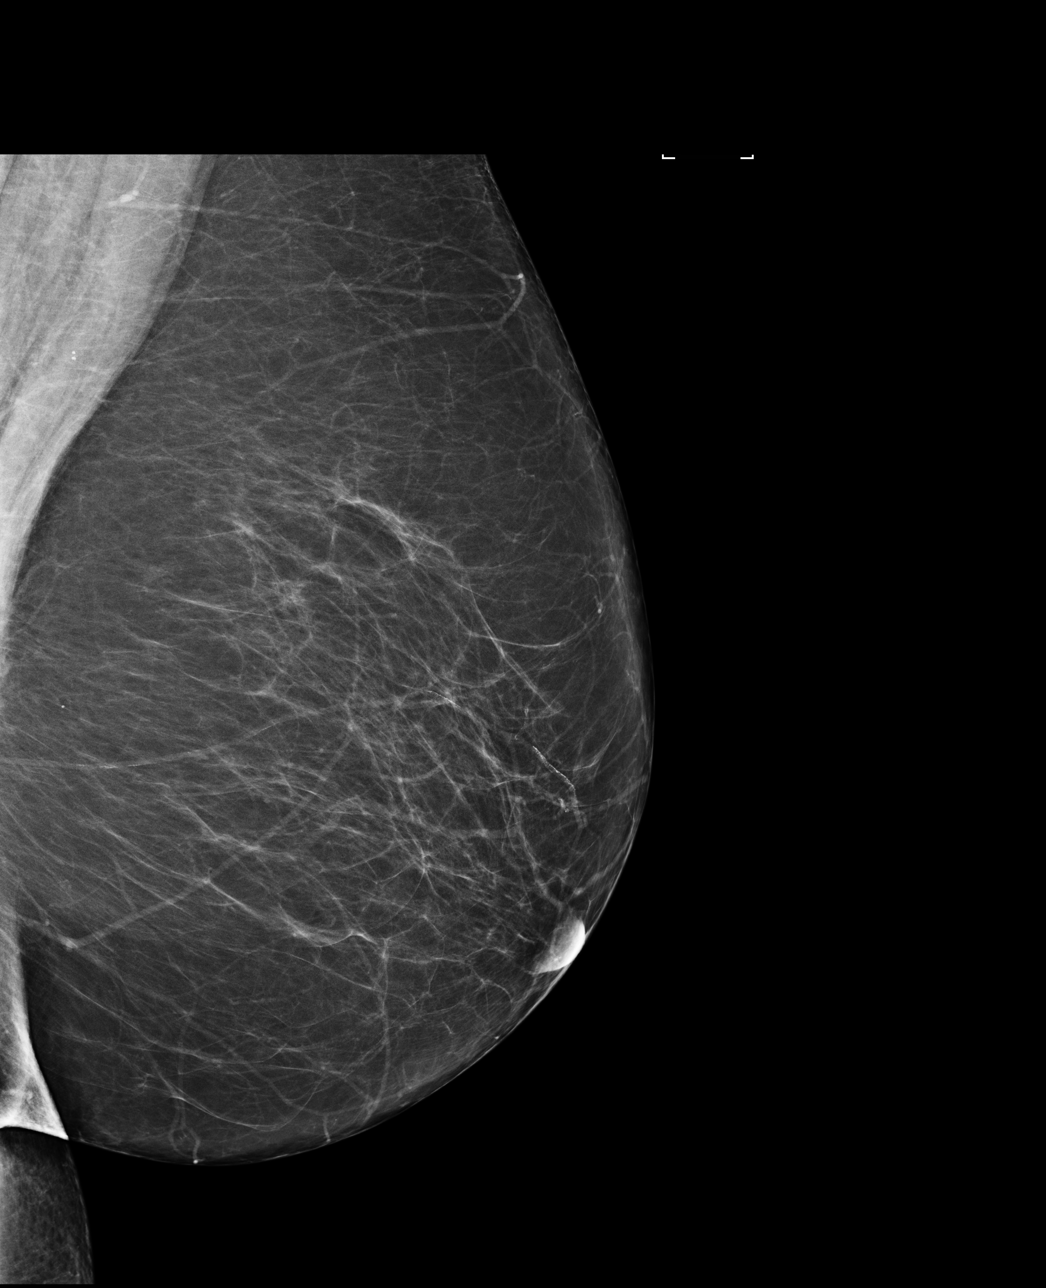

[L CC]
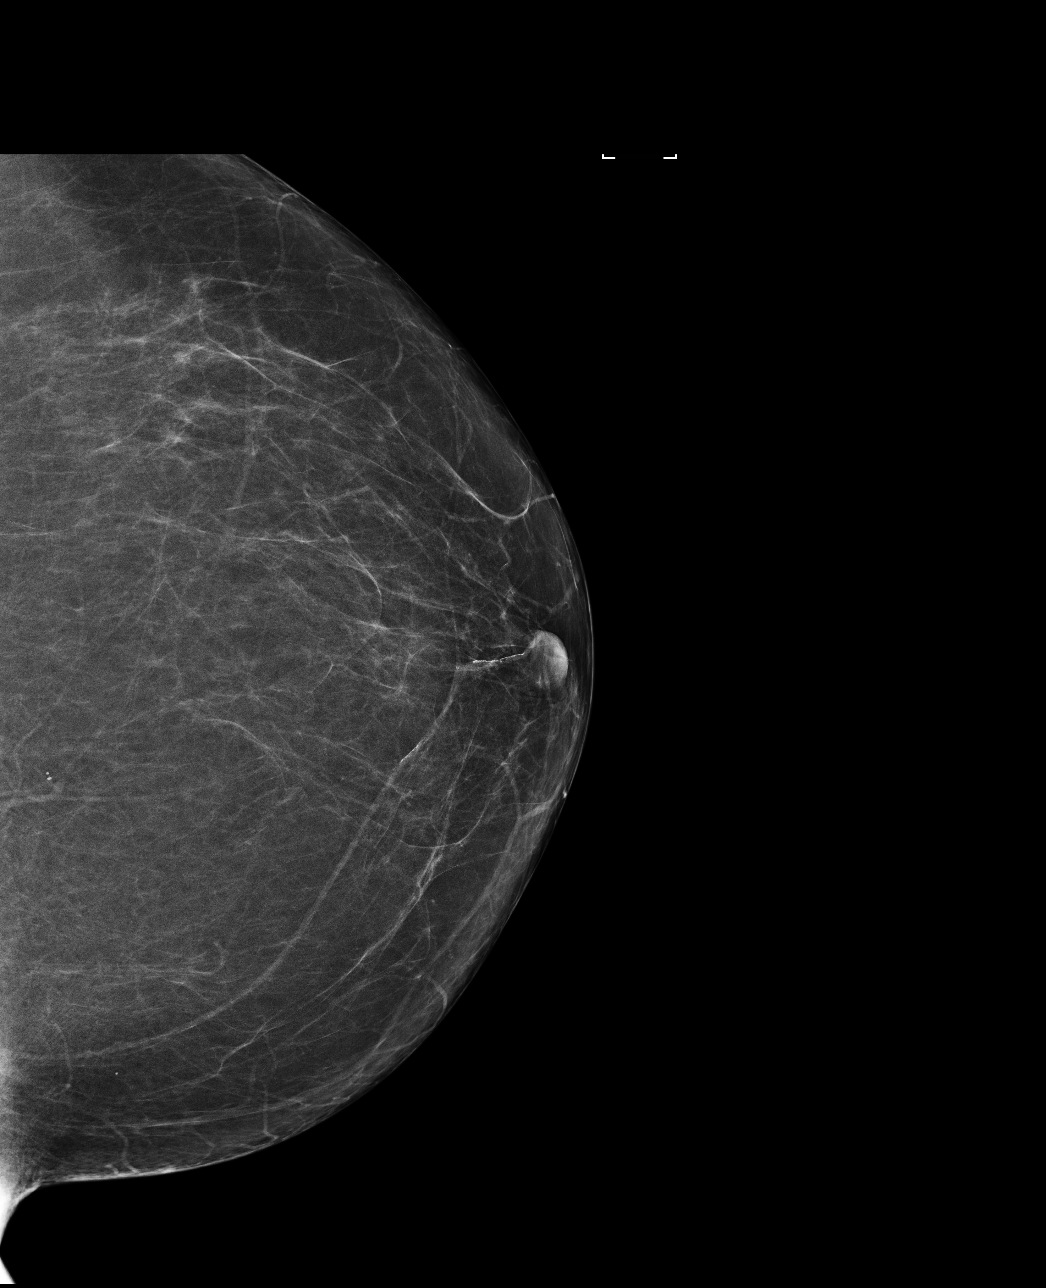

[4 of 4 positions shown; findings below may reference images not displayed]

ACR Breast Density Category b: There are scattered areas of
fibroglandular density.
FINDINGS: There are no findings suspicious for malignancy. Images were
processed with CAD.
IMPRESSION: No mammographic evidence of malignancy. A result letter of this
screening mammogram will be mailed directly to the patient.

RECOMMENDATION:
Screening mammogram in one year. (Code:AS-G-LCT)

BI-RADS CATEGORY  1: Negative.

## 2017-06-01 ENCOUNTER — Other Ambulatory Visit: Payer: Self-pay | Admitting: Family Medicine

## 2017-06-01 DIAGNOSIS — Z1231 Encounter for screening mammogram for malignant neoplasm of breast: Secondary | ICD-10-CM

## 2017-06-10 ENCOUNTER — Ambulatory Visit
Admission: RE | Admit: 2017-06-10 | Discharge: 2017-06-10 | Disposition: A | Discharge status: Home / Self Care | Payer: Medicare Other | Source: Ambulatory Visit | Attending: Family Medicine | Admitting: Family Medicine

## 2017-06-10 DIAGNOSIS — Z1231 Encounter for screening mammogram for malignant neoplasm of breast: Secondary | ICD-10-CM | POA: Diagnosis present

## 2018-01-13 ENCOUNTER — Other Ambulatory Visit: Payer: Self-pay | Admitting: Student

## 2018-01-13 DIAGNOSIS — M5416 Radiculopathy, lumbar region: Secondary | ICD-10-CM

## 2018-01-13 DIAGNOSIS — M5412 Radiculopathy, cervical region: Secondary | ICD-10-CM

## 2018-01-21 ENCOUNTER — Ambulatory Visit: Payer: Medicare Other

## 2018-01-21 ENCOUNTER — Ambulatory Visit
Admission: RE | Admit: 2018-01-21 | Discharge: 2018-01-21 | Disposition: A | Discharge status: Home / Self Care | Payer: Medicare Other | Source: Ambulatory Visit | Attending: Student | Admitting: Student

## 2018-01-21 DIAGNOSIS — M5116 Intervertebral disc disorders with radiculopathy, lumbar region: Secondary | ICD-10-CM | POA: Diagnosis not present

## 2018-01-21 DIAGNOSIS — M5416 Radiculopathy, lumbar region: Secondary | ICD-10-CM

## 2018-01-21 DIAGNOSIS — G9589 Other specified diseases of spinal cord: Secondary | ICD-10-CM | POA: Insufficient documentation

## 2018-01-21 DIAGNOSIS — M4802 Spinal stenosis, cervical region: Secondary | ICD-10-CM | POA: Insufficient documentation

## 2018-01-21 DIAGNOSIS — M5412 Radiculopathy, cervical region: Secondary | ICD-10-CM

## 2018-01-21 DIAGNOSIS — M48061 Spinal stenosis, lumbar region without neurogenic claudication: Secondary | ICD-10-CM | POA: Insufficient documentation

## 2018-01-21 DIAGNOSIS — M4722 Other spondylosis with radiculopathy, cervical region: Secondary | ICD-10-CM | POA: Diagnosis not present

## 2018-01-24 DIAGNOSIS — G5603 Carpal tunnel syndrome, bilateral upper limbs: Secondary | ICD-10-CM | POA: Insufficient documentation

## 2018-01-26 DIAGNOSIS — M5416 Radiculopathy, lumbar region: Secondary | ICD-10-CM | POA: Insufficient documentation

## 2018-01-31 ENCOUNTER — Other Ambulatory Visit: Payer: Self-pay | Admitting: Student

## 2018-02-01 ENCOUNTER — Other Ambulatory Visit: Payer: Self-pay | Admitting: Student

## 2018-02-01 DIAGNOSIS — M5412 Radiculopathy, cervical region: Secondary | ICD-10-CM

## 2018-02-01 DIAGNOSIS — M5416 Radiculopathy, lumbar region: Secondary | ICD-10-CM

## 2018-02-14 ENCOUNTER — Ambulatory Visit
Admission: RE | Admit: 2018-02-14 | Discharge: 2018-02-14 | Disposition: A | Discharge status: Home / Self Care | Payer: Medicare Other | Source: Ambulatory Visit | Attending: Student | Admitting: Student

## 2018-02-14 DIAGNOSIS — M5412 Radiculopathy, cervical region: Secondary | ICD-10-CM

## 2018-02-14 MED ORDER — IOPAMIDOL (ISOVUE-M 300) INJECTION 61%
1.0000 mL | Freq: Once | INTRAMUSCULAR | Status: AC | PRN
  Administered 2018-02-14: 1 mL via EPIDURAL

## 2018-02-14 MED ORDER — TRIAMCINOLONE ACETONIDE 40 MG/ML IJ SUSP (RADIOLOGY)
60.0000 mg | Freq: Once | INTRAMUSCULAR | Status: AC
  Administered 2018-02-14: 60 mg via EPIDURAL

## 2018-02-14 NOTE — Discharge Instructions (Signed)

## 2018-02-28 ENCOUNTER — Other Ambulatory Visit: Payer: Medicare Other

## 2018-03-04 ENCOUNTER — Other Ambulatory Visit: Payer: Self-pay | Admitting: Student

## 2018-03-04 ENCOUNTER — Ambulatory Visit
Admission: RE | Admit: 2018-03-04 | Discharge: 2018-03-04 | Disposition: A | Discharge status: Home / Self Care | Payer: Medicare Other | Source: Ambulatory Visit | Attending: Student | Admitting: Student

## 2018-03-04 DIAGNOSIS — M4722 Other spondylosis with radiculopathy, cervical region: Secondary | ICD-10-CM

## 2018-03-04 DIAGNOSIS — M5416 Radiculopathy, lumbar region: Secondary | ICD-10-CM

## 2018-03-04 MED ORDER — METHYLPREDNISOLONE ACETATE 40 MG/ML INJ SUSP (RADIOLOG
120.0000 mg | Freq: Once | INTRAMUSCULAR | Status: AC
  Administered 2018-03-04: 120 mg via EPIDURAL

## 2018-03-04 MED ORDER — IOPAMIDOL (ISOVUE-M 200) INJECTION 41%
1.0000 mL | Freq: Once | INTRAMUSCULAR | Status: AC
  Administered 2018-03-04: 1 mL via EPIDURAL

## 2018-03-04 NOTE — Discharge Instructions (Signed)

## 2018-03-29 ENCOUNTER — Ambulatory Visit
Admission: RE | Admit: 2018-03-29 | Discharge: 2018-03-29 | Disposition: A | Discharge status: Home / Self Care | Payer: Medicare Other | Source: Ambulatory Visit | Attending: Student | Admitting: Student

## 2018-03-29 DIAGNOSIS — M4722 Other spondylosis with radiculopathy, cervical region: Secondary | ICD-10-CM

## 2018-03-29 MED ORDER — IOPAMIDOL (ISOVUE-M 300) INJECTION 61%
1.0000 mL | Freq: Once | INTRAMUSCULAR | Status: AC | PRN
  Administered 2018-03-29: 1 mL via EPIDURAL

## 2018-03-29 MED ORDER — TRIAMCINOLONE ACETONIDE 40 MG/ML IJ SUSP (RADIOLOGY)
60.0000 mg | Freq: Once | INTRAMUSCULAR | Status: AC
  Administered 2018-03-29: 60 mg via EPIDURAL

## 2018-03-29 NOTE — Discharge Instructions (Signed)

## 2018-04-21 DIAGNOSIS — G959 Disease of spinal cord, unspecified: Secondary | ICD-10-CM | POA: Insufficient documentation

## 2018-05-03 ENCOUNTER — Encounter
Admission: RE | Admit: 2018-05-03 | Discharge: 2018-05-03 | Disposition: A | Discharge status: Home / Self Care | Payer: Medicare Other | Source: Ambulatory Visit | Attending: Neurosurgery | Admitting: Neurosurgery

## 2018-05-03 ENCOUNTER — Other Ambulatory Visit: Payer: Self-pay

## 2018-05-03 DIAGNOSIS — Z01810 Encounter for preprocedural cardiovascular examination: Secondary | ICD-10-CM | POA: Insufficient documentation

## 2018-05-03 DIAGNOSIS — Z01812 Encounter for preprocedural laboratory examination: Secondary | ICD-10-CM | POA: Diagnosis present

## 2018-05-03 HISTORY — DX: Personal history of urinary calculi: Z87.442

## 2018-05-03 HISTORY — DX: Family history of other specified conditions: Z84.89

## 2018-05-03 HISTORY — DX: Gastro-esophageal reflux disease without esophagitis: K21.9

## 2018-05-03 LAB — URINALYSIS, ROUTINE W REFLEX MICROSCOPIC
Bilirubin Urine: NEGATIVE
Glucose, UA: NEGATIVE mg/dL
Hgb urine dipstick: NEGATIVE
Ketones, ur: NEGATIVE mg/dL
Leukocytes, UA: NEGATIVE
Nitrite: NEGATIVE
Protein, ur: NEGATIVE mg/dL
Specific Gravity, Urine: 1.013 (ref 1.005–1.030)
pH: 6 (ref 5.0–8.0)

## 2018-05-03 LAB — DIFFERENTIAL
BASOS PCT: 1 %
Basophils Absolute: 0.1 10*3/uL (ref 0–0.1)
EOS ABS: 0.3 10*3/uL (ref 0–0.7)
EOS PCT: 3 %
Lymphocytes Relative: 22 %
Lymphs Abs: 2.1 10*3/uL (ref 1.0–3.6)
MONO ABS: 0.7 10*3/uL (ref 0.2–0.9)
MONOS PCT: 7 %
Neutro Abs: 6.6 10*3/uL — ABNORMAL HIGH (ref 1.4–6.5)
Neutrophils Relative %: 67 %

## 2018-05-03 LAB — TYPE AND SCREEN
ABO/RH(D): A POS
ANTIBODY SCREEN: NEGATIVE

## 2018-05-03 LAB — CBC
HCT: 41.4 % (ref 35.0–47.0)
Hemoglobin: 13.7 g/dL (ref 12.0–16.0)
MCH: 30.5 pg (ref 26.0–34.0)
MCHC: 33.1 g/dL (ref 32.0–36.0)
MCV: 92.2 fL (ref 80.0–100.0)
Platelets: 274 10*3/uL (ref 150–440)
RBC: 4.48 MIL/uL (ref 3.80–5.20)
RDW: 16.8 % — AB (ref 11.5–14.5)
WBC: 9.8 10*3/uL (ref 3.6–11.0)

## 2018-05-03 LAB — BASIC METABOLIC PANEL WITH GFR
Anion gap: 8 (ref 5–15)
BUN: 18 mg/dL (ref 6–20)
CO2: 26 mmol/L (ref 22–32)
Calcium: 10.5 mg/dL — ABNORMAL HIGH (ref 8.9–10.3)
Chloride: 104 mmol/L (ref 101–111)
Creatinine, Ser: 1.2 mg/dL — ABNORMAL HIGH (ref 0.44–1.00)
GFR calc Af Amer: 52 mL/min — ABNORMAL LOW
GFR calc non Af Amer: 45 mL/min — ABNORMAL LOW
Glucose, Bld: 90 mg/dL (ref 65–99)
Potassium: 4.3 mmol/L (ref 3.5–5.1)
Sodium: 138 mmol/L (ref 135–145)

## 2018-05-03 LAB — SURGICAL PCR SCREEN
MRSA, PCR: NEGATIVE
STAPHYLOCOCCUS AUREUS: NEGATIVE

## 2018-05-03 LAB — PROTIME-INR
INR: 0.99
Prothrombin Time: 13 seconds (ref 11.4–15.2)

## 2018-05-03 LAB — APTT: aPTT: 28 seconds (ref 24–36)

## 2018-05-03 NOTE — Patient Instructions (Signed)
Your procedure is scheduled on: Wednesday 05/11/18 Report to [facility]. To find out your arrival time please call [phone] between 1PM - 3PM on Tuesday 05/10/18.  Remember: Instructions that are not followed completely may result in serious medical risk, up to and including death, or upon the discretion of your surgeon and anesthesiologist your surgery may need to be rescheduled.     _X__ 1. Do not eat food after midnight the night before your procedure.                 No gum chewing or hard candies. You may drink clear liquids up to 2 hours                 before you are scheduled to arrive for your surgery- DO not drink clear                 liquids within 2 hours of the start of your surgery.                 Clear Liquids include:  water, apple juice without pulp, clear carbohydrate                 drink such as Clearfast or Gatorade, Black Coffee or Tea (Do not add                 anything to coffee or tea).  __X__2.  On the morning of surgery brush your teeth with toothpaste and water, you                 may rinse your mouth with mouthwash if you wish.  Do not swallow any              toothpaste of mouthwash.     _X__ 3.  No Alcohol for 24 hours before or after surgery.   _X__ 4.  Do Not Smoke or use e-cigarettes For 24 Hours Prior to Your Surgery.                 Do not use any chewable tobacco products for at least 6 hours prior to                 surgery.  ____  5.  Bring all medications with you on the day of surgery if instructed.   __X__  6.  Notify your doctor if there is any change in your medical condition      (cold, fever, infections).     Do not wear jewelry, make-up, hairpins, clips or nail polish. Do not wear lotions, powders, or perfumes.  Do not shave 48 hours prior to surgery. Men may shave face and neck. Do not bring valuables to the hospital.    [facility] is not responsible for any belongings or  valuables.  Contacts, dentures/partials or body piercings may not be worn into surgery. Bring a case for your contacts, glasses or hearing aids, a denture cup will be supplied. Leave your suitcase in the car. After surgery it may be brought to your room. For patients admitted to the hospital, discharge time is determined by your treatment team.   Patients discharged the day of surgery will not be allowed to drive home.   Please read over the following fact sheets that you were given:   MRSA Information  __X__ Take these medicines the morning of surgery with A SIP OF WATER:  1. Escitalopram  2. Omeprazole at bedtime and again the morning of prcedure  3. May take cetirizine if needed  4.  5.  6.  ____ Fleet Enema (as directed)   __X__ Use CHG Soap/SAGE wipes as directed  __X__ Use inhalers on the day of surgery whether you need it or not.  ____ Stop metformin/Janumet/Farxiga 2 days prior to surgery    ____ Take 1/2 of usual insulin dose the night before surgery. No insulin the morning          of surgery.   __X__ Stop Blood Thinners Coumadin/Plavix/Xarelto/Pleta/Pradaxa/Eliquis/Effient/Aspirin  on TODAY  Or contact your Surgeon, Cardiologist or Medical Doctor regarding  ability to stop your blood thinners  __X__ Stop Anti-inflammatories 7 days before surgery such as Advil, Ibuprofen, Motrin,  BC or Goodies Powder, Naprosyn, Naproxen, Aleve, Aspirin TODAY, MAY USE TYLENOL   __X__ Stop all herbal supplements, fish oil or vitamin E until after surgery.  STOP MELATONIN today  ____ Bring C-Pap to the hospital.

## 2018-05-03 NOTE — Pre-Procedure Instructions (Signed)
Copy of EKG faxed to Dr [facility] office for as requested by staff at Dr Annamary Rummage.

## 2018-05-10 MED ORDER — CEFAZOLIN SODIUM-DEXTROSE 2-4 GM/100ML-% IV SOLN
2.0000 g | Freq: Once | INTRAVENOUS | Status: AC
  Administered 2018-05-11: 2 g via INTRAVENOUS

## 2018-05-11 ENCOUNTER — Encounter
Admission: RE | Disposition: A | Discharge status: Home / Self Care | Payer: Self-pay | Source: Ambulatory Visit | Attending: Neurosurgery

## 2018-05-11 ENCOUNTER — Observation Stay: Payer: Medicare Other

## 2018-05-11 ENCOUNTER — Other Ambulatory Visit: Payer: Self-pay

## 2018-05-11 ENCOUNTER — Ambulatory Visit: Payer: Medicare Other | Admitting: Anesthesiology

## 2018-05-11 ENCOUNTER — Observation Stay
Admission: RE | Admit: 2018-05-11 | Discharge: 2018-05-12 | Disposition: A | Discharge status: Home / Self Care | Payer: Medicare Other | Source: Ambulatory Visit | Attending: Neurosurgery | Admitting: Neurosurgery

## 2018-05-11 ENCOUNTER — Ambulatory Visit: Payer: Medicare Other

## 2018-05-11 DIAGNOSIS — K219 Gastro-esophageal reflux disease without esophagitis: Secondary | ICD-10-CM | POA: Insufficient documentation

## 2018-05-11 DIAGNOSIS — J45909 Unspecified asthma, uncomplicated: Secondary | ICD-10-CM | POA: Insufficient documentation

## 2018-05-11 DIAGNOSIS — N83201 Unspecified ovarian cyst, right side: Secondary | ICD-10-CM | POA: Diagnosis not present

## 2018-05-11 DIAGNOSIS — Z419 Encounter for procedure for purposes other than remedying health state, unspecified: Secondary | ICD-10-CM

## 2018-05-11 DIAGNOSIS — Z9842 Cataract extraction status, left eye: Secondary | ICD-10-CM | POA: Insufficient documentation

## 2018-05-11 DIAGNOSIS — Z79899 Other long term (current) drug therapy: Secondary | ICD-10-CM | POA: Insufficient documentation

## 2018-05-11 DIAGNOSIS — I10 Essential (primary) hypertension: Secondary | ICD-10-CM | POA: Insufficient documentation

## 2018-05-11 DIAGNOSIS — Z87891 Personal history of nicotine dependence: Secondary | ICD-10-CM | POA: Diagnosis not present

## 2018-05-11 DIAGNOSIS — Z8249 Family history of ischemic heart disease and other diseases of the circulatory system: Secondary | ICD-10-CM | POA: Insufficient documentation

## 2018-05-11 DIAGNOSIS — M4712 Other spondylosis with myelopathy, cervical region: Secondary | ICD-10-CM | POA: Diagnosis not present

## 2018-05-11 DIAGNOSIS — G709 Myoneural disorder, unspecified: Secondary | ICD-10-CM | POA: Insufficient documentation

## 2018-05-11 DIAGNOSIS — Z7951 Long term (current) use of inhaled steroids: Secondary | ICD-10-CM | POA: Insufficient documentation

## 2018-05-11 DIAGNOSIS — T7840XA Allergy, unspecified, initial encounter: Secondary | ICD-10-CM | POA: Insufficient documentation

## 2018-05-11 DIAGNOSIS — M4722 Other spondylosis with radiculopathy, cervical region: Secondary | ICD-10-CM | POA: Diagnosis present

## 2018-05-11 DIAGNOSIS — M1990 Unspecified osteoarthritis, unspecified site: Secondary | ICD-10-CM | POA: Insufficient documentation

## 2018-05-11 DIAGNOSIS — M5412 Radiculopathy, cervical region: Secondary | ICD-10-CM | POA: Diagnosis present

## 2018-05-11 DIAGNOSIS — Z9841 Cataract extraction status, right eye: Secondary | ICD-10-CM | POA: Insufficient documentation

## 2018-05-11 DIAGNOSIS — Z981 Arthrodesis status: Secondary | ICD-10-CM

## 2018-05-11 DIAGNOSIS — N83202 Unspecified ovarian cyst, left side: Secondary | ICD-10-CM | POA: Diagnosis not present

## 2018-05-11 DIAGNOSIS — Z87442 Personal history of urinary calculi: Secondary | ICD-10-CM | POA: Diagnosis not present

## 2018-05-11 DIAGNOSIS — Z809 Family history of malignant neoplasm, unspecified: Secondary | ICD-10-CM | POA: Diagnosis not present

## 2018-05-11 DIAGNOSIS — F329 Major depressive disorder, single episode, unspecified: Secondary | ICD-10-CM | POA: Diagnosis not present

## 2018-05-11 DIAGNOSIS — X58XXXA Exposure to other specified factors, initial encounter: Secondary | ICD-10-CM | POA: Insufficient documentation

## 2018-05-11 DIAGNOSIS — Z90710 Acquired absence of both cervix and uterus: Secondary | ICD-10-CM | POA: Insufficient documentation

## 2018-05-11 DIAGNOSIS — K5900 Constipation, unspecified: Secondary | ICD-10-CM | POA: Diagnosis not present

## 2018-05-11 HISTORY — PX: ANTERIOR CERVICAL DECOMP/DISCECTOMY FUSION: SHX1161

## 2018-05-11 SURGERY — ANTERIOR CERVICAL DECOMPRESSION/DISCECTOMY FUSION 3 LEVELS
Anesthesia: General

## 2018-05-11 MED ORDER — ACETAMINOPHEN 650 MG RE SUPP: 650.0000 mg | RECTAL | Status: DC | PRN

## 2018-05-11 MED ORDER — PANTOPRAZOLE SODIUM 40 MG PO TBEC
40.0000 mg | DELAYED_RELEASE_TABLET | Freq: Every day | ORAL | Status: DC
Start: 2018-05-12 — End: 2018-05-12
  Administered 2018-05-12: 40 mg via ORAL
  Filled 2018-05-11: qty 1

## 2018-05-11 MED ORDER — LIDOCAINE HCL (PF) 2 % IJ SOLN
INTRAMUSCULAR | Status: AC
  Filled 2018-05-11: qty 10

## 2018-05-11 MED ORDER — ONDANSETRON HCL 4 MG/2ML IJ SOLN
INTRAMUSCULAR | Status: DC | PRN
  Administered 2018-05-11: 4 mg via INTRAVENOUS

## 2018-05-11 MED ORDER — OXYCODONE HCL 5 MG PO TABS
10.0000 mg | ORAL_TABLET | ORAL | Status: DC | PRN
  Administered 2018-05-11: 10 mg via ORAL
  Filled 2018-05-11: qty 2

## 2018-05-11 MED ORDER — METHOCARBAMOL 1000 MG/10ML IJ SOLN
500.0000 mg | Freq: Four times a day (QID) | INTRAVENOUS | Status: DC | PRN
  Filled 2018-05-11: qty 5

## 2018-05-11 MED ORDER — FENTANYL CITRATE (PF) 100 MCG/2ML IJ SOLN
INTRAMUSCULAR | Status: AC
  Filled 2018-05-11: qty 2

## 2018-05-11 MED ORDER — HYDROMORPHONE HCL 1 MG/ML IJ SOLN
INTRAMUSCULAR | Status: AC
  Administered 2018-05-11: 0.25 mg via INTRAVENOUS
  Filled 2018-05-11: qty 1

## 2018-05-11 MED ORDER — HYDROCHLOROTHIAZIDE 25 MG PO TABS
25.0000 mg | ORAL_TABLET | Freq: Every day | ORAL | Status: DC
  Administered 2018-05-11: 25 mg via ORAL
  Filled 2018-05-11: qty 1

## 2018-05-11 MED ORDER — DEXAMETHASONE SODIUM PHOSPHATE 4 MG/ML IJ SOLN
8.0000 mg | Freq: Three times a day (TID) | INTRAMUSCULAR | Status: DC
  Administered 2018-05-11 – 2018-05-12 (×2): 8 mg via INTRAVENOUS
  Filled 2018-05-11 (×3): qty 2

## 2018-05-11 MED ORDER — PROPOFOL 500 MG/50ML IV EMUL
INTRAVENOUS | Status: AC
  Filled 2018-05-11: qty 50

## 2018-05-11 MED ORDER — PROPOFOL 10 MG/ML IV BOLUS
INTRAVENOUS | Status: AC
  Filled 2018-05-11: qty 60

## 2018-05-11 MED ORDER — SODIUM CHLORIDE 0.9 % IV SOLN
INTRAVENOUS | Status: DC | PRN
  Administered 2018-05-11: .1 ug/kg/min via INTRAVENOUS

## 2018-05-11 MED ORDER — PROPOFOL 10 MG/ML IV BOLUS
INTRAVENOUS | Status: DC | PRN
  Administered 2018-05-11: 180 mg via INTRAVENOUS

## 2018-05-11 MED ORDER — LOSARTAN POTASSIUM 25 MG PO TABS
25.0000 mg | ORAL_TABLET | Freq: Every day | ORAL | Status: DC
  Administered 2018-05-11: 25 mg via ORAL
  Filled 2018-05-11: qty 1

## 2018-05-11 MED ORDER — HYDROMORPHONE HCL 1 MG/ML IJ SOLN
0.2500 mg | INTRAMUSCULAR | Status: DC | PRN
  Administered 2018-05-11 (×4): 0.25 mg via INTRAVENOUS

## 2018-05-11 MED ORDER — DEXAMETHASONE SODIUM PHOSPHATE 10 MG/ML IJ SOLN
INTRAMUSCULAR | Status: DC | PRN
  Administered 2018-05-11: 10 mg via INTRAVENOUS

## 2018-05-11 MED ORDER — METHOCARBAMOL 500 MG PO TABS
500.0000 mg | ORAL_TABLET | Freq: Four times a day (QID) | ORAL | Status: DC | PRN
  Administered 2018-05-11: 500 mg via ORAL
  Filled 2018-05-11: qty 1

## 2018-05-11 MED ORDER — ESCITALOPRAM OXALATE 10 MG PO TABS
10.0000 mg | ORAL_TABLET | Freq: Every day | ORAL | Status: DC
  Administered 2018-05-11 – 2018-05-12 (×2): 10 mg via ORAL
  Filled 2018-05-11 (×2): qty 1

## 2018-05-11 MED ORDER — ALBUTEROL SULFATE HFA 108 (90 BASE) MCG/ACT IN AERS
2.0000 | INHALATION_SPRAY | Freq: Four times a day (QID) | RESPIRATORY_TRACT | Status: DC | PRN
  Filled 2018-05-11: qty 6.7

## 2018-05-11 MED ORDER — POLYETHYLENE GLYCOL 3350 17 G PO PACK: 17.0000 g | PACK | Freq: Every day | ORAL | Status: DC

## 2018-05-11 MED ORDER — SODIUM CHLORIDE 0.9 % IV SOLN: INTRAVENOUS | Status: DC

## 2018-05-11 MED ORDER — BACITRACIN 50000 UNITS IM SOLR
INTRAMUSCULAR | Status: AC
  Filled 2018-05-11: qty 1

## 2018-05-11 MED ORDER — ONDANSETRON HCL 4 MG/2ML IJ SOLN
INTRAMUSCULAR | Status: AC
Start: 2018-05-11 — End: ?
  Filled 2018-05-11: qty 2

## 2018-05-11 MED ORDER — KETAMINE HCL 50 MG/ML IJ SOLN
INTRAMUSCULAR | Status: DC | PRN
  Administered 2018-05-11: 50 mg via INTRAMUSCULAR

## 2018-05-11 MED ORDER — ONDANSETRON HCL 4 MG PO TABS: 4.0000 mg | ORAL_TABLET | Freq: Four times a day (QID) | ORAL | Status: DC | PRN

## 2018-05-11 MED ORDER — SODIUM CHLORIDE 0.9 % IV SOLN
INTRAVENOUS | Status: DC | PRN
  Administered 2018-05-11: 40 ug/min via INTRAVENOUS

## 2018-05-11 MED ORDER — SENNOSIDES-DOCUSATE SODIUM 8.6-50 MG PO TABS: 1.0000 | ORAL_TABLET | Freq: Every evening | ORAL | Status: DC | PRN

## 2018-05-11 MED ORDER — FLUTICASONE PROPIONATE 50 MCG/ACT NA SUSP
2.0000 | Freq: Every day | NASAL | Status: DC | PRN
  Filled 2018-05-11: qty 16

## 2018-05-11 MED ORDER — ACETAMINOPHEN 160 MG/5ML PO SOLN
325.0000 mg | ORAL | Status: DC | PRN
  Filled 2018-05-11: qty 20.3

## 2018-05-11 MED ORDER — ACETAMINOPHEN 500 MG PO TABS
1000.0000 mg | ORAL_TABLET | Freq: Four times a day (QID) | ORAL | Status: DC
  Administered 2018-05-11 – 2018-05-12 (×3): 1000 mg via ORAL
  Filled 2018-05-11 (×2): qty 2

## 2018-05-11 MED ORDER — SUCCINYLCHOLINE CHLORIDE 20 MG/ML IJ SOLN
INTRAMUSCULAR | Status: DC | PRN
  Administered 2018-05-11: 90 mg via INTRAVENOUS

## 2018-05-11 MED ORDER — PHENOL 1.4 % MT LIQD
1.0000 | OROMUCOSAL | Status: DC | PRN
  Filled 2018-05-11: qty 177

## 2018-05-11 MED ORDER — SODIUM CHLORIDE 0.9 % IV SOLN: 250.0000 mL | INTRAVENOUS | Status: DC

## 2018-05-11 MED ORDER — OXYCODONE HCL 5 MG PO TABS
5.0000 mg | ORAL_TABLET | ORAL | Status: DC | PRN
  Administered 2018-05-11 – 2018-05-12 (×2): 5 mg via ORAL
  Filled 2018-05-11 (×2): qty 1

## 2018-05-11 MED ORDER — ACETAMINOPHEN 10 MG/ML IV SOLN
INTRAVENOUS | Status: DC | PRN
  Administered 2018-05-11: 1000 mg via INTRAVENOUS

## 2018-05-11 MED ORDER — BUPIVACAINE-EPINEPHRINE (PF) 0.5% -1:200000 IJ SOLN
INTRAMUSCULAR | Status: AC
  Filled 2018-05-11: qty 30

## 2018-05-11 MED ORDER — DEXAMETHASONE SODIUM PHOSPHATE 10 MG/ML IJ SOLN
INTRAMUSCULAR | Status: AC
  Filled 2018-05-11: qty 1

## 2018-05-11 MED ORDER — SODIUM CHLORIDE 0.9% FLUSH: 3.0000 mL | Freq: Two times a day (BID) | INTRAVENOUS | Status: DC

## 2018-05-11 MED ORDER — REMIFENTANIL HCL 1 MG IV SOLR
INTRAVENOUS | Status: AC
  Filled 2018-05-11: qty 2000

## 2018-05-11 MED ORDER — GELATIN ABSORBABLE 12-7 MM EX MISC
CUTANEOUS | Status: AC
  Filled 2018-05-11: qty 1

## 2018-05-11 MED ORDER — FENTANYL CITRATE (PF) 100 MCG/2ML IJ SOLN
INTRAMUSCULAR | Status: DC | PRN
  Administered 2018-05-11: 100 ug via INTRAVENOUS

## 2018-05-11 MED ORDER — PROPOFOL 500 MG/50ML IV EMUL
INTRAVENOUS | Status: DC | PRN
  Administered 2018-05-11: 120 ug/kg/min via INTRAVENOUS

## 2018-05-11 MED ORDER — BUPIVACAINE-EPINEPHRINE 0.5% -1:200000 IJ SOLN
INTRAMUSCULAR | Status: DC | PRN
  Administered 2018-05-11: 7 mL

## 2018-05-11 MED ORDER — LORATADINE 10 MG PO TABS
10.0000 mg | ORAL_TABLET | Freq: Every day | ORAL | Status: DC
  Administered 2018-05-12: 10 mg via ORAL
  Filled 2018-05-11: qty 1

## 2018-05-11 MED ORDER — HYDROCODONE-ACETAMINOPHEN 7.5-325 MG PO TABS: 1.0000 | ORAL_TABLET | Freq: Once | ORAL | Status: DC | PRN

## 2018-05-11 MED ORDER — PROPOFOL 10 MG/ML IV BOLUS
INTRAVENOUS | Status: AC
  Filled 2018-05-11: qty 20

## 2018-05-11 MED ORDER — SODIUM CHLORIDE 0.9 % IV SOLN
INTRAVENOUS | Status: DC
  Administered 2018-05-11: 18:00:00 via INTRAVENOUS

## 2018-05-11 MED ORDER — LACTATED RINGERS IV SOLN
INTRAVENOUS | Status: DC
  Administered 2018-05-11: 11:00:00 via INTRAVENOUS

## 2018-05-11 MED ORDER — SUCCINYLCHOLINE CHLORIDE 20 MG/ML IJ SOLN
INTRAMUSCULAR | Status: AC
  Filled 2018-05-11: qty 1

## 2018-05-11 MED ORDER — ACETAMINOPHEN 325 MG PO TABS: 325.0000 mg | ORAL_TABLET | ORAL | Status: DC | PRN

## 2018-05-11 MED ORDER — HYDROMORPHONE HCL 1 MG/ML IJ SOLN: 0.5000 mg | INTRAMUSCULAR | Status: DC | PRN

## 2018-05-11 MED ORDER — MENTHOL 3 MG MT LOZG
1.0000 | LOZENGE | OROMUCOSAL | Status: DC | PRN
  Filled 2018-05-11: qty 9

## 2018-05-11 MED ORDER — CEFAZOLIN SODIUM-DEXTROSE 2-4 GM/100ML-% IV SOLN
INTRAVENOUS | Status: AC
  Filled 2018-05-11: qty 100

## 2018-05-11 MED ORDER — REMIFENTANIL HCL 1 MG IV SOLR
INTRAVENOUS | Status: AC
  Filled 2018-05-11: qty 1000

## 2018-05-11 MED ORDER — PROMETHAZINE HCL 25 MG/ML IJ SOLN: 6.2500 mg | INTRAMUSCULAR | Status: DC | PRN

## 2018-05-11 MED ORDER — LACTATED RINGERS IV SOLN
INTRAVENOUS | Status: DC | PRN
  Administered 2018-05-11: 12:00:00 via INTRAVENOUS

## 2018-05-11 MED ORDER — ROCURONIUM BROMIDE 100 MG/10ML IV SOLN
INTRAVENOUS | Status: DC | PRN
  Administered 2018-05-11: 5 mg via INTRAVENOUS

## 2018-05-11 MED ORDER — SODIUM CHLORIDE 0.9% FLUSH: 3.0000 mL | INTRAVENOUS | Status: DC | PRN

## 2018-05-11 MED ORDER — ACETAMINOPHEN 10 MG/ML IV SOLN
INTRAVENOUS | Status: AC
  Filled 2018-05-11: qty 100

## 2018-05-11 MED ORDER — SODIUM CHLORIDE 0.9 % IR SOLN
Status: DC | PRN
  Administered 2018-05-11: 1000 mL

## 2018-05-11 MED ORDER — ACETAMINOPHEN 325 MG PO TABS: 650.0000 mg | ORAL_TABLET | ORAL | Status: DC | PRN

## 2018-05-11 MED ORDER — ONDANSETRON HCL 4 MG/2ML IJ SOLN
4.0000 mg | Freq: Four times a day (QID) | INTRAMUSCULAR | Status: DC | PRN
  Administered 2018-05-12: 4 mg via INTRAVENOUS
  Filled 2018-05-11: qty 2

## 2018-05-11 MED ORDER — MEPERIDINE HCL 50 MG/ML IJ SOLN: 6.2500 mg | INTRAMUSCULAR | Status: DC | PRN

## 2018-05-11 SURGICAL SUPPLY — 63 items
ALLOGRAFT LORDOTIC 8X11X14 (Bone Implant) ×3 IMPLANT
ALLOGRAFT LORDOTIC CC 7X11X14 (Bone Implant) ×6 IMPLANT
BAND RUBBER 3X1/6 TAN STRL (MISCELLANEOUS) IMPLANT
BLADE BOVIE TIP EXT 4 (BLADE) ×3 IMPLANT
BLADE SURG 15 STRL LF DISP TIS (BLADE) ×1 IMPLANT
BLADE SURG 15 STRL SS (BLADE) ×2
BULB RESERV EVAC DRAIN JP 100C (MISCELLANEOUS) IMPLANT
BUR NEURO DRILL SOFT 3.0X3.8M (BURR) ×3 IMPLANT
CANISTER SUCT 1200ML W/VALVE (MISCELLANEOUS) ×6 IMPLANT
CHLORAPREP W/TINT 26ML (MISCELLANEOUS) ×3 IMPLANT
CLOSURE WOUND 1/2 X4 (GAUZE/BANDAGES/DRESSINGS)
COUNTER NEEDLE 20/40 LG (NEEDLE) ×3 IMPLANT
COVER LIGHT HANDLE STERIS (MISCELLANEOUS) ×6 IMPLANT
CRADLE LAMINECT ARM (MISCELLANEOUS) ×3 IMPLANT
CUP MEDICINE 2OZ PLAST GRAD ST (MISCELLANEOUS) ×6 IMPLANT
DERMABOND ADVANCED (GAUZE/BANDAGES/DRESSINGS) ×2
DERMABOND ADVANCED .7 DNX12 (GAUZE/BANDAGES/DRESSINGS) ×1 IMPLANT
DRAIN CHANNEL JP 10F RND 20C F (MISCELLANEOUS) IMPLANT
DRAPE C-ARM 42X72 X-RAY (DRAPES) ×6 IMPLANT
DRAPE INCISE IOBAN 66X45 STRL (DRAPES) ×3 IMPLANT
DRAPE LAPAROTOMY 77X122 PED (DRAPES) ×3 IMPLANT
DRAPE MICROSCOPE SPINE 48X150 (DRAPES) ×3 IMPLANT
DRAPE POUCH INSTRU U-SHP 10X18 (DRAPES) ×3 IMPLANT
DRAPE SHEET LG 3/4 BI-LAMINATE (DRAPES) ×3 IMPLANT
DRAPE SURG 17X11 SM STRL (DRAPES) ×12 IMPLANT
DRAPE TABLE BACK 80X90 (DRAPES) ×3 IMPLANT
ELECT CAUTERY BLADE TIP 2.5 (TIP) ×3
ELECTRODE CAUTERY BLDE TIP 2.5 (TIP) ×1 IMPLANT
FEE INTRAOP MONITOR IMPULS NCS (MISCELLANEOUS) ×1 IMPLANT
FLOSEAL 5ML (HEMOSTASIS) ×6 IMPLANT
FRAME EYE SHIELD (PROTECTIVE WEAR) ×3 IMPLANT
GAUZE SPONGE 4X4 12PLY STRL (GAUZE/BANDAGES/DRESSINGS) ×3 IMPLANT
GLOVE SURG SYN 8.5  E (GLOVE) ×6
GLOVE SURG SYN 8.5 E (GLOVE) ×3 IMPLANT
GOWN SRG XL LVL 3 NONREINFORCE (GOWNS) ×1 IMPLANT
GOWN STRL NON-REIN TWL XL LVL3 (GOWNS) ×2
GOWN STRL REUS W/ TWL LRG LVL3 (GOWN DISPOSABLE) ×1 IMPLANT
GOWN STRL REUS W/TWL LRG LVL3 (GOWN DISPOSABLE) ×2
GRADUATE 1200CC STRL 31836 (MISCELLANEOUS) ×3 IMPLANT
INTRAOP MONITOR FEE IMPULS NCS (MISCELLANEOUS) ×1
INTRAOP MONITOR FEE IMPULSE (MISCELLANEOUS) ×2
IV CATH ANGIO 12GX3 LT BLUE (NEEDLE) ×3 IMPLANT
KIT TURNOVER KIT A (KITS) ×3 IMPLANT
MARKER SKIN DUAL TIP RULER LAB (MISCELLANEOUS) ×6 IMPLANT
NEEDLE HYPO 22GX1.5 SAFETY (NEEDLE) ×3 IMPLANT
NS IRRIG 1000ML POUR BTL (IV SOLUTION) ×3 IMPLANT
PACK LAMINECTOMY NEURO (CUSTOM PROCEDURE TRAY) ×3 IMPLANT
PIN CASPAR 14 (PIN) ×1 IMPLANT
PIN CASPAR 14MM (PIN) ×3
PLATE ARCHON 3-LEVEL 58MM (Plate) ×3 IMPLANT
SCREW ARCHON SD VAR 4.0X15MM (Screw) ×15 IMPLANT
SCREW ARCHON ST VAR 4.0X17MM (Screw) ×3 IMPLANT
SCREW ARCHON ST VAR 4.5X15MM (Screw) ×3 IMPLANT
SPONGE KITTNER 5P (MISCELLANEOUS) ×6 IMPLANT
STRIP CLOSURE SKIN 1/2X4 (GAUZE/BANDAGES/DRESSINGS) IMPLANT
SUT V-LOC 90 ABS DVC 3-0 CL (SUTURE) ×3 IMPLANT
SUT VIC AB 3-0 SH 8-18 (SUTURE) ×3 IMPLANT
SYR 30ML LL (SYRINGE) ×3 IMPLANT
TAPE CLOTH 3X10 WHT NS LF (GAUZE/BANDAGES/DRESSINGS) ×3 IMPLANT
TOWEL OR 17X26 4PK STRL BLUE (TOWEL DISPOSABLE) ×12 IMPLANT
TRAY FOLEY MTR SLVR 16FR STAT (SET/KITS/TRAYS/PACK) IMPLANT
TUBING CONNECTING 10 (TUBING) ×2 IMPLANT
TUBING CONNECTING 10' (TUBING) ×1

## 2018-05-11 NOTE — Anesthesia Preprocedure Evaluation (Addendum)
Anesthesia Evaluation  Patient identified by MRN, date of birth, ID band Patient awake    Reviewed: Allergy & Precautions, H&P , NPO status , reviewed documented beta blocker date and time   History of Anesthesia Complications (+) Family history of anesthesia reaction  Airway Mallampati: II  TM Distance: >3 FB Neck ROM: full   Comment: Good neck extension w mild tingling arm Dental  (+) Chipped, Caps   Pulmonary asthma , former smoker,    Pulmonary exam normal        Cardiovascular hypertension, Normal cardiovascular exam     Neuro/Psych  Neuromuscular disease    GI/Hepatic GERD  Controlled,  Endo/Other    Renal/GU      Musculoskeletal  (+) Arthritis ,   Abdominal   Peds  Hematology   Anesthesia Other Findings Past Medical History: No date: Arthritis No date: Asthma No date: Bilateral ovarian cysts No date: Constipation No date: Family history of adverse reaction to anesthesia     Comment:  mother PONV No date: GERD (gastroesophageal reflux disease) No date: H/O seasonal allergies No date: History of kidney stones No date: Hypertension No date: Sinus problem Past Surgical History: No date: back; Bilateral     Comment:  cervical disc 87 or 88: BACK SURGERY No date: BREAST EXCISIONAL BIOPSY     Comment:  benign 1975: BREAST LUMPECTOMY; Left No date: CATARACT EXTRACTION 10/03/2015: LAPAROSCOPIC HYSTERECTOMY; Bilateral     Comment:  Procedure: HYSTERECTOMY TOTAL LAPAROSCOPIC, BILATERAL               SALPINGOOPHERECTOMY;  Surgeon: Honor Loh Ward, MD;                Location: [facility] ORS;  Service: Gynecology;  Laterality:               Bilateral; No date: nodules vocal cords 1984: TUBAL LIGATION   Reproductive/Obstetrics                             Anesthesia Physical Anesthesia Plan  ASA: III  Anesthesia Plan: General ETT   Post-op Pain Management:    Induction:   PONV  Risk Score and Plan: 3 and Ondansetron, Treatment may vary due to age or medical condition, Midazolam and TIVA  Airway Management Planned:   Additional Equipment:   Intra-op Plan:   Post-operative Plan:   Informed Consent: I have reviewed the patients History and Physical, chart, labs and discussed the procedure including the risks, benefits and alternatives for the proposed anesthesia with the patient or authorized representative who has indicated his/her understanding and acceptance.   Dental Advisory Given  Plan Discussed with: CRNA  Anesthesia Plan Comments:         Anesthesia Quick Evaluation

## 2018-05-11 NOTE — Anesthesia Procedure Notes (Signed)
Procedure Name: Intubation Date/Time: 05/11/2018 12:25 PM Performed by: Eben Burow, CRNA Pre-anesthesia Checklist: Patient identified, Emergency Drugs available, Suction available, Patient being monitored and Timeout performed Patient Re-evaluated:Patient Re-evaluated prior to induction Oxygen Delivery Method: Circle system utilized Preoxygenation: Pre-oxygenation with 100% oxygen Induction Type: IV induction Ventilation: Mask ventilation without difficulty Laryngoscope Size: McGraph and 3 Grade View: Grade I Tube type: Oral Tube size: 7.0 mm Number of attempts: 1 Airway Equipment and Method: Stylet,  Video-laryngoscopy and LTA kit utilized Placement Confirmation: positive ETCO2,  breath sounds checked- equal and bilateral and ETT inserted through vocal cords under direct vision Secured at: 21 cm Tube secured with: Tape Dental Injury: Teeth and Oropharynx as per pre-operative assessment

## 2018-05-11 NOTE — Progress Notes (Signed)
Procedure: C4-C7 ACDF Procedure Date: 05/11/2018 Diagnosis: Cervical radiculopathy  History: Brandi Newton is seen in post op recovery s/p C4/C7 ACDF. Still mildly disoriented from anesthesia. Overall, she is doing well. Denies arm pain/numbness/tingling. Symptoms present prior to surgery seem to have resolved. Pain rated 3/10, most prominent in the posterior cervical spine. Also complains of a sore throat. No issues with swallowing.   Physical Exam: Vitals:   05/11/18 1108 05/11/18 1536  BP: (!) 146/76 (!) 155/75  Pulse: 75 85  Resp: 18 17  Temp: (!) 97.4 F (36.3 C) 97.7 F (36.5 C)  SpO2: 99% 99%    AA Ox3 CNI Skin: Incision intact - glue present Strength:5/5 throughout Sensation intact and symmetric  Data:  No results for input(s): NA, K, CL, CO2, BUN, CREATININE, LABGLOM, GLUCOSE, CALCIUM in the last 168 hours. No results for input(s): AST, ALT, ALKPHOS in the last 168 hours.  Invalid input(s): TBILI   No results for input(s): WBC, HGB, HCT, PLT in the last 168 hours. No results for input(s): APTT, INR in the last 168 hours.       Assessment/Plan:  Brandi Newton is s/p C4-C7 ACDF and is doing well. Denies arm pain that was present prior to surgery. Denies any new pain/numbness/tingling. Continue pain control with tylenol, robaxin, and oxycodone as needed. Brace requested.   Marin Olp PA-C Department of Neurosurgery

## 2018-05-11 NOTE — Anesthesia Postprocedure Evaluation (Signed)
Anesthesia Post Note  Patient: Brandi Newton  Procedure(s) Performed: ANTERIOR CERVICAL DECOMPRESSION/DISCECTOMY FUSION 3 LEVELS-C4-7 (N/A )  Patient location during evaluation: PACU Anesthesia Type: General Level of consciousness: awake and alert and oriented Pain management: pain level controlled Vital Signs Assessment: post-procedure vital signs reviewed and stable Respiratory status: spontaneous breathing Cardiovascular status: blood pressure returned to baseline Anesthetic complications: no     Last Vitals:  Vitals:   05/11/18 1641 05/11/18 1728  BP: (!) 145/79 (!) 144/77  Pulse: (!) 7 76  Resp:  18  Temp: 36.6 C 36.6 C  SpO2: 96% 95%    Last Pain:  Vitals:   05/11/18 1728  TempSrc: Oral  PainSc:                  [name]

## 2018-05-11 NOTE — Transfer of Care (Signed)
Immediate Anesthesia Transfer of Care Note  Patient: Brandi Newton  Procedure(s) Performed: ANTERIOR CERVICAL DECOMPRESSION/DISCECTOMY FUSION 3 LEVELS-C4-7 (N/A )  Patient Location: PACU  Anesthesia Type:General  Level of Consciousness: awake, alert  and oriented  Airway & Oxygen Therapy: Patient connected to face mask oxygen  Post-op Assessment: Post -op Vital signs reviewed and stable  Post vital signs: stable  Last Vitals:  Vitals Value Taken Time  BP 155/75 05/11/2018  3:36 PM  Temp    Pulse 85 05/11/2018  3:36 PM  Resp 17 05/11/2018  3:36 PM  SpO2 99 % 05/11/2018  3:36 PM    Last Pain:  Vitals:   05/11/18 1108  TempSrc: Tympanic  PainSc: 0-No pain         Complications: No apparent anesthesia complications

## 2018-05-11 NOTE — Op Note (Signed)
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  The patient failed conservative management and elected for surgical intervention.  Findings: extensive spondylosis  Preoperative Diagnosis: Cervical myelopathy Postoperative Diagnosis: same   EBL: 75 ml IVF: 600 ml Drains: none Disposition: Extubated and Stable to PACU Complications: none  No foley catheter was placed.   Preoperative Note:   Risks of surgery discussed include: infection, bleeding, stroke, coma, death, paralysis, CSF leak, nerve/spinal cord injury, numbness, tingling, weakness, complex regional pain syndrome, recurrent stenosis and/or disc herniation, vascular injury, development of instability, neck/back pain, need for further surgery, persistent symptoms, development of deformity, and the risks of anesthesia. The patient understood these risks and agreed to proceed.     Operative Procedure: 1. Anterior Cervical Discectomy and Fusion C4-5 including bilateral foraminotomies and end plate preparation  2. Anterior Cervical Discectomy and Fusion C5-6 including bilateral foraminotomies and end plate preparation  3. Anterior Cervical Discectomy and Fusion C6-7 including bilateral foraminotomies and end plate preparation 4. Anterior Spinal Instrumentation C4 to 7 5. Anterior arthrodesis from C4 to C7 6. Use of the operative microscope 7. Use of intraoperative flouroscopy  PROCEDURE IN DETAIL: After obtaining informed consent, the patient taken to the operating room, placed in supine position, general anesthesia induced.  The patient had a small shoulder roll placed behind their shoulders.  The patient received preop antibiotics and IV Decadron.  The patient had a neck incision outlined, was prepped and draped in usual sterile fashion. The incision was injected with local anesthetic.   An incision was opened, dissection taken down medial to the carotid  artery and jugular vein, lateral to the trachea and esophagus.  The prevertebral fascia identified and a localizing x-ray demonstrated the correct level.  The longus colli were dissected laterally, and self-retaining retractors placed to open the operative field. The microscope was then brought into the field.  With this complete, distractor pins were placed in the vertebral bodies of C4 and C5.  The distractor was placed from C4 to C5. The annuli at C4-5 was opened with a bovie. Curettes and pituitary rongeurs used to remove the majority of disk at each level, then the drill was used to remove the posterior osteophyte and begin the foraminotomies. The nerve hook was used to elevate the posterior longitudinal ligament, which was then removed with Kerrison rongeurs. The microblunt nerve hook could be passed out the foramen bilaterally.   Meticulous hemostasis obtained. After hemostasis, structural allograft was tapped behind the anterior lip of the vertebral body at C4-5 (8 mm height).    The distractor was then removed, and the C4 caspar pin removed. Bone wax was used for hemostasis. An additional Caspar pin was placed at C7. The distractor was placed from C5-7, and the annulus at C5-7 was opened using a bovie.  Curettes and pituitary rongeurs used to remove the majority of disk, then the drill was used to remove the posterior osteophyte and begin the foraminotomies. The nerve hook was used to elevate the posterior longitudinal ligament, which was then removed with Kerrison rongeurs. The microblunt nerve hook could be passed out the foramen bilaterally at each level.   Meticulous hemostasis was obtained.  Structural allograft coated with demineralized bone matrix was tapped behind the anterior lip of the vertebral body at C5-6 (7 mm) and C6-7 (7 mm height).  The caspar distractor was removed, and bone wax used for hemostasis at each level. The anterior osteophytes were  removed.    A separate, four segment,  three level plate (58 mm Nuvasive Archon) was chosen.  Two screws placed in the vertebral bodies of all four segments, respectively making sure the screws were behind the locking mechanism.  Final AP and lateral radiographs were taken.   With everything in good position, the wound was irrigated copiously with bacitracin-containing solution and meticulous hemostasis obtained.  Wound was closed in 2 layers using interrupted inverted 3-0 Vicryl sutures in the platysma and 3-0 monocryl in the dermis.  The wound was dressed with dermabond, the head of bed at 30 degrees, taken to recovery room in stable condition.  No new postop neurological deficits were identified..  All counts were correct at the end of the case. Monitoring was used throughout without any changes.   I performed the entire procedure with Marin Olp PA as an Environmental consultant.  Meade Maw MD

## 2018-05-11 NOTE — Anesthesia Post-op Follow-up Note (Signed)
Anesthesia QCDR form completed.        

## 2018-05-11 NOTE — H&P (Signed)
I have reviewed and confirmed my history and physical from 04/21/2018 with no additions or changes. Plan for ACDf C4-7.  Risks and benefits reviewed.  Heart sounds normal no MRG. Chest Clear to Auscultation Bilaterally.

## 2018-05-12 ENCOUNTER — Encounter: Payer: Self-pay | Admitting: Neurosurgery

## 2018-05-12 DIAGNOSIS — M4712 Other spondylosis with myelopathy, cervical region: Secondary | ICD-10-CM | POA: Diagnosis not present

## 2018-05-12 MED ORDER — TRAMADOL HCL 50 MG PO TABS: 50.0000 mg | ORAL_TABLET | Freq: Four times a day (QID) | ORAL | 0 refills | Status: DC | PRN

## 2018-05-12 MED ORDER — METHOCARBAMOL 500 MG PO TABS: 500.0000 mg | ORAL_TABLET | Freq: Four times a day (QID) | ORAL | 0 refills | Status: DC | PRN

## 2018-05-12 NOTE — Discharge Instructions (Signed)

## 2018-05-12 NOTE — Evaluation (Signed)
Physical Therapy Evaluation Patient Details Name: Brandi Newton MRN: [id number] DOB: 1946/08/09 Today's Date: 05/12/2018   History of Present Illness  72 y/o female s/p C4/7 ACDF 05/11/18.  Pt had been having b/l UE radiating pain/numbness, much improved since sx at time of PT exam.  Clinical Impression  Pt eager to work with PT and go for prolonged walk.  She had some initial minimal unsteadiness but no LOBs and generally did well with consistent cadence during ambulation initially with walker and then >100 ft w/o AD and no overt safety issues.  Pt was able to manage step safely and ultimately showed ability to return home safely.  Further PT intervention per surgeon recs.    Follow Up Recommendations Follow surgeon's recommendation for DC plan and follow-up therapies    Equipment Recommendations  None recommended by PT    Recommendations for Other Services       Precautions / Restrictions Precautions Precautions: Fall Required Braces or Orthoses: (waiting on collar brace) Restrictions Weight Bearing Restrictions: No      Mobility  Bed Mobility Overal bed mobility: Modified Independent                Transfers Overall transfer level: Modified independent Equipment used: 1 person hand held assist             General transfer comment: Pt able to rise to standing with good safety, no overt LOBs  Ambulation/Gait Ambulation/Gait assistance: Supervision Ambulation Distance (Feet): 250 Feet Assistive device: Rolling walker (2 wheeled);None       General Gait Details: Pt with some initial minimal unsteadiness and did well with the walker, after she had gone ~75 ft she was able to let go of the walker and safety ambulate w/o UEs.  She had some baseline gait issues/unsteadiness that she (and daughter) reports as much better now since sx.  Stairs Stairs: Yes Stairs assistance: Supervision Stair Management: No rails;Forwards;With walker Number of Stairs: 1 General  stair comments: Pt able to negotiate up/down step with good confidence and relative ease  Wheelchair Mobility    Modified Rankin (Stroke Patients Only)       Balance Overall balance assessment: Modified Independent                                           Pertinent Vitals/Pain Pain Assessment: 0-10 Pain Score: 3  Pain Location: neck    Home Living Family/patient expects to be discharged to:: Private residence Living Arrangements: Spouse/significant other Available Help at Discharge: Family(daughter in town, able to stay a day or 2 if needed) Type of Home: House Home Access: Stairs to enter Entrance Stairs-Rails: None Entrance Stairs-Number of Steps: 1 Home Layout: Able to live on main level with bedroom/bathroom Home Equipment: Walker - 2 wheels;Cane - single point      Prior Function Level of Independence: Independent               Hand Dominance        Extremity/Trunk Assessment   Upper Extremity Assessment Upper Extremity Assessment: Overall WFL for tasks assessed(good strength bilaterally, )    Lower Extremity Assessment Lower Extremity Assessment: Overall WFL for tasks assessed       Communication   Communication: No difficulties  Cognition Arousal/Alertness: Awake/alert Behavior During Therapy: WFL for tasks assessed/performed Overall Cognitive Status: Within Functional Limits for tasks assessed  General Comments      Exercises     Assessment/Plan    PT Assessment Patient needs continued PT services  PT Problem List Decreased strength;Decreased activity tolerance;Decreased balance;Decreased mobility;Decreased coordination;Decreased safety awareness;Decreased knowledge of use of DME       PT Treatment Interventions DME instruction;Gait training;Stair training;Therapeutic activities;Functional mobility training;Therapeutic exercise;Balance training;Neuromuscular  re-education;Patient/family education    PT Goals (Current goals can be found in the Care Plan section)  Acute Rehab PT Goals Patient Stated Goal: go home PT Goal Formulation: With patient Time For Goal Achievement: 05/26/18 Potential to Achieve Goals: Good    Frequency 7X/week   Barriers to discharge        Co-evaluation               AM-PAC PT "6 Clicks" Daily Activity  Outcome Measure Difficulty turning over in bed (including adjusting bedclothes, sheets and blankets)?: None Difficulty moving from lying on back to sitting on the side of the bed? : None Difficulty sitting down on and standing up from a chair with arms (e.g., wheelchair, bedside commode, etc,.)?: None Help needed moving to and from a bed to chair (including a wheelchair)?: None Help needed walking in hospital room?: None Help needed climbing 3-5 steps with a railing? : None 6 Click Score: 24    End of Session Equipment Utilized During Treatment: Gait belt Activity Tolerance: Patient tolerated treatment well Patient left: in chair;with call bell/phone within reach;with family/visitor present Nurse Communication: Mobility status PT Visit Diagnosis: Unsteadiness on feet (R26.81);Muscle weakness (generalized) (M62.81);Other symptoms and signs involving the nervous system (A76.811)    Time: 5726-2035 PT Time Calculation (min) (ACUTE ONLY): 22 min   Charges:   PT Evaluation $PT Eval Low Complexity: 1 Low PT Treatments $Gait Training: 8-22 mins        Kreg Shropshire, DPT 05/12/2018, 10:49 AM

## 2018-05-12 NOTE — Progress Notes (Signed)
Discharge summary reviewed with verbal understanding. Aspen cervical collar placed prior to discharge. 1 Rx given upon discharge.

## 2018-05-12 NOTE — Discharge Summary (Addendum)
  History: Brandi Newton is POD1 s/p C4-C7 ACDF. She is doing well overall. Arm pain she was experiencing prior to surgery has resolved. Continue to have scattered numbness but she feels this is improving. Primary post-op complaint is right scapular pain/soreness. Denies issues with swallowing. Has ambulated to the restroom and voided without issue.    Physical Exam: Vitals:   05/12/18 0430 05/12/18 0823  BP: (!) 144/72 125/75  Pulse: 79 77  Resp: 18 20  Temp: 97.6 F (36.4 C) 97.7 F (36.5 C)  SpO2: 90% 93%    AA Ox3 CNI Skin: incision site intact - glue intact Strength:5/5 throughout Sensation: intact and symmetric   Data:  No results for input(s): NA, K, CL, CO2, BUN, CREATININE, LABGLOM, GLUCOSE, CALCIUM in the last 168 hours. No results for input(s): AST, ALT, ALKPHOS in the last 168 hours.  Invalid input(s): TBILI   No results for input(s): WBC, HGB, HCT, PLT in the last 168 hours. No results for input(s): APTT, INR in the last 168 hours.       Other tests/results: EXAM: CERVICAL SPINE - 2-3 VIEW  COMPARISON:  MR cervical spine dated January 21, 2018.  FINDINGS: The lateral view is diagnostic to the C7 level. Interval C4-C7 ACDF with interbody grafts. There is no acute fracture or subluxation. Vertebral body heights are preserved. Alignment is normal. Mild disc height loss at C3-C4. Facet arthropathy at C3-C4 and C7-T1. Postsurgical prevertebral soft tissue swelling in the mid to lower cervical spine.  IMPRESSION: C4-C7 ACDF without evidence of acute postoperative complication.  Assessment/Plan:  Brandi Newton is POD1 s/p C4-C7 ACDF and recovering well. Pain has been adequately controlled with oxycodone, robaxin, and tylenol but she feels the narcotic medication is causing some GI upset. Due to minimal pain complaints, patient was agreeable to continue pain control with tramadol, robaxin, and tylenol. Advised that if pain becomes unmanageable, to  contact office for medication change. Patient has only ambulated to bathroom, due to balance issues, she is getting evaluated by PT and also has PT set up at home post-operatively. Awaiting aspen brace. Follow up in 2 weeks already scheduled in clinic  Marin Olp PA-C Department of Neurosurgery

## 2018-06-27 ENCOUNTER — Other Ambulatory Visit: Payer: Self-pay | Admitting: Family Medicine

## 2018-06-27 DIAGNOSIS — Z1231 Encounter for screening mammogram for malignant neoplasm of breast: Secondary | ICD-10-CM

## 2018-07-12 ENCOUNTER — Ambulatory Visit
Admission: RE | Admit: 2018-07-12 | Discharge: 2018-07-12 | Disposition: A | Discharge status: Home / Self Care | Payer: Medicare Other | Source: Ambulatory Visit | Attending: Family Medicine | Admitting: Family Medicine

## 2018-07-12 DIAGNOSIS — Z1231 Encounter for screening mammogram for malignant neoplasm of breast: Secondary | ICD-10-CM | POA: Diagnosis present

## 2018-09-29 ENCOUNTER — Other Ambulatory Visit: Payer: Self-pay | Admitting: Neurosurgery

## 2018-09-29 DIAGNOSIS — G959 Disease of spinal cord, unspecified: Secondary | ICD-10-CM

## 2018-09-29 DIAGNOSIS — Z981 Arthrodesis status: Secondary | ICD-10-CM

## 2018-10-19 ENCOUNTER — Ambulatory Visit
Admission: RE | Admit: 2018-10-19 | Discharge: 2018-10-19 | Disposition: A | Discharge status: Home / Self Care | Payer: Medicare Other | Source: Ambulatory Visit | Attending: Neurosurgery | Admitting: Neurosurgery

## 2018-10-19 DIAGNOSIS — G959 Disease of spinal cord, unspecified: Secondary | ICD-10-CM | POA: Diagnosis not present

## 2018-10-19 DIAGNOSIS — M4802 Spinal stenosis, cervical region: Secondary | ICD-10-CM | POA: Diagnosis not present

## 2018-10-19 DIAGNOSIS — M5031 Other cervical disc degeneration,  high cervical region: Secondary | ICD-10-CM | POA: Diagnosis not present

## 2018-10-19 DIAGNOSIS — Z981 Arthrodesis status: Secondary | ICD-10-CM

## 2018-10-19 DIAGNOSIS — M2578 Osteophyte, vertebrae: Secondary | ICD-10-CM | POA: Diagnosis not present

## 2019-04-24 ENCOUNTER — Encounter
Admission: RE | Admit: 2019-04-24 | Discharge: 2019-04-24 | Disposition: A | Discharge status: Home / Self Care | Payer: Medicare Other | Source: Ambulatory Visit | Attending: Surgery | Admitting: Surgery

## 2019-04-24 ENCOUNTER — Other Ambulatory Visit: Payer: Self-pay

## 2019-04-24 DIAGNOSIS — M170 Bilateral primary osteoarthritis of knee: Secondary | ICD-10-CM | POA: Insufficient documentation

## 2019-04-24 DIAGNOSIS — Z1159 Encounter for screening for other viral diseases: Secondary | ICD-10-CM | POA: Insufficient documentation

## 2019-04-24 DIAGNOSIS — Z01812 Encounter for preprocedural laboratory examination: Secondary | ICD-10-CM | POA: Insufficient documentation

## 2019-04-24 HISTORY — DX: Other specified postprocedural states: Z98.890

## 2019-04-24 HISTORY — DX: Nausea with vomiting, unspecified: R11.2

## 2019-04-24 LAB — CBC
HCT: 42.8 % (ref 36.0–46.0)
Hemoglobin: 13.5 g/dL (ref 12.0–15.0)
MCH: 29.5 pg (ref 26.0–34.0)
MCHC: 31.5 g/dL (ref 30.0–36.0)
MCV: 93.4 fL (ref 80.0–100.0)
Platelets: 295 10*3/uL (ref 150–400)
RBC: 4.58 MIL/uL (ref 3.87–5.11)
RDW: 14.5 % (ref 11.5–15.5)
WBC: 9.2 10*3/uL (ref 4.0–10.5)
nRBC: 0 % (ref 0.0–0.2)

## 2019-04-24 LAB — BASIC METABOLIC PANEL
Anion gap: 7 (ref 5–15)
BUN: 27 mg/dL — ABNORMAL HIGH (ref 8–23)
CO2: 26 mmol/L (ref 22–32)
Calcium: 10.5 mg/dL — ABNORMAL HIGH (ref 8.9–10.3)
Chloride: 105 mmol/L (ref 98–111)
Creatinine, Ser: 1.31 mg/dL — ABNORMAL HIGH (ref 0.44–1.00)
GFR calc Af Amer: 47 mL/min — ABNORMAL LOW (ref >60)
GFR calc non Af Amer: 41 mL/min — ABNORMAL LOW (ref >60)
Glucose, Bld: 118 mg/dL — ABNORMAL HIGH (ref 70–99)
Potassium: 4.6 mmol/L (ref 3.5–5.1)
Sodium: 138 mmol/L (ref 135–145)

## 2019-04-24 LAB — URINALYSIS, ROUTINE W REFLEX MICROSCOPIC
Bilirubin Urine: NEGATIVE
Glucose, UA: NEGATIVE mg/dL
Hgb urine dipstick: NEGATIVE
Ketones, ur: NEGATIVE mg/dL
Leukocytes,Ua: NEGATIVE
Nitrite: NEGATIVE
Protein, ur: NEGATIVE mg/dL
Specific Gravity, Urine: 1.013 (ref 1.005–1.030)
pH: 5 (ref 5.0–8.0)

## 2019-04-24 LAB — TYPE AND SCREEN
ABO/RH(D): A POS
Antibody Screen: NEGATIVE

## 2019-04-24 LAB — SURGICAL PCR SCREEN
MRSA, PCR: NEGATIVE
Staphylococcus aureus: NEGATIVE

## 2019-04-24 NOTE — Patient Instructions (Signed)
Your procedure is scheduled on: Thursday 04/27/19 Report to [place]. To find out your arrival time please call [phone] between 1PM - 3PM on Wednesday 04/26/19.  Remember: Instructions that are not followed completely may result in serious medical risk, up to and including death, or upon the discretion of your surgeon and anesthesiologist your surgery may need to be rescheduled.     _X__ 1. Do not eat food after midnight the night before your procedure.                 No gum chewing or hard candies. You may drink clear liquids up to 2 hours                 before you are scheduled to arrive for your surgery- DO not drink clear                 liquids within 2 hours of the start of your surgery.                 Clear Liquids include:  water, apple juice without pulp, clear carbohydrate                 drink such as Clearfast or Gatorade, Black Coffee or Tea (Do not add                 anything to coffee or tea).  __X__2.  On the morning of surgery brush your teeth with toothpaste and water, you                 may rinse your mouth with mouthwash if you wish.  Do not swallow any              toothpaste of mouthwash.     _X__ 3.  No Alcohol for 24 hours before or after surgery.   _X__ 4.  Do Not Smoke or use e-cigarettes For 24 Hours Prior to Your Surgery.                 Do not use any chewable tobacco products for at least 6 hours prior to                 surgery.  ____  5.  Bring all medications with you on the day of surgery if instructed.   __X__  6.  Notify your doctor if there is any change in your medical condition      (cold, fever, infections).     Do not wear jewelry, make-up, hairpins, clips or nail polish. Do not wear lotions, powders, or perfumes.  Do not shave 48 hours prior to surgery. Men may shave face and neck. Do not bring valuables to the hospital.    [facility] is not responsible for any belongings or  valuables.  Contacts, dentures/partials or body piercings may not be worn into surgery. Bring a case for your contacts, glasses or hearing aids, a denture cup will be supplied. Leave your suitcase in the car. After surgery it may be brought to your room. For patients admitted to the hospital, discharge time is determined by your treatment team.   Patients discharged the day of surgery will not be allowed to drive home.   Please read over the following fact sheets that you were given:   MRSA Information  __X__ Take these medicines the morning of surgery with A SIP OF WATER:  1. escitalopram (LEXAPRO)  2. omeprazole (PRILOSEC)  3.   4.  5.  6.  ____ Fleet Enema (as directed)   __X__ Use CHG Soap/SAGE wipes as directed  __X__ Use inhalers on the day of surgery  ____ Stop metformin/Janumet/Farxiga 2 days prior to surgery    ____ Take 1/2 of usual insulin dose the night before surgery. No insulin the morning          of surgery.   ____ Stop Blood Thinners Coumadin/Plavix/Xarelto/Pleta/Pradaxa/Eliquis/Effient/Aspirin  on   Or contact your Surgeon, Cardiologist or Medical Doctor regarding  ability to stop your blood thinners  __X__ Stop Anti-inflammatories 7 days before surgery such as Advil, Ibuprofen, Motrin,  BC or Goodies Powder, Naprosyn, Naproxen, Aleve, Aspirin    __X__ Stop all herbal supplements, fish oil or vitamin E until after surgery.    ____ Bring C-Pap to the hospital.

## 2019-04-25 LAB — NOVEL CORONAVIRUS, NAA (HOSP ORDER, SEND-OUT TO REF LAB; TAT 18-24 HRS): SARS-CoV-2, NAA: NOT DETECTED

## 2019-04-25 LAB — URINE CULTURE: Culture: NO GROWTH

## 2019-04-26 MED ORDER — CEFAZOLIN SODIUM-DEXTROSE 2-4 GM/100ML-% IV SOLN
2.0000 g | Freq: Once | INTRAVENOUS | Status: AC
  Administered 2019-04-27: 2 g via INTRAVENOUS

## 2019-04-27 ENCOUNTER — Inpatient Hospital Stay
Admission: RE | Admit: 2019-04-27 | Discharge: 2019-04-29 | DRG: 470 | Disposition: A | Discharge status: Home Health | Payer: Medicare Other | Attending: Surgery | Admitting: Surgery

## 2019-04-27 ENCOUNTER — Inpatient Hospital Stay: Payer: Medicare Other | Admitting: Certified Registered"

## 2019-04-27 ENCOUNTER — Other Ambulatory Visit: Payer: Self-pay

## 2019-04-27 ENCOUNTER — Inpatient Hospital Stay: Payer: Medicare Other

## 2019-04-27 ENCOUNTER — Encounter
Admission: RE | Disposition: A | Discharge status: Home Health | Payer: Self-pay | Source: Home / Self Care | Attending: Surgery

## 2019-04-27 DIAGNOSIS — Z87891 Personal history of nicotine dependence: Secondary | ICD-10-CM | POA: Diagnosis not present

## 2019-04-27 DIAGNOSIS — Z90722 Acquired absence of ovaries, bilateral: Secondary | ICD-10-CM | POA: Diagnosis not present

## 2019-04-27 DIAGNOSIS — Z888 Allergy status to other drugs, medicaments and biological substances status: Secondary | ICD-10-CM | POA: Diagnosis not present

## 2019-04-27 DIAGNOSIS — Z87442 Personal history of urinary calculi: Secondary | ICD-10-CM | POA: Diagnosis not present

## 2019-04-27 DIAGNOSIS — Z981 Arthrodesis status: Secondary | ICD-10-CM | POA: Diagnosis not present

## 2019-04-27 DIAGNOSIS — Z79899 Other long term (current) drug therapy: Secondary | ICD-10-CM

## 2019-04-27 DIAGNOSIS — M1711 Unilateral primary osteoarthritis, right knee: Principal | ICD-10-CM | POA: Diagnosis present

## 2019-04-27 DIAGNOSIS — I10 Essential (primary) hypertension: Secondary | ICD-10-CM | POA: Diagnosis present

## 2019-04-27 DIAGNOSIS — Z8249 Family history of ischemic heart disease and other diseases of the circulatory system: Secondary | ICD-10-CM | POA: Diagnosis not present

## 2019-04-27 DIAGNOSIS — Z96651 Presence of right artificial knee joint: Secondary | ICD-10-CM

## 2019-04-27 DIAGNOSIS — Z885 Allergy status to narcotic agent status: Secondary | ICD-10-CM

## 2019-04-27 DIAGNOSIS — Z90710 Acquired absence of both cervix and uterus: Secondary | ICD-10-CM | POA: Diagnosis not present

## 2019-04-27 DIAGNOSIS — J45909 Unspecified asthma, uncomplicated: Secondary | ICD-10-CM | POA: Diagnosis present

## 2019-04-27 DIAGNOSIS — K219 Gastro-esophageal reflux disease without esophagitis: Secondary | ICD-10-CM | POA: Diagnosis present

## 2019-04-27 DIAGNOSIS — Z791 Long term (current) use of non-steroidal anti-inflammatories (NSAID): Secondary | ICD-10-CM | POA: Diagnosis not present

## 2019-04-27 DIAGNOSIS — Z1159 Encounter for screening for other viral diseases: Secondary | ICD-10-CM

## 2019-04-27 HISTORY — PX: TOTAL KNEE ARTHROPLASTY: SHX125

## 2019-04-27 SURGERY — ARTHROPLASTY, KNEE, TOTAL
Anesthesia: General | Laterality: Right

## 2019-04-27 MED ORDER — ROCURONIUM BROMIDE 100 MG/10ML IV SOLN
INTRAVENOUS | Status: DC | PRN
  Administered 2019-04-27: 30 mg via INTRAVENOUS
  Administered 2019-04-27: 20 mg via INTRAVENOUS
  Administered 2019-04-27: 10 mg via INTRAVENOUS

## 2019-04-27 MED ORDER — PHENYLEPHRINE HCL (PRESSORS) 10 MG/ML IV SOLN
INTRAVENOUS | Status: AC
  Filled 2019-04-27: qty 1

## 2019-04-27 MED ORDER — ENOXAPARIN SODIUM 40 MG/0.4ML ~~LOC~~ SOLN
40.0000 mg | SUBCUTANEOUS | Status: DC
  Administered 2019-04-28 – 2019-04-29 (×2): 40 mg via SUBCUTANEOUS
  Filled 2019-04-27 (×2): qty 0.4

## 2019-04-27 MED ORDER — ONDANSETRON HCL 4 MG/2ML IJ SOLN: 4.0000 mg | Freq: Four times a day (QID) | INTRAMUSCULAR | Status: DC | PRN

## 2019-04-27 MED ORDER — LACTATED RINGERS IV SOLN
INTRAVENOUS | Status: DC
  Administered 2019-04-27: 07:00:00 via INTRAVENOUS

## 2019-04-27 MED ORDER — DOCUSATE SODIUM 100 MG PO CAPS
100.0000 mg | ORAL_CAPSULE | Freq: Two times a day (BID) | ORAL | Status: DC
  Administered 2019-04-27 – 2019-04-29 (×4): 100 mg via ORAL
  Filled 2019-04-27 (×4): qty 1

## 2019-04-27 MED ORDER — FENTANYL CITRATE (PF) 100 MCG/2ML IJ SOLN
INTRAMUSCULAR | Status: AC
  Administered 2019-04-27: 10:00:00 25 ug via INTRAVENOUS
  Filled 2019-04-27: qty 2

## 2019-04-27 MED ORDER — ALBUTEROL SULFATE HFA 108 (90 BASE) MCG/ACT IN AERS: 2.0000 | INHALATION_SPRAY | RESPIRATORY_TRACT | Status: DC | PRN

## 2019-04-27 MED ORDER — TRANEXAMIC ACID 1000 MG/10ML IV SOLN
INTRAVENOUS | Status: DC | PRN
  Administered 2019-04-27: 1000 mg via INTRAVENOUS

## 2019-04-27 MED ORDER — HYDROCODONE-ACETAMINOPHEN 7.5-325 MG PO TABS: 1.0000 | ORAL_TABLET | ORAL | Status: DC | PRN

## 2019-04-27 MED ORDER — SUGAMMADEX SODIUM 200 MG/2ML IV SOLN
INTRAVENOUS | Status: AC
  Filled 2019-04-27: qty 2

## 2019-04-27 MED ORDER — ACETAMINOPHEN 10 MG/ML IV SOLN
INTRAVENOUS | Status: DC | PRN
  Administered 2019-04-27: 1000 mg via INTRAVENOUS

## 2019-04-27 MED ORDER — DIPHENHYDRAMINE HCL 12.5 MG/5ML PO ELIX
12.5000 mg | ORAL_SOLUTION | ORAL | Status: DC | PRN
  Filled 2019-04-27: qty 10

## 2019-04-27 MED ORDER — LOSARTAN POTASSIUM 25 MG PO TABS
25.0000 mg | ORAL_TABLET | Freq: Every day | ORAL | Status: DC
  Administered 2019-04-28 – 2019-04-29 (×2): 25 mg via ORAL
  Filled 2019-04-27 (×2): qty 1

## 2019-04-27 MED ORDER — POLYETHYLENE GLYCOL 3350 17 G PO PACK
17.0000 g | PACK | Freq: Every day | ORAL | Status: DC
  Administered 2019-04-28: 17 g via ORAL
  Filled 2019-04-27: qty 1

## 2019-04-27 MED ORDER — FENTANYL CITRATE (PF) 100 MCG/2ML IJ SOLN
INTRAMUSCULAR | Status: AC
  Filled 2019-04-27: qty 2

## 2019-04-27 MED ORDER — VITAMIN B-12 1000 MCG PO TABS
1000.0000 ug | ORAL_TABLET | Freq: Every day | ORAL | Status: DC
  Administered 2019-04-28 – 2019-04-29 (×2): 1000 ug via ORAL
  Filled 2019-04-27 (×2): qty 1

## 2019-04-27 MED ORDER — HYDROCHLOROTHIAZIDE 25 MG PO TABS
25.0000 mg | ORAL_TABLET | Freq: Every day | ORAL | Status: DC
  Administered 2019-04-28 – 2019-04-29 (×2): 25 mg via ORAL
  Filled 2019-04-27 (×2): qty 1

## 2019-04-27 MED ORDER — KETOROLAC TROMETHAMINE 15 MG/ML IJ SOLN
INTRAMUSCULAR | Status: AC
  Administered 2019-04-27: 10:00:00 15 mg via INTRAVENOUS
  Filled 2019-04-27: qty 1

## 2019-04-27 MED ORDER — SODIUM CHLORIDE 0.9 % IV SOLN
INTRAVENOUS | Status: DC
  Administered 2019-04-27 – 2019-04-28 (×2): via INTRAVENOUS

## 2019-04-27 MED ORDER — METOCLOPRAMIDE HCL 5 MG PO TABS: 5.0000 mg | ORAL_TABLET | Freq: Three times a day (TID) | ORAL | Status: DC | PRN

## 2019-04-27 MED ORDER — NEOMYCIN-POLYMYXIN B GU 40-200000 IR SOLN
Status: DC | PRN
  Administered 2019-04-27: 16 mL

## 2019-04-27 MED ORDER — ACETAMINOPHEN 10 MG/ML IV SOLN
INTRAVENOUS | Status: AC
  Filled 2019-04-27: qty 100

## 2019-04-27 MED ORDER — CEFAZOLIN SODIUM-DEXTROSE 2-4 GM/100ML-% IV SOLN
INTRAVENOUS | Status: AC
  Filled 2019-04-27: qty 100

## 2019-04-27 MED ORDER — PROPOFOL 10 MG/ML IV BOLUS
INTRAVENOUS | Status: AC
  Filled 2019-04-27: qty 40

## 2019-04-27 MED ORDER — SUGAMMADEX SODIUM 200 MG/2ML IV SOLN
INTRAVENOUS | Status: DC | PRN
  Administered 2019-04-27: 200 mg via INTRAVENOUS

## 2019-04-27 MED ORDER — LORATADINE 10 MG PO TABS
10.0000 mg | ORAL_TABLET | Freq: Every day | ORAL | Status: DC
  Administered 2019-04-28 – 2019-04-29 (×2): 10 mg via ORAL
  Filled 2019-04-27 (×2): qty 1

## 2019-04-27 MED ORDER — ADULT MULTIVITAMIN W/MINERALS CH
1.0000 | ORAL_TABLET | Freq: Every day | ORAL | Status: DC
  Administered 2019-04-28 – 2019-04-29 (×2): 1 via ORAL
  Filled 2019-04-27 (×2): qty 1

## 2019-04-27 MED ORDER — ACETAMINOPHEN 325 MG PO TABS
325.0000 mg | ORAL_TABLET | Freq: Four times a day (QID) | ORAL | Status: DC | PRN
  Administered 2019-04-28 (×2): 650 mg via ORAL
  Filled 2019-04-27 (×2): qty 2

## 2019-04-27 MED ORDER — BISACODYL 10 MG RE SUPP: 10.0000 mg | Freq: Every day | RECTAL | Status: DC | PRN

## 2019-04-27 MED ORDER — METOCLOPRAMIDE HCL 5 MG/ML IJ SOLN: 5.0000 mg | Freq: Three times a day (TID) | INTRAMUSCULAR | Status: DC | PRN

## 2019-04-27 MED ORDER — KETOROLAC TROMETHAMINE 15 MG/ML IJ SOLN
7.5000 mg | Freq: Four times a day (QID) | INTRAMUSCULAR | Status: AC
  Administered 2019-04-27 – 2019-04-28 (×3): 7.5 mg via INTRAVENOUS
  Filled 2019-04-27 (×3): qty 1

## 2019-04-27 MED ORDER — ONDANSETRON HCL 4 MG/2ML IJ SOLN
INTRAMUSCULAR | Status: AC
  Filled 2019-04-27: qty 2

## 2019-04-27 MED ORDER — LIDOCAINE HCL (PF) 2 % IJ SOLN
INTRAMUSCULAR | Status: AC
  Filled 2019-04-27: qty 10

## 2019-04-27 MED ORDER — PROPOFOL 500 MG/50ML IV EMUL
INTRAVENOUS | Status: AC
  Filled 2019-04-27: qty 50

## 2019-04-27 MED ORDER — ONDANSETRON HCL 4 MG PO TABS: 4.0000 mg | ORAL_TABLET | Freq: Four times a day (QID) | ORAL | Status: DC | PRN

## 2019-04-27 MED ORDER — MAGNESIUM HYDROXIDE 400 MG/5ML PO SUSP
30.0000 mL | Freq: Every day | ORAL | Status: DC | PRN
  Filled 2019-04-27: qty 30

## 2019-04-27 MED ORDER — ONDANSETRON HCL 4 MG/2ML IJ SOLN: 4.0000 mg | Freq: Once | INTRAMUSCULAR | Status: DC | PRN

## 2019-04-27 MED ORDER — TRAMADOL HCL 50 MG PO TABS
50.0000 mg | ORAL_TABLET | Freq: Four times a day (QID) | ORAL | Status: DC
  Administered 2019-04-27 – 2019-04-29 (×8): 50 mg via ORAL
  Filled 2019-04-27 (×8): qty 1

## 2019-04-27 MED ORDER — FLUTICASONE PROPIONATE 50 MCG/ACT NA SUSP
2.0000 | Freq: Every day | NASAL | Status: DC | PRN
  Filled 2019-04-27: qty 16

## 2019-04-27 MED ORDER — DEXAMETHASONE SODIUM PHOSPHATE 10 MG/ML IJ SOLN
INTRAMUSCULAR | Status: DC | PRN
  Administered 2019-04-27: 5 mg via INTRAVENOUS

## 2019-04-27 MED ORDER — BUPIVACAINE-EPINEPHRINE (PF) 0.5% -1:200000 IJ SOLN
INTRAMUSCULAR | Status: DC | PRN
  Administered 2019-04-27: 30 mL

## 2019-04-27 MED ORDER — PROPOFOL 10 MG/ML IV BOLUS
INTRAVENOUS | Status: DC | PRN
  Administered 2019-04-27: 120 mg via INTRAVENOUS

## 2019-04-27 MED ORDER — GABAPENTIN 300 MG PO CAPS
600.0000 mg | ORAL_CAPSULE | Freq: Every day | ORAL | Status: DC
  Administered 2019-04-27 – 2019-04-28 (×2): 600 mg via ORAL
  Filled 2019-04-27 (×2): qty 2

## 2019-04-27 MED ORDER — CEFAZOLIN SODIUM-DEXTROSE 2-4 GM/100ML-% IV SOLN
2.0000 g | Freq: Four times a day (QID) | INTRAVENOUS | Status: AC
  Administered 2019-04-27 – 2019-04-28 (×3): 2 g via INTRAVENOUS
  Filled 2019-04-27 (×3): qty 100

## 2019-04-27 MED ORDER — ALBUTEROL SULFATE (2.5 MG/3ML) 0.083% IN NEBU: 2.5000 mg | INHALATION_SOLUTION | RESPIRATORY_TRACT | Status: DC | PRN

## 2019-04-27 MED ORDER — FLEET ENEMA 7-19 GM/118ML RE ENEM: 1.0000 | ENEMA | Freq: Once | RECTAL | Status: DC | PRN

## 2019-04-27 MED ORDER — PANTOPRAZOLE SODIUM 40 MG PO TBEC
40.0000 mg | DELAYED_RELEASE_TABLET | Freq: Every day | ORAL | Status: DC
  Administered 2019-04-28 – 2019-04-29 (×2): 40 mg via ORAL
  Filled 2019-04-27 (×2): qty 1

## 2019-04-27 MED ORDER — SUCCINYLCHOLINE CHLORIDE 20 MG/ML IJ SOLN
INTRAMUSCULAR | Status: AC
  Filled 2019-04-27: qty 1

## 2019-04-27 MED ORDER — DEXAMETHASONE SODIUM PHOSPHATE 10 MG/ML IJ SOLN
INTRAMUSCULAR | Status: AC
  Filled 2019-04-27: qty 1

## 2019-04-27 MED ORDER — ESCITALOPRAM OXALATE 10 MG PO TABS
10.0000 mg | ORAL_TABLET | Freq: Every day | ORAL | Status: DC
  Administered 2019-04-28 – 2019-04-29 (×2): 10 mg via ORAL
  Filled 2019-04-27 (×3): qty 1

## 2019-04-27 MED ORDER — VITAMIN D 25 MCG (1000 UNIT) PO TABS
1000.0000 [IU] | ORAL_TABLET | Freq: Every day | ORAL | Status: DC
  Administered 2019-04-28 – 2019-04-29 (×2): 1000 [IU] via ORAL
  Filled 2019-04-27 (×2): qty 1

## 2019-04-27 MED ORDER — ONDANSETRON HCL 4 MG/2ML IJ SOLN
INTRAMUSCULAR | Status: DC | PRN
  Administered 2019-04-27 (×2): 4 mg via INTRAVENOUS

## 2019-04-27 MED ORDER — ACETAMINOPHEN 500 MG PO TABS
500.0000 mg | ORAL_TABLET | Freq: Four times a day (QID) | ORAL | Status: AC
  Administered 2019-04-27 – 2019-04-28 (×4): 500 mg via ORAL
  Filled 2019-04-27 (×4): qty 1

## 2019-04-27 MED ORDER — MORPHINE SULFATE (PF) 2 MG/ML IV SOLN: 2.0000 mg | INTRAVENOUS | Status: DC | PRN

## 2019-04-27 MED ORDER — SODIUM CHLORIDE 0.9 % IV SOLN
INTRAVENOUS | Status: DC | PRN
  Administered 2019-04-27: 09:00:00 60 mL

## 2019-04-27 MED ORDER — FENTANYL CITRATE (PF) 100 MCG/2ML IJ SOLN
INTRAMUSCULAR | Status: DC | PRN
  Administered 2019-04-27 (×4): 50 ug via INTRAVENOUS

## 2019-04-27 MED ORDER — LIDOCAINE HCL (CARDIAC) PF 100 MG/5ML IV SOSY
PREFILLED_SYRINGE | INTRAVENOUS | Status: DC | PRN
  Administered 2019-04-27: 60 mg via INTRAVENOUS

## 2019-04-27 MED ORDER — MIDAZOLAM HCL 2 MG/2ML IJ SOLN
INTRAMUSCULAR | Status: AC
  Filled 2019-04-27: qty 2

## 2019-04-27 MED ORDER — SUCCINYLCHOLINE CHLORIDE 20 MG/ML IJ SOLN
INTRAMUSCULAR | Status: DC | PRN
  Administered 2019-04-27: 80 mg via INTRAVENOUS

## 2019-04-27 MED ORDER — KETOROLAC TROMETHAMINE 15 MG/ML IJ SOLN
15.0000 mg | Freq: Once | INTRAMUSCULAR | Status: AC
  Administered 2019-04-27: 10:00:00 15 mg via INTRAVENOUS

## 2019-04-27 MED ORDER — FENTANYL CITRATE (PF) 100 MCG/2ML IJ SOLN
25.0000 ug | INTRAMUSCULAR | Status: DC | PRN
  Administered 2019-04-27 (×3): 25 ug via INTRAVENOUS

## 2019-04-27 MED ORDER — BLACK ELDERBERRY 50 MG/5ML PO SYRP: ORAL_SOLUTION | Freq: Every day | ORAL | Status: DC

## 2019-04-27 MED ORDER — ROCURONIUM BROMIDE 50 MG/5ML IV SOLN
INTRAVENOUS | Status: AC
  Filled 2019-04-27: qty 2

## 2019-04-27 SURGICAL SUPPLY — 61 items
BANDAGE ELASTIC 6 LF NS (GAUZE/BANDAGES/DRESSINGS) ×3 IMPLANT
BEARING TIBIAL VG CR 71/75XX16 (Joint) ×1 IMPLANT
BLADE SAW SAG 25X90X1.19 (BLADE) ×3 IMPLANT
BLADE SURG SZ20 CARB STEEL (BLADE) ×3 IMPLANT
CANISTER SUCT 1200ML W/VALVE (MISCELLANEOUS) ×3 IMPLANT
CANISTER SUCT 3000ML PPV (MISCELLANEOUS) ×3 IMPLANT
CEMENT BONE R 1X40 (Cement) ×6 IMPLANT
CEMENT VACUUM MIXING SYSTEM (MISCELLANEOUS) ×3 IMPLANT
CHLORAPREP W/TINT 26 (MISCELLANEOUS) ×3 IMPLANT
COOLER POLAR GLACIER W/PUMP (MISCELLANEOUS) ×3 IMPLANT
COVER MAYO STAND STRL (DRAPES) ×3 IMPLANT
COVER WAND RF STERILE (DRAPES) ×3 IMPLANT
CUFF TOURN SGL QUICK 24 (TOURNIQUET CUFF)
CUFF TOURN SGL QUICK 30 (TOURNIQUET CUFF) ×2
CUFF TRNQT CYL 24X4X16.5-23 (TOURNIQUET CUFF) IMPLANT
CUFF TRNQT CYL 30X4X21-28X (TOURNIQUET CUFF) ×1 IMPLANT
DRAPE IMP U-DRAPE 54X76 (DRAPES) ×3 IMPLANT
DRAPE SHEET LG 3/4 BI-LAMINATE (DRAPES) ×3 IMPLANT
DRSG OPSITE POSTOP 4X10 (GAUZE/BANDAGES/DRESSINGS) ×6 IMPLANT
DRSG OPSITE POSTOP 4X8 (GAUZE/BANDAGES/DRESSINGS) IMPLANT
ELECT CAUTERY BLADE 6.4 (BLADE) ×3 IMPLANT
ELECT REM PT RETURN 9FT ADLT (ELECTROSURGICAL) ×3
ELECTRODE REM PT RTRN 9FT ADLT (ELECTROSURGICAL) ×1 IMPLANT
FEMORAL COMPONENT 70MM RIGHT (Joint) ×3 IMPLANT
GLOVE BIO SURGEON STRL SZ7.5 (GLOVE) ×12 IMPLANT
GLOVE BIO SURGEON STRL SZ8 (GLOVE) ×12 IMPLANT
GLOVE BIOGEL PI IND STRL 8 (GLOVE) ×1 IMPLANT
GLOVE BIOGEL PI INDICATOR 8 (GLOVE) ×2
GLOVE INDICATOR 8.0 STRL GRN (GLOVE) ×3 IMPLANT
GOWN STRL REUS W/ TWL LRG LVL3 (GOWN DISPOSABLE) ×1 IMPLANT
GOWN STRL REUS W/ TWL XL LVL3 (GOWN DISPOSABLE) ×1 IMPLANT
GOWN STRL REUS W/TWL LRG LVL3 (GOWN DISPOSABLE) ×2
GOWN STRL REUS W/TWL XL LVL3 (GOWN DISPOSABLE) ×2
HOLDER FOLEY CATH W/STRAP (MISCELLANEOUS) IMPLANT
HOOD PEEL AWAY FLYTE STAYCOOL (MISCELLANEOUS) ×9 IMPLANT
IMMBOLIZER KNEE 19 BLUE UNIV (SOFTGOODS) ×3 IMPLANT
KIT TURNOVER KIT A (KITS) ×3 IMPLANT
NDL SAFETY ECLIPSE 18X1.5 (NEEDLE) ×2 IMPLANT
NEEDLE HYPO 18GX1.5 SHARP (NEEDLE) ×4
NEEDLE SPNL 20GX3.5 QUINCKE YW (NEEDLE) ×3 IMPLANT
NS IRRIG 1000ML POUR BTL (IV SOLUTION) ×3 IMPLANT
PACK TOTAL KNEE (MISCELLANEOUS) ×3 IMPLANT
PAD WRAPON POLAR KNEE (MISCELLANEOUS) ×1 IMPLANT
PATELLA STD 34X8.5 (Orthopedic Implant) ×3 IMPLANT
PLATE KNEE TIBIAL 75MM FIXED (Plate) ×3 IMPLANT
PULSAVAC PLUS IRRIG FAN TIP (DISPOSABLE) ×3
SOL .9 NS 3000ML IRR  AL (IV SOLUTION) ×2
SOL .9 NS 3000ML IRR UROMATIC (IV SOLUTION) ×1 IMPLANT
STAPLER SKIN PROX 35W (STAPLE) ×3 IMPLANT
SUCTION FRAZIER HANDLE 10FR (MISCELLANEOUS) ×2
SUCTION TUBE FRAZIER 10FR DISP (MISCELLANEOUS) ×1 IMPLANT
SUT VIC AB 0 CT1 36 (SUTURE) ×12 IMPLANT
SUT VIC AB 2-0 CT1 27 (SUTURE) ×10
SUT VIC AB 2-0 CT1 TAPERPNT 27 (SUTURE) ×5 IMPLANT
SYR 10ML LL (SYRINGE) ×3 IMPLANT
SYR 20CC LL (SYRINGE) ×3 IMPLANT
SYR 30ML LL (SYRINGE) ×9 IMPLANT
TIBIAL BEARING VG CR 71/75XX16 (Joint) ×3 IMPLANT
TIP FAN IRRIG PULSAVAC PLUS (DISPOSABLE) ×1 IMPLANT
TRAY FOLEY MTR SLVR 16FR STAT (SET/KITS/TRAYS/PACK) IMPLANT
WRAPON POLAR PAD KNEE (MISCELLANEOUS) ×3

## 2019-04-27 NOTE — Progress Notes (Signed)
Admission Note: Pt admitted to room 106. Alert and oriented X4. VSS. No complaints of pain. Bed in lowest position, call bell in reach and bed alarm on.

## 2019-04-27 NOTE — Anesthesia Preprocedure Evaluation (Signed)
Anesthesia Evaluation  Patient identified by MRN, date of birth, ID band Patient awake    Reviewed: Allergy & Precautions, H&P , NPO status , Patient's Chart, lab work & pertinent test results, reviewed documented beta blocker date and time   History of Anesthesia Complications (+) PONV, Family history of anesthesia reaction and history of anesthetic complications  Airway Mallampati: II  TM Distance: >3 FB Neck ROM: full    Dental  (+) Teeth Intact   Pulmonary neg pulmonary ROS, asthma , former smoker,    Pulmonary exam normal        Cardiovascular Exercise Tolerance: Good hypertension, On Medications negative cardio ROS Normal cardiovascular exam Rhythm:regular Rate:Normal     Neuro/Psych  Neuromuscular disease negative neurological ROS  negative psych ROS   GI/Hepatic negative GI ROS, Neg liver ROS, GERD  Medicated,  Endo/Other  negative endocrine ROS  Renal/GU negative Renal ROS  negative genitourinary   Musculoskeletal   Abdominal   Peds  Hematology negative hematology ROS (+)   Anesthesia Other Findings Past Medical History: No date: Arthritis No date: Asthma No date: Bilateral ovarian cysts No date: Constipation No date: Family history of adverse reaction to anesthesia     Comment:  mother PONV No date: GERD (gastroesophageal reflux disease) No date: H/O seasonal allergies No date: History of kidney stones No date: Hypertension No date: PONV (postoperative nausea and vomiting) No date: Sinus problem Past Surgical History: 05/11/2018: ANTERIOR CERVICAL DECOMP/DISCECTOMY FUSION; N/A     Comment:  Procedure: ANTERIOR CERVICAL DECOMPRESSION/DISCECTOMY               FUSION 3 LEVELS-C4-7;  Surgeon: Meade Maw, MD;                Location: [facility] ORS;  Service: Neurosurgery;  Laterality:               N/A; No date: back; Bilateral     Comment:  cervical disc 85 or 86: [place]?: BREAST  EXCISIONAL BIOPSY; Right     Comment:  benign No date: CATARACT EXTRACTION 10/03/2015: LAPAROSCOPIC HYSTERECTOMY; Bilateral     Comment:  Procedure: HYSTERECTOMY TOTAL LAPAROSCOPIC, BILATERAL               SALPINGOOPHERECTOMY;  Surgeon: Honor Loh Ward, MD;                Location: [facility] ORS;  Service: Gynecology;  Laterality:               Bilateral; No date: nodules vocal cords 1984: TUBAL LIGATION BMI    Body Mass Index:  32.89 kg/m     Reproductive/Obstetrics negative OB ROS                             Anesthesia Physical Anesthesia Plan  ASA: III  Anesthesia Plan: General ETT   Post-op Pain Management:    Induction:   PONV Risk Score and Plan: 4 or greater  Airway Management Planned: Video Laryngoscope Planned  Additional Equipment:   Intra-op Plan:   Post-operative Plan:   Informed Consent: I have reviewed the patients History and Physical, chart, labs and discussed the procedure including the risks, benefits and alternatives for the proposed anesthesia with the patient or authorized representative who has indicated his/her understanding and acceptance.     Dental Advisory Given  Plan Discussed with: CRNA  Anesthesia Plan Comments: (Refuses RA secondary to hx of lbp.  Good AO  extension and no pain.  JA)        Anesthesia Quick Evaluation

## 2019-04-27 NOTE — TOC Benefit Eligibility Note (Signed)
Transition of Care [facility]) Benefit Eligibility Note    Patient Details  Name: MARGY SUMLER MRN: [id number] Date of Birth: 10-13-1946   Medication/Dose: Enoxaparin 40 mg, daily x 14 days, Lovenox 40 mg, daily x 14 days  Covered?: Yes  Tier: (Tier 1-Enoxaparin, Tier 3-Lovenox)  Prescription Coverage Preferred Pharmacy: Any Retail Pharmacy i.e. [facility], [facility], [facility]  Spoke with Person/Company/Phone Number:: T[employer] [place], [phone]  Co-Pay: $10 for Enoxaparin 40 mg, daily x 30 day supply  OR  $64 for Lovenox 40 mg, daily x 30 day supply  Prior Approval: No  Deductible: Met       Darius Bump Demara Lover Phone Number: 04/27/2019, 11:41 AM

## 2019-04-27 NOTE — H&P (Signed)
Paper H&P to be scanned into permanent record. H&P reviewed and patient re-examined. No changes. 

## 2019-04-27 NOTE — Anesthesia Procedure Notes (Signed)
Procedure Name: Intubation Date/Time: 04/27/2019 7:45 AM Performed by: Chanetta Marshall, CRNA Pre-anesthesia Checklist: Patient identified, Emergency Drugs available and Suction available Patient Re-evaluated:Patient Re-evaluated prior to induction Oxygen Delivery Method: Circle system utilized Preoxygenation: Pre-oxygenation with 100% oxygen Induction Type: IV induction Ventilation: Mask ventilation without difficulty Laryngoscope Size: McGraph and 3 Grade View: Grade I Tube size: 7.0 mm Number of attempts: 1 Airway Equipment and Method: Video-laryngoscopy and Stylet Placement Confirmation: ETT inserted through vocal cords under direct vision,  positive ETCO2,  CO2 detector and breath sounds checked- equal and bilateral Secured at: 21 cm Tube secured with: Tape Dental Injury: Teeth and Oropharynx as per pre-operative assessment

## 2019-04-27 NOTE — Anesthesia Post-op Follow-up Note (Signed)
Anesthesia QCDR form completed.        

## 2019-04-27 NOTE — Transfer of Care (Signed)
Immediate Anesthesia Transfer of Care Note  Patient: Brandi Newton  Procedure(s) Performed: TOTAL KNEE ARTHROPLASTY - RIGHT (Right )  Patient Location: PACU  Anesthesia Type:General  Level of Consciousness: awake and alert   Airway & Oxygen Therapy: Patient connected to face mask oxygen  Post-op Assessment: Report given to RN and Post -op Vital signs reviewed and stable  Post vital signs: Reviewed and stable  Last Vitals:  Vitals Value Taken Time  BP    Temp    Pulse    Resp    SpO2      Last Pain:  Vitals:   04/27/19 0620  TempSrc: Temporal  PainSc: 6          Complications: No apparent anesthesia complications

## 2019-04-27 NOTE — Evaluation (Signed)
Physical Therapy Evaluation Patient Details Name: Brandi Newton MRN: [id number] DOB: 1946/06/28 Today's Date: 04/27/2019   History of Present Illness  PT is 73 yo female s/p R TKA, WBAT. PMH of asthma, smoking, HTN, cervical fusion C4-C7, back surgery  Clinical Impression  Patient in bed, alert, oriented, behavior WFLs, pain in R knee ~2/10. Pt with KI in place, removed for exercises and ambulation (pt able to perform 10SLR with no physical assist). Pt performed bed level exercises with verbal and visual cues. Supine to sit with use of bed rails and minA for RLE to minimize pain. Sit <> stand x2 this session from bed and from [facility], RW and CGA utilized. Verbal and visual cues needed for hand placement for safe use of RW. Pt also ambulated ~72ft this session, CGA. Exhibited step to gait pattern with instructions in sequencing of movements to optimize RW and weight bearing on RLE.  Overall the patient demonstrated deficits (see "PT Problem List") that impede the patient's functional abilities, safety, and mobility and would benefit from skilled PT intervention. Recommendation is HHPT.      Follow Up Recommendations Home health PT    Equipment Recommendations  Rolling walker with 5" wheels(Pt reported having RW at home)    Recommendations for Other Services OT consult     Precautions / Restrictions Precautions Precautions: Fall Restrictions Weight Bearing Restrictions: No      Mobility  Bed Mobility Overal bed mobility: Needs Assistance Bed Mobility: Supine to Sit     Supine to sit: Min assist     General bed mobility comments: for RLE  Transfers Overall transfer level: Needs assistance Equipment used: Rolling walker (2 wheeled) Transfers: Sit to/from Stand Sit to Stand: Min guard         General transfer comment: x2 this session, first from bed, second from [facility]. Verbal cues for hand placement   Ambulation/Gait Ambulation/Gait assistance: Min guard Gait Distance  (Feet): 10 Feet Assistive device: Rolling walker (2 wheeled)       General Gait Details: Pt with step to gait pattern, verbal cues for sequencing  Stairs            Wheelchair Mobility    Modified Rankin (Stroke Patients Only)       Balance Overall balance assessment: Needs assistance Sitting-balance support: Feet supported Sitting balance-Leahy Scale: Fair       Standing balance-Leahy Scale: Poor                               Pertinent Vitals/Pain Pain Assessment: 0-10 Pain Score: 4  Pain Location: R knee Pain Descriptors / Indicators: Aching Pain Intervention(s): Limited activity within patient's tolerance;Monitored during session;Repositioned    Home Living Family/patient expects to be discharged to:: Private residence Living Arrangements: Spouse/significant other Available Help at Discharge: Available 24 hours/day Type of Home: House Home Access: Stairs to enter Entrance Stairs-Rails: None Entrance Stairs-Number of Steps: 1 Home Layout: Two level;Able to live on main level with bedroom/bathroom;Laundry or work area in [place]: Environmental consultant - 2 wheels;Cane - single point;Bedside commode;Shower seat      Prior Function Level of Independence: Independent               Hand Dominance   Dominant Hand: Right    Extremity/Trunk Assessment   Upper Extremity Assessment Upper Extremity Assessment: Overall WFL for tasks assessed    Lower Extremity Assessment Lower Extremity Assessment: LLE deficits/detail;RLE deficits/detail RLE  Deficits / Details: s/p RTKA LLE Deficits / Details: WFLs    Cervical / Trunk Assessment Cervical / Trunk Assessment: Normal  Communication   Communication: No difficulties  Cognition Arousal/Alertness: Awake/alert Behavior During Therapy: WFL for tasks assessed/performed Overall Cognitive Status: Within Functional Limits for tasks assessed                                         General Comments      Exercises Other Exercises Other Exercises: Demonstrated bedside commode transfer, verbal cues for hand placement, CGA, Supervision for self care in sitting   Assessment/Plan    PT Assessment Patient needs continued PT services  PT Problem List Decreased mobility;Decreased safety awareness;Decreased strength;Decreased range of motion;Decreased knowledge of precautions;Decreased activity tolerance;Decreased balance;Decreased knowledge of use of DME;Pain;Decreased cognition       PT Treatment Interventions DME instruction;Functional mobility training;Patient/family education;Balance training;Gait training;Therapeutic activities;Neuromuscular re-education;Stair training;Therapeutic exercise    PT Goals (Current goals can be found in the Care Plan section)  Acute Rehab PT Goals Patient Stated Goal: to go home PT Goal Formulation: With patient Time For Goal Achievement: 05/11/19 Potential to Achieve Goals: Good    Frequency BID   Barriers to discharge        Co-evaluation               AM-PAC PT "6 Clicks" Mobility  Outcome Measure Help needed turning from your back to your side while in a flat bed without using bedrails?: A Little Help needed moving from lying on your back to sitting on the side of a flat bed without using bedrails?: A Little Help needed moving to and from a bed to a chair (including a wheelchair)?: A Little Help needed standing up from a chair using your arms (e.g., wheelchair or bedside chair)?: A Little Help needed to walk in hospital room?: A Little Help needed climbing 3-5 steps with a railing? : A Lot 6 Click Score: 17    End of Session Equipment Utilized During Treatment: Gait belt Activity Tolerance: Patient tolerated treatment well Patient left: with call bell/phone within reach;with SCD's reapplied;in chair;with chair alarm set Nurse Communication: Mobility status PT Visit Diagnosis: Unsteadiness on feet  (R26.81);Muscle weakness (generalized) (M62.81);Pain;Other abnormalities of gait and mobility (R26.89) Pain - Right/Left: Right Pain - part of body: Knee    Time: 1452-1536 PT Time Calculation (min) (ACUTE ONLY): 44 min   Charges:   PT Evaluation $PT Eval Low Complexity: 1 Low PT Treatments $Gait Training: 8-22 mins $Therapeutic Exercise: 8-22 mins $Therapeutic Activity: 8-22 mins        Lieutenant Diego PT, DPT 4:24 PM,04/27/19 [phone]

## 2019-04-27 NOTE — Op Note (Addendum)
04/27/2019  10:01 AM  Patient:   Brandi Newton  Pre-Op Diagnosis:   Degenerative joint disease, right knee.  Post-Op Diagnosis:   Same  Procedure:   Right TKA using all-cemented [employer] Vanguard system with a 70 mm PCR femur, a 75 mm tibial tray with a 16 mm E-poly insert, and a 34 x 8.5 mm all-poly 3-pegged domed patella.  Surgeon:   Pascal Lux, MD  Assistant:   Cameron Proud, PA-C   Anesthesia:   GET  Findings:   As above  Complications:   None  EBL:   10 cc  Fluids:   700 cc crystalloid  UOP:   None  TT:   95 minutes at 300 mmHg  Drains:   None  Closure:   Staples  Implants:   As above  Brief Clinical Note:   The patient is a 73 year old female with a long history of progressively worsening right knee pain. The patient's symptoms have progressed despite medications, activity modification, injections, etc. The patient's history and examination were consistent with advanced degenerative joint disease of the right knee confirmed by plain radiographs. The patient presents at this time for a right total knee arthroplasty.  Procedure:   The patient was brought into the operating room and lain in the supine position. After adequate general endotracheal intubation and anesthesia were obtained, the right lower extremity was prepped with ChloraPrep solution and draped sterilely. Preoperative antibiotics were administered. A timeout was performed to verify the appropriate surgical site before the limb was exsanguinated with an Esmarch and the tourniquet inflated to 300 mmHg. A standard anterior approach to the knee was made through an approximately 7 inch incision. The incision was carried down through the subcutaneous tissues to expose superficial retinaculum. This was split the length of the incision and the medial flap elevated sufficiently to expose the medial retinaculum. The medial retinaculum was incised, leaving a 3-4 mm cuff of tissue on the patella. This was extended  distally along the medial border of the patellar tendon and proximally through the medial third of the quadriceps tendon. A subtotal fat pad excision was performed before the soft tissues were elevated off the anteromedial and anterolateral aspects of the proximal tibia to the level of the collateral ligaments. The anterior portions of the medial and lateral menisci were removed, as was the anterior cruciate ligament. With the knee flexed to 90, the external tibial guide was positioned and the appropriate proximal tibial cut made. This piece was taken to the back table where it was measured and found to be optimally replicated by a 75 mm component.  Attention was directed to the distal femur. The intramedullary canal was accessed through a 3/8" drill hole. The intramedullary guide was inserted and positioned in order to obtain a neutral flexion gap. The intercondylar block was positioned with care taken to avoid notching the anterior cortex of the femur. The appropriate cut was made. Next, the distal cutting block was placed at 6 of valgus alignment. Using the 9 mm slot, the distal cut was made. The distal femur was measured and found to be optimally replicated by the 70 mm component. The 70 mm 4-in-1 cutting block was positioned and first the posterior, then the posterior chamfer, the anterior chamfer, and finally the anterior cuts were made. At this point, the posterior portions medial and lateral menisci were removed. A trial reduction was performed using the appropriate femoral and tibial components with first the 10 mm insert, then the 12 mm,  the 14 mm, and finally the 16 mm insert. The 61mm insert demonstrated excellent stability to varus and valgus stressing both in flexion and extension while permitting full extension. Patella tracking was assessed and found to be excellent. Therefore, the tibial guide position was marked on the proximal tibia. The patella thickness was measured and found to be 22 mm.  Therefore, the appropriate cut was made. The patellar surface was measured and found to be optimally replicated by the 34 mm component. The three peg holes were drilled in place before the trial button was inserted. Patella tracking was assessed and found to be excellent, passing the "no thumb test". The lug holes were drilled into the distal femur before the trial component was removed, leaving only the tibial tray. The keel was then created using the appropriate tower, reamer, and punch.  The bony surfaces were prepared for cementing by irrigating them thoroughly with bacitracin saline solution via the jet lavage system. A bone plug was fashioned from some of the bone that had been removed previously and used to plug the distal femoral canal. In addition, 20 cc of Exparel diluted out to 60 cc with normal saline and 30 cc of 0.5% Sensorcaine were injected into the postero-medial and postero-lateral aspects of the knee, the medial and lateral gutter regions, and the peri-incisional tissues to help with postoperative analgesia. Meanwhile, the cement was being mixed on the back table. When it was ready, the tibial tray was cemented in first. The excess cement was removed using [employer] fast streamer. Next, the femoral component was impacted into place. Again, the excess cement was removed using [employer] fast streamer. The 16 mm trial insert was positioned and the knee brought into extension while the cement hardened. Finally, the patella was cemented into place and secured using the patellar clamp. Again, the excess cement was removed using [employer] fast streamer. Once the cement had hardened, the knee was placed through a range of motion with the findings as described above. Therefore, the trial insert was removed and, after verifying that no cement had been retained posteriorly, the permanent insert was positioned and secured using the appropriate key locking mechanism. Again the knee was placed through a range of motion with the  findings as described above.  The wound was copiously irrigated with bacitracin saline solution using the jet lavage system before the quadriceps tendon and retinacular layer were reapproximated using #0 Vicryl interrupted sutures. The superficial retinacular layer also was closed using a running #0 Vicryl suture. A total of 10 cc of transexemic acid (TXA) was injected intra-articularly before the subcutaneous tissues were closed in several layers using 2-0 Vicryl interrupted sutures. The skin was closed using staples. A sterile honeycomb dressing was applied to the skin before the leg was wrapped with an Ace wrap to accommodate the Polar Care device. The patient was then awakened and returned to the recovery room in satisfactory condition after tolerating the procedure well.

## 2019-04-28 DIAGNOSIS — Z96659 Presence of unspecified artificial knee joint: Secondary | ICD-10-CM | POA: Insufficient documentation

## 2019-04-28 LAB — CBC WITH DIFFERENTIAL/PLATELET
Abs Immature Granulocytes: 0.11 10*3/uL — ABNORMAL HIGH (ref 0.00–0.07)
Basophils Absolute: 0 10*3/uL (ref 0.0–0.1)
Basophils Relative: 0 %
Eosinophils Absolute: 0 10*3/uL (ref 0.0–0.5)
Eosinophils Relative: 0 %
HCT: 31.7 % — ABNORMAL LOW (ref 36.0–46.0)
Hemoglobin: 10.2 g/dL — ABNORMAL LOW (ref 12.0–15.0)
Immature Granulocytes: 1 %
Lymphocytes Relative: 14 %
Lymphs Abs: 2 10*3/uL (ref 0.7–4.0)
MCH: 29.7 pg (ref 26.0–34.0)
MCHC: 32.2 g/dL (ref 30.0–36.0)
MCV: 92.4 fL (ref 80.0–100.0)
Monocytes Absolute: 2.2 10*3/uL — ABNORMAL HIGH (ref 0.1–1.0)
Monocytes Relative: 16 %
Neutro Abs: 9.9 10*3/uL — ABNORMAL HIGH (ref 1.7–7.7)
Neutrophils Relative %: 69 %
Platelets: 226 10*3/uL (ref 150–400)
RBC: 3.43 MIL/uL — ABNORMAL LOW (ref 3.87–5.11)
RDW: 14.7 % (ref 11.5–15.5)
WBC: 14.3 10*3/uL — ABNORMAL HIGH (ref 4.0–10.5)
nRBC: 0 % (ref 0.0–0.2)

## 2019-04-28 LAB — BASIC METABOLIC PANEL
Anion gap: 6 (ref 5–15)
BUN: 34 mg/dL — ABNORMAL HIGH (ref 8–23)
CO2: 24 mmol/L (ref 22–32)
Calcium: 8.9 mg/dL (ref 8.9–10.3)
Chloride: 106 mmol/L (ref 98–111)
Creatinine, Ser: 1.47 mg/dL — ABNORMAL HIGH (ref 0.44–1.00)
GFR calc Af Amer: 41 mL/min — ABNORMAL LOW (ref >60)
GFR calc non Af Amer: 36 mL/min — ABNORMAL LOW (ref >60)
Glucose, Bld: 117 mg/dL — ABNORMAL HIGH (ref 70–99)
Potassium: 5 mmol/L (ref 3.5–5.1)
Sodium: 136 mmol/L (ref 135–145)

## 2019-04-28 MED ORDER — ENOXAPARIN SODIUM 40 MG/0.4ML ~~LOC~~ SOLN: 40.0000 mg | SUBCUTANEOUS | 0 refills | Status: DC

## 2019-04-28 MED ORDER — HYDROCODONE-ACETAMINOPHEN 7.5-325 MG PO TABS: 1.0000 | ORAL_TABLET | ORAL | 0 refills | Status: DC | PRN

## 2019-04-28 NOTE — Progress Notes (Signed)
  Subjective: 1 Day Post-Op Procedure(s) (LRB): TOTAL KNEE ARTHROPLASTY - RIGHT (Right) Patient reports pain as mild.   Patient is well, and has had no acute complaints or problems Plan is to go Home after hospital stay. Negative for chest pain and shortness of breath Fever: no Gastrointestinal:Negative for nausea and vomiting  Objective: Vital signs in last 24 hours: Temp:  [97.5 F (36.4 C)-99.2 F (37.3 C)] 98.4 F (36.9 C) (05/22 0523) Pulse Rate:  [65-85] 65 (05/22 0523) Resp:  [13-18] 18 (05/22 0523) BP: (137-155)/(64-91) 154/73 (05/22 0523) SpO2:  [90 %-100 %] 96 % (05/22 0523)  Intake/Output from previous day:  Intake/Output Summary (Last 24 hours) at 04/28/2019 0747 Last data filed at 04/28/2019 0735 Gross per 24 hour  Intake 2184.48 ml  Output 10 ml  Net 2174.48 ml    Intake/Output this shift: Total I/O In: 193.8 [I.V.:193.8] Out: -   Labs: Recent Labs    04/28/19 0346  HGB 10.2*   Recent Labs    04/28/19 0346  WBC 14.3*  RBC 3.43*  HCT 31.7*  PLT 226   Recent Labs    04/28/19 0346  NA 136  K 5.0  CL 106  CO2 24  BUN 34*  CREATININE 1.47*  GLUCOSE 117*  CALCIUM 8.9   No results for input(s): LABPT, INR in the last 72 hours.   EXAM General - Patient is Alert, Appropriate and Oriented Extremity - Neurovascular intact Sensation intact distally Intact pulses distally Dorsiflexion/Plantar flexion intact Incision: scant drainage No cellulitis present Dressing/Incision - blood tinged drainage Motor Function - intact, moving foot and toes well on exam.  Mild abdominal distention with normal BS. Patient is able to perform a straight leg raise without assistance this morning.  Past Medical History:  Diagnosis Date  . Arthritis   . Asthma   . Bilateral ovarian cysts   . Constipation   . Family history of adverse reaction to anesthesia    mother PONV  . GERD (gastroesophageal reflux disease)   . H/O seasonal allergies   . History of  kidney stones   . Hypertension   . PONV (postoperative nausea and vomiting)   . Sinus problem     Assessment/Plan: 1 Day Post-Op Procedure(s) (LRB): TOTAL KNEE ARTHROPLASTY - RIGHT (Right) Active Problems:   Status post total knee replacement using cement, right  Estimated body mass index is 32.89 kg/m as calculated from the following:   Height as of this encounter: 5\' 7"  (1.702 m).   Weight as of this encounter: 95.3 kg. Advance diet Up with therapy D/C IV fluids when tolerating po intake.  Labs reviewed this AM.  WBC 14.3.  She denies any SOB, chest pain, urinary symptoms or cough. Up with therapy today.  Plan for discharge home tomorrow. Pt is passing gas without pain, urinating without any issues. Begin working on BM.  DVT Prophylaxis - Lovenox, Foot Pumps and TED hose Weight-Bearing as tolerated to right leg  J. Cameron Proud, PA-C [facility] 04/28/2019, 7:47 AM

## 2019-04-28 NOTE — Progress Notes (Signed)
Physical Therapy Treatment Patient Details Name: Brandi Newton MRN: [id number] DOB: Nov 13, 1946 Today's Date: 04/28/2019    History of Present Illness PT is 73 yo female s/p R TKA, WBAT. PMH of asthma, smoking, HTN, cervical fusion C4-C7, back surgery    PT Comments    Pt able to progress to ambulating 170 feet with RW CGA and navigated 4 stairs (with UE support) with CGA safely after initial demo/cueing.  Increased effort/time noted in order to stand (pt reporting h/o L LE weakness); CGA provided for safety.  Pain 5-6/10 R knee end of session resting in bed.  Will continue to focus on strengthening, knee ROM, and progressive functional mobility during hospital stay.   Follow Up Recommendations  Home health PT     Equipment Recommendations  Rolling walker with 5" wheels    Recommendations for Other Services OT consult     Precautions / Restrictions Precautions Precautions: Fall Restrictions Weight Bearing Restrictions: Yes RLE Weight Bearing: Weight bearing as tolerated    Mobility  Bed Mobility Overal bed mobility: Modified Independent Bed Mobility: Supine to Sit;Sit to Supine     Supine to sit: Modified independent (Device/Increase time);HOB elevated Sit to supine: Modified independent (Device/Increase time);HOB elevated   General bed mobility comments: mild increased effort to move R LE but no physical assist required  Transfers Overall transfer level: Needs assistance Equipment used: Rolling walker (2 wheeled) Transfers: Sit to/from Stand Sit to Stand: Min guard         General transfer comment: increased effort to stand (from bed more than from recliner); x1 trial bed; x3 trials recliner  Ambulation/Gait Ambulation/Gait assistance: Min guard Gait Distance (Feet): 170 Feet Assistive device: Rolling walker (2 wheeled)   Gait velocity: mildly decreased   General Gait Details: mild decreased stance time R LE; initial vc's for R knee flexion during R LE  swing phase; good R heelstrike   Stairs Stairs: Yes Stairs assistance: Min guard Stair Management: Two rails;Step to pattern;Forwards;With walker Number of Stairs: 4 General stair comments: ascended/descended 3 steps with B railings CGA; ascended/descend 1 step with RW CGA; initial demo and vc's for technique and then pt able to perform without any further cueing   Wheelchair Mobility    Modified Rankin (Stroke Patients Only)       Balance Overall balance assessment: Needs assistance Sitting-balance support: No upper extremity supported;Feet supported Sitting balance-Leahy Scale: Good Sitting balance - Comments: steady sitting reaching within BOS   Standing balance support: No upper extremity supported Standing balance-Leahy Scale: Good Standing balance comment: steady standing taking off house coat                            Cognition Arousal/Alertness: Awake/alert Behavior During Therapy: WFL for tasks assessed/performed Overall Cognitive Status: Within Functional Limits for tasks assessed                                        Exercises Total Joint Exercises Short Arc Quad: AROM;Strengthening;Right;10 reps;Supine Heel Slides: AAROM;Strengthening;Right;10 reps;Supine Straight Leg Raises: AROM;Strengthening;Right;10 reps;Supine Knee Flexion: AAROM;Right;10 reps;Seated(paper towel under foot to improve sliding on floor) Goniometric ROM: R knee flexion AAROM 90 degrees in sitting; R knee extension near neutral    General Comments  Pt agreeable to PT session.      Pertinent Vitals/Pain Pain Assessment: 0-10 Pain Score: 6  Pain  Location: R knee Pain Descriptors / Indicators: Sore Pain Intervention(s): Limited activity within patient's tolerance;Monitored during session;Premedicated before session;Repositioned;Other (comment)(polar care applied and activated)    Home Living                      Prior Function            PT  Goals (current goals can now be found in the care plan section) Acute Rehab PT Goals Patient Stated Goal: to go home PT Goal Formulation: With patient Time For Goal Achievement: 05/11/19 Potential to Achieve Goals: Good Progress towards PT goals: Progressing toward goals    Frequency    BID      PT Plan Current plan remains appropriate    Co-evaluation              AM-PAC PT "6 Clicks" Mobility   Outcome Measure  Help needed turning from your back to your side while in a flat bed without using bedrails?: None Help needed moving from lying on your back to sitting on the side of a flat bed without using bedrails?: None Help needed moving to and from a bed to a chair (including a wheelchair)?: A Little Help needed standing up from a chair using your arms (e.g., wheelchair or bedside chair)?: A Little Help needed to walk in hospital room?: A Little Help needed climbing 3-5 steps with a railing? : A Little 6 Click Score: 20    End of Session Equipment Utilized During Treatment: Gait belt Activity Tolerance: Patient tolerated treatment well Patient left: in bed;with call bell/phone within reach;with bed alarm set;with SCD's reapplied;Other (comment)(B heels elevated via towel roll) Nurse Communication: Mobility status;Precautions;Weight bearing status PT Visit Diagnosis: Unsteadiness on feet (R26.81);Muscle weakness (generalized) (M62.81);Pain;Other abnormalities of gait and mobility (R26.89) Pain - Right/Left: Right Pain - part of body: Knee     Time: 0045-9977 PT Time Calculation (min) (ACUTE ONLY): 43 min  Charges:  $Gait Training: 23-37 mins $Therapeutic Exercise: 8-22 mins                   Leitha Bleak, PT 04/28/19, 2:47 PM [phone]

## 2019-04-28 NOTE — Discharge Instructions (Signed)

## 2019-04-28 NOTE — Discharge Summary (Addendum)
Physician Discharge Summary  Patient ID: Brandi Newton MRN: [id number] DOB/AGE: 02/12/1946 73 y.o.  Admit date: 04/27/2019 Discharge date: 04/29/19  Admission Diagnoses:  PRIMARY OSTEOARTHRITIS OF RIGHT KNEE  Discharge Diagnoses: Patient Active Problem List   Diagnosis Date Noted  . Status post total knee replacement using cement, right 04/27/2019  . Cervical radiculopathy 05/11/2018  . Lumbar radiculopathy 01/26/2018  . Bilateral carpal tunnel syndrome 01/24/2018  . S/P total hysterectomy and bilateral salpingo-oophorectomy 10/03/2015  . Primary osteoarthritis of both knees 01/24/2015  . Cough 02/14/2013  . Allergic rhinitis 02/14/2013  . GERD (gastroesophageal reflux disease) 02/14/2013  . 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 Arthritis   . Asthma   . Bilateral ovarian cysts   . Constipation   . Family history of adverse reaction to anesthesia    mother PONV  . GERD (gastroesophageal reflux disease)   . H/O seasonal allergies   . History of kidney stones   . Hypertension   . PONV (postoperative nausea and vomiting)   . Sinus problem      Transfusion: None.   Consultants (if any):   Discharged Condition: Improved  Hospital Course: Brandi Newton is an 73 y.o. female who was admitted 04/27/2019 with a diagnosis of primary osteoarthritis of the right knee and went to the operating room on 04/27/2019 and underwent the above named procedures.    Surgeries: Procedure(s): TOTAL KNEE ARTHROPLASTY - RIGHT on 04/27/2019 Patient tolerated the surgery well. Taken to PACU where she was stabilized and then transferred to the orthopedic floor.  Started on Lovenox 40mg  q 24 hrs. Foot pumps applied bilaterally at 80 mm. Heels elevated on bed with rolled towels. No evidence of DVT. Negative Homan. Physical therapy started on day #1 for gait training and transfer. OT started day #1 for ADL and assisted devices.  Patient's IV was d/c on POD2,  foley d/c on POD1.  Implants: Right TKA using all-cemented [employer] Vanguard system with a 70 mm PCR femur, a 75 mm tibial tray with a 16 mm E-poly insert, and a 34 x 8.5 mm all-poly 3-pegged domed patella.  She was given perioperative antibiotics:  Anti-infectives (From admission, onward)   Start     Dose/Rate Route Frequency Ordered Stop   04/27/19 1130  ceFAZolin (ANCEF) IVPB 2g/100 mL premix     2 g 200 mL/hr over 30 Minutes Intravenous Every 6 hours 04/27/19 1120 04/28/19 0042   04/27/19 0631  ceFAZolin (ANCEF) 2-4 GM/100ML-% IVPB    Note to Pharmacy:  Norton Blizzard  : cabinet override      04/27/19 0631 04/27/19 0750   04/27/19 0000  ceFAZolin (ANCEF) IVPB 2g/100 mL premix     2 g 200 mL/hr over 30 Minutes Intravenous  Once 04/26/19 2352 04/27/19 0750    .  She was given sequential compression devices, early ambulation, and Lovenox for DVT prophylaxis.  She benefited maximally from the hospital stay and there were no complications.    Recent vital signs:  Vitals:   04/28/19 1941 04/29/19 0241  BP: 125/69 (!) 130/49  Pulse: 70 (!) 44  Resp: 17 18  Temp: 98.2 F (36.8 C) 98.7 F (37.1 C)  SpO2: 97% 96%    Recent laboratory studies:  Lab Results  Component Value Date   HGB 10.2 (L) 04/28/2019   HGB 13.5 04/24/2019   HGB 13.7 05/03/2018   Lab Results  Component Value Date   WBC 14.3 (H) 04/28/2019  PLT 226 04/28/2019   Lab Results  Component Value Date   INR 0.99 05/03/2018   Lab Results  Component Value Date   NA 136 04/29/2019   K 4.0 04/29/2019   CL 105 04/29/2019   CO2 26 04/29/2019   BUN 30 (H) 04/29/2019   CREATININE 1.22 (H) 04/29/2019   GLUCOSE 109 (H) 04/29/2019    Discharge Medications:   Allergies as of 04/29/2019      Reactions   Percocet [oxycodone-acetaminophen] Nausea And Vomiting   Ace Inhibitors Cough      Medication List    STOP taking these medications   traMADol 50 MG tablet Commonly known as:  Ultram     TAKE these  medications   Calcium-Magnesium-Zinc 333.33-133.33-5 MG Tabs Take 1 tablet by mouth daily.   cetirizine 10 MG tablet Commonly known as:  ZYRTEC Take 10 mg by mouth daily as needed for allergies.   cholecalciferol 25 MCG (1000 UT) tablet Commonly known as:  VITAMIN D Take 1,000 Units by mouth daily.   cyanocobalamin 1000 MCG tablet Take 1,000 mcg by mouth daily.   enoxaparin 40 MG/0.4ML injection Commonly known as:  LOVENOX Inject 0.4 mLs (40 mg total) into the skin daily.   escitalopram 10 MG tablet Commonly known as:  LEXAPRO Take 10 mg by mouth daily.   fluticasone 50 MCG/ACT nasal spray Commonly known as:  FLONASE Place 2 sprays into both nostrils daily as needed for allergies.   gabapentin 300 MG capsule Commonly known as:  NEURONTIN Take 600 mg by mouth at bedtime.   hydrochlorothiazide 25 MG tablet Commonly known as:  HYDRODIURIL Take 25 mg by mouth daily.   HYDROcodone-acetaminophen 7.5-325 MG tablet Commonly known as:  NORCO Take 1-2 tablets by mouth every 4 (four) hours as needed for severe pain.   losartan 25 MG tablet Commonly known as:  COZAAR Take 25 mg by mouth daily.   naproxen 500 MG tablet Commonly known as:  NAPROSYN Take 500 mg by mouth 2 (two) times daily as needed for mild pain.   omeprazole 20 MG capsule Commonly known as:  PRILOSEC Take 20 mg by mouth daily as needed (heartburn).   polyethylene glycol 17 g packet Commonly known as:  MIRALAX / GLYCOLAX Take 17 g by mouth daily.   ProAir HFA 108 (90 Base) MCG/ACT inhaler Generic drug:  albuterol Inhale 2 puffs into the lungs every 4 (four) hours as needed for wheezing or shortness of breath.   SAMBUCUS ELDERBERRY PO Take 2 capsules by mouth daily.            Durable Medical Equipment  (From admission, onward)         Start     Ordered   04/27/19 1121  DME Bedside commode  Once    Question:  Patient needs a bedside commode to treat with the following condition  Answer:   Status post total knee replacement using cement, right   04/27/19 1120   04/27/19 1121  DME 3 n 1  Once     04/27/19 1120   04/27/19 1121  DME Walker rolling  Once    Question:  Patient needs a walker to treat with the following condition  Answer:  Status post total knee replacement using cement, right   04/27/19 1120         Diagnostic Studies: Dg Knee Right Port  Result Date: 04/27/2019 CLINICAL DATA:  73 year old female with a history of right knee replacement EXAM: PORTABLE RIGHT KNEE -  1-2 VIEW COMPARISON:  None. FINDINGS: Surgical changes of right knee arthroplasty. Gas within the surgical bed with soft tissue swelling. Surgical staples within the anterior midline of the knee. Components are congruent. No fracture identified. Surgical drain projects on the lateral view. IMPRESSION: Early surgical changes of right knee arthroplasty without complicating features. Electronically Signed   By: Corrie Mckusick D.O.   On: 04/27/2019 10:21   Disposition: Discharge disposition: 01-Home or Self Care     Plan for discharge home tomorrow, 04/29/19 pending progress with PT today.  Follow-up Information    Lattie Corns, PA-C Follow up in 14 day(s).   Specialty:  Physician Assistant Why:  Electa Sniff information: [place] [address] [phone]          Signed: Prescott Parma, Mahesh Sizemore PA-C 04/29/2019, 7:05 AM

## 2019-04-28 NOTE — Progress Notes (Signed)
Physical Therapy Treatment Patient Details Name: Brandi Newton MRN: [id number] DOB: 1946/03/23 Today's Date: 04/28/2019    History of Present Illness PT is 73 yo female s/p R TKA, WBAT. PMH of asthma, smoking, HTN, cervical fusion C4-C7, back surgery    PT Comments    Pt in bathroom upon PT arrival.  Performed transfers with CGA and minimal cueing during session.  Able to ambulate 100 feet with RW CGA with cueing to improve quality of gait pattern.  3/10 R knee pain beginning of session and 6/10 end of session (pt declined pain meds).  R knee AAROM to 90 degrees flexion.  Will continue to focus on strengthening and progressive functional mobility during hospital stay.    Follow Up Recommendations  Home health PT     Equipment Recommendations  Rolling walker with 5" wheels    Recommendations for Other Services OT consult     Precautions / Restrictions Precautions Precautions: Fall Restrictions Weight Bearing Restrictions: Yes RLE Weight Bearing: Weight bearing as tolerated    Mobility  Bed Mobility                  Transfers Overall transfer level: Needs assistance Equipment used: Rolling walker (2 wheeled) Transfers: Sit to/from Stand Sit to Stand: Min guard         General transfer comment: CGA from toilet and from recliner  Ambulation/Gait Ambulation/Gait assistance: Min guard Gait Distance (Feet): (25 feet; 100 feet) Assistive device: Rolling walker (2 wheeled)   Gait velocity: mildly decreased   General Gait Details: mild decreased stance time R LE; vc's for R knee flexion during R LE swing phase; good R heelstrike; initial vc's for positioning within RW   Stairs             Wheelchair Mobility    Modified Rankin (Stroke Patients Only)       Balance Overall balance assessment: Needs assistance Sitting-balance support: No upper extremity supported;Feet supported Sitting balance-Leahy Scale: Good Sitting balance - Comments: steady  sitting reaching within BOS   Standing balance support: No upper extremity supported Standing balance-Leahy Scale: Good Standing balance comment: steady standing washing hands at sink                            Cognition Arousal/Alertness: Awake/alert Behavior During Therapy: WFL for tasks assessed/performed Overall Cognitive Status: Within Functional Limits for tasks assessed                                        Exercises Total Joint Exercises Knee Flexion: AAROM;Right;10 reps;Seated(paper towel under foot to improve sliding on floor) Goniometric ROM: R knee flexion AAROM 90 degrees in sitting; R knee extension near neutral    General Comments   Nursing cleared pt for participation in physical therapy.  Pt agreeable to PT session.      Pertinent Vitals/Pain Pain Assessment: 0-10 Pain Score: 6  Pain Location: R knee Pain Descriptors / Indicators: Sore Pain Intervention(s): Limited activity within patient's tolerance;Monitored during session;Repositioned  Vitals (HR and O2 on room air) stable and WFL throughout treatment session.    Home Living                      Prior Function            PT Goals (current goals can now  be found in the care plan section) Acute Rehab PT Goals Patient Stated Goal: to go home PT Goal Formulation: With patient Time For Goal Achievement: 05/11/19 Potential to Achieve Goals: Good Progress towards PT goals: Progressing toward goals    Frequency    BID      PT Plan Current plan remains appropriate    Co-evaluation              AM-PAC PT "6 Clicks" Mobility   Outcome Measure  Help needed turning from your back to your side while in a flat bed without using bedrails?: A Little Help needed moving from lying on your back to sitting on the side of a flat bed without using bedrails?: A Little Help needed moving to and from a bed to a chair (including a wheelchair)?: A Little Help needed  standing up from a chair using your arms (e.g., wheelchair or bedside chair)?: A Little Help needed to walk in hospital room?: A Little Help needed climbing 3-5 steps with a railing? : A Little 6 Click Score: 18    End of Session Equipment Utilized During Treatment: Gait belt Activity Tolerance: Patient tolerated treatment well Patient left: in chair;with call bell/phone within reach;with chair alarm set;with SCD's reapplied;Other (comment)(R heel elevated via towel roll; L heel floating via pillow) Nurse Communication: Mobility status;Precautions;Weight bearing status PT Visit Diagnosis: Unsteadiness on feet (R26.81);Muscle weakness (generalized) (M62.81);Pain;Other abnormalities of gait and mobility (R26.89) Pain - Right/Left: Right Pain - part of body: Knee     Time: 7262-0355 PT Time Calculation (min) (ACUTE ONLY): 38 min  Charges:  $Gait Training: 8-22 mins $Therapeutic Exercise: 8-22 mins $Therapeutic Activity: 8-22 mins                    Leitha Bleak, PT 04/28/19, 12:58 PM [phone]

## 2019-04-28 NOTE — TOC Initial Note (Signed)
Transition of Care [facility]) - Initial/Assessment Note    Patient Details  Name: Brandi Newton MRN: [id number] Date of Birth: September 28, 1946  [facility]) CM/SW Contact:    Su Hilt, RN Phone Number: 04/28/2019, 9:45 AM  Clinical Narrative:                 Met with the patient to discuss DC plan and needs, she lives at home with spouse he will provide transportation  She has an old heavy walker at home and needs a RW, notified Brad with [employer] She has a BSC and a Multimedia programmer chair, she chose [facility], notified Helene Kelp Lovenox is 10 notified the patient of the price  Dr Daryel Gerald is PCP [place] is pharmacy of choice She can afford her medications CM to continue to follow for needs, anticipate DC 05/23  Expected Discharge Plan: [place] Barriers to Discharge: Continued Medical Work up   Patient Goals and CMS Choice Patient states their goals for this hospitalization and ongoing recovery are:: to go home CMS Medicare.gov Compare Post Acute Care list provided to:: Patient Choice offered to / list presented to : Patient  Expected Discharge Plan and Services Expected Discharge Plan: [place]   Discharge Planning Services: CM Consult Post Acute Care Choice: [place] arrangements for the past 2 months: Single Family Home                 DME Arranged: Walker rolling DME Agency: [employer] Date DME Agency Contacted: 04/28/19 Time DME Agency Contacted: [phone] Representative spoke with at DME Agency: [place]: PT [place]: [facility] ([facility] [employer]) Date [place]: 04/28/19 Time [place]: [phone] Representative spoke with at [place]: [place] Arrangements/Services Living arrangements for the past 2 months: [place] with:: Spouse Patient language and need for interpreter reviewed:: No Do you feel safe going back to the place where you  live?: Yes      Need for Family Participation in Patient Care: No (Comment) Care giver support system in place?: Yes (comment) Current home services: DME(bed side commode shower chair and a regular walker) Criminal Activity/Legal Involvement Pertinent to Current Situation/Hospitalization: No - Comment as needed  Activities of Daily Living Home Assistive Devices/Equipment: Cane (specify quad or straight) ADL Screening (condition at time of admission) Patient's cognitive ability adequate to safely complete daily activities?: Yes Is the patient deaf or have difficulty hearing?: No Does the patient have difficulty seeing, even when wearing glasses/contacts?: No Does the patient have difficulty concentrating, remembering, or making decisions?: No Patient able to express need for assistance with ADLs?: Yes Does the patient have difficulty dressing or bathing?: No Independently performs ADLs?: Yes (appropriate for developmental age) Does the patient have difficulty walking or climbing stairs?: Yes Weakness of Legs: Right Weakness of Arms/Hands: None  Permission Sought/Granted Permission sought to share information with : Case Manager Permission granted to share information with : Yes, Verbal Permission Granted              Emotional Assessment Appearance:: Appears stated age Attitude/Demeanor/Rapport: Engaged Affect (typically observed): Accepting Orientation: : Oriented to Self, Oriented to Place, Oriented to  Time, Oriented to Situation Alcohol / Substance Use: Never Used Psych Involvement: No (comment)  Admission diagnosis:  PRIMARY OSTEOARTHRITIS OF RIGHT KNEE Patient Active Problem List   Diagnosis Date Noted  . Status post total knee replacement using cement,  right 04/27/2019  . Cervical radiculopathy 05/11/2018  . Lumbar radiculopathy 01/26/2018  . Bilateral carpal tunnel syndrome 01/24/2018  . S/P total hysterectomy and bilateral salpingo-oophorectomy 10/03/2015  .  Primary osteoarthritis of both knees 01/24/2015  . Cough 02/14/2013  . Allergic rhinitis 02/14/2013  . GERD (gastroesophageal reflux disease) 02/14/2013  . Singer's nodule 02/14/2013   PCP:  Ranae Plumber, [place] Pharmacy:   [place] [place] [place] [address] [place] [address] Phone: [phone] Fax: [place] [address]), [place] - [place] ([place]) [place] [address] Phone: [phone] Fax: [phone]     Social Determinants of Health (SDOH) Interventions    Readmission Risk Interventions No flowsheet data found.

## 2019-04-29 LAB — BASIC METABOLIC PANEL
Anion gap: 5 (ref 5–15)
BUN: 30 mg/dL — ABNORMAL HIGH (ref 8–23)
CO2: 26 mmol/L (ref 22–32)
Calcium: 9 mg/dL (ref 8.9–10.3)
Chloride: 105 mmol/L (ref 98–111)
Creatinine, Ser: 1.22 mg/dL — ABNORMAL HIGH (ref 0.44–1.00)
GFR calc Af Amer: 52 mL/min — ABNORMAL LOW (ref >60)
GFR calc non Af Amer: 45 mL/min — ABNORMAL LOW (ref >60)
Glucose, Bld: 109 mg/dL — ABNORMAL HIGH (ref 70–99)
Potassium: 4 mmol/L (ref 3.5–5.1)
Sodium: 136 mmol/L (ref 135–145)

## 2019-04-29 NOTE — Progress Notes (Signed)
  Subjective: 2 Days Post-Op Procedure(s) (LRB): TOTAL KNEE ARTHROPLASTY - RIGHT (Right) Patient reports pain as mild.   Patient is well, and has had no acute complaints or problems Plan is to go Home after hospital stay. Negative for chest pain and shortness of breath Fever: no Gastrointestinal:Negative for nausea and vomiting  Objective: Vital signs in last 24 hours: Temp:  [98.2 F (36.8 C)-98.7 F (37.1 C)] 98.7 F (37.1 C) (05/23 0241) Pulse Rate:  [44-70] 44 (05/23 0241) Resp:  [17-18] 18 (05/23 0241) BP: (124-130)/(49-78) 130/49 (05/23 0241) SpO2:  [94 %-97 %] 96 % (05/23 0241)  Intake/Output from previous day:  Intake/Output Summary (Last 24 hours) at 04/29/2019 0702 Last data filed at 04/28/2019 1904 Gross per 24 hour  Intake 550.93 ml  Output -  Net 550.93 ml    Intake/Output this shift: No intake/output data recorded.  Labs: Recent Labs    04/28/19 0346  HGB 10.2*   Recent Labs    04/28/19 0346  WBC 14.3*  RBC 3.43*  HCT 31.7*  PLT 226   Recent Labs    04/28/19 0346 04/29/19 0542  NA 136 136  K 5.0 4.0  CL 106 105  CO2 24 26  BUN 34* 30*  CREATININE 1.47* 1.22*  GLUCOSE 117* 109*  CALCIUM 8.9 9.0   No results for input(s): LABPT, INR in the last 72 hours.   EXAM General - Patient is Alert, Appropriate and Oriented Extremity - Neurovascular intact Sensation intact distally Intact pulses distally Dorsiflexion/Plantar flexion intact Incision: scant drainage No cellulitis present Dressing/Incision - blood tinged drainage with the Ace wrap removed. Motor Function - intact, moving foot and toes well on exam.  Mild abdominal distention with normal BS. Patient is able to perform a straight leg raise without assistance this morning.  Past Medical History:  Diagnosis Date  . Arthritis   . Asthma   . Bilateral ovarian cysts   . Constipation   . Family history of adverse reaction to anesthesia    mother PONV  . GERD (gastroesophageal  reflux disease)   . H/O seasonal allergies   . History of kidney stones   . Hypertension   . PONV (postoperative nausea and vomiting)   . Sinus problem     Assessment/Plan: 2 Days Post-Op Procedure(s) (LRB): TOTAL KNEE ARTHROPLASTY - RIGHT (Right) Active Problems:   Status post total knee replacement using cement, right  Estimated body mass index is 32.89 kg/m as calculated from the following:   Height as of this encounter: 5\' 7"  (1.702 m).   Weight as of this encounter: 95.3 kg. Advance diet Up with therapy D/C IV fluids when tolerating po intake.  She denies any SOB, chest pain, urinary symptoms or cough. Up with therapy today.  Plan for discharge home today. Patient has had a bowel movement.  DVT Prophylaxis - Lovenox, Foot Pumps and TED hose Weight-Bearing as tolerated to right leg  J. Cameron Proud, PA-C [facility] Orthopaedic Surgery 04/29/2019, 7:02 AM

## 2019-04-29 NOTE — Progress Notes (Signed)
Patient discharging home. IV removed, instructions and prescriptions given to patient, verbalized understanding. Dressing changed on right knee, clean dry, and intact. Patient's husband to come pick up patient and transport home.

## 2019-04-29 NOTE — Progress Notes (Signed)
Physical Therapy Treatment Patient Details Name: Brandi Newton MRN: [id number] DOB: Oct 20, 1946 Today's Date: 04/29/2019    History of Present Illness PT is 73 yo female s/p R TKA, WBAT. PMH of asthma, smoking, HTN, cervical fusion C4-C7, back surgery    PT Comments    Pt reporting 6-7/10 R knee pain beginning of session at rest (received pain meds earlier).  Able to stand with SBA and then ambulate 190 feet with RW SBA.  Pt reporting becoming hot ambulating in hallway and needing to sit prior to making it back to room so chair brought to pt and then pt returned to room in recliner (per pt description, anticipate pt became overly hot d/t wearing mask; HR and O2 sats on room air [facility]; nurse notified).  Overall pt steady and safe with functional mobility using RW (walker adjusted and fit to pt for home use).  Stairs practice offered to pt but pt declined today (pt performed stairs yesterday and pt reporting feeling good about ability to perform step into home with walker and would have 2 assist present); stairs technique reviewed with pt and pt verbalizing appropriate understanding.  5-6/10 R knee pain end of session resting in recliner.  R knee flexion AAROM to 95 degrees today.  Pt reporting no therapy questions or concerns for discharge home and has issued written HEP.  Plan to discharge home today.      Follow Up Recommendations  Home health PT     Equipment Recommendations  Rolling walker with 5" wheels    Recommendations for Other Services       Precautions / Restrictions Precautions Precautions: Fall Restrictions Weight Bearing Restrictions: Yes RLE Weight Bearing: Weight bearing as tolerated    Mobility  Bed Mobility               General bed mobility comments: Deferred (pt modified independent yesterday)  Transfers Overall transfer level: Needs assistance Equipment used: Rolling walker (2 wheeled) Transfers: Sit to/from [facility] Sit to Stand:  Supervision Stand pivot transfers: Supervision       General transfer comment: mild increased effort to stand (x2 trials); no vc's required  Ambulation/Gait Ambulation/Gait assistance: Supervision Gait Distance (Feet): 190 Feet Assistive device: Rolling walker (2 wheeled)   Gait velocity: mildly decreased   General Gait Details: mild decreased stance time R LE; step to gait pattern; R LE appearing "stiffer" initially but improved with increased distance ambulating   Stairs Stairs: (Pt reporting feeling comfortable with stairs so deferred)           Wheelchair Mobility    Modified Rankin (Stroke Patients Only)       Balance Overall balance assessment: Needs assistance Sitting-balance support: No upper extremity supported;Feet supported Sitting balance-Leahy Scale: Normal Sitting balance - Comments: steady sitting reaching outside BOS   Standing balance support: No upper extremity supported Standing balance-Leahy Scale: Good Standing balance comment: steady standing reaching within BOS                            Cognition Arousal/Alertness: Awake/alert Behavior During Therapy: WFL for tasks assessed/performed Overall Cognitive Status: Within Functional Limits for tasks assessed                                        Exercises Total Joint Exercises Goniometric ROM: R knee extension 3 degrees short  of neutral; R knee flexion AAROM 95 degrees sitting in recliner    General Comments   Nursing cleared pt for participation in physical therapy.  Pt agreeable to PT session.      Pertinent Vitals/Pain Pain Assessment: 0-10 Pain Score: 6  Pain Location: R knee Pain Descriptors / Indicators: Sore Pain Intervention(s): Limited activity within patient's tolerance;Monitored during session;Premedicated before session;Repositioned;Other (comment)(polar care applied and activated)  Vitals (HR and O2 on room air) stable and WFL throughout  treatment session.    Home Living                      Prior Function            PT Goals (current goals can now be found in the care plan section) Acute Rehab PT Goals Patient Stated Goal: to go home PT Goal Formulation: With patient Time For Goal Achievement: 05/11/19 Potential to Achieve Goals: Good Progress towards PT goals: Progressing toward goals    Frequency    BID      PT Plan Current plan remains appropriate    Co-evaluation              AM-PAC PT "6 Clicks" Mobility   Outcome Measure  Help needed turning from your back to your side while in a flat bed without using bedrails?: None Help needed moving from lying on your back to sitting on the side of a flat bed without using bedrails?: None Help needed moving to and from a bed to a chair (including a wheelchair)?: A Little Help needed standing up from a chair using your arms (e.g., wheelchair or bedside chair)?: A Little Help needed to walk in hospital room?: A Little Help needed climbing 3-5 steps with a railing? : A Little 6 Click Score: 20    End of Session Equipment Utilized During Treatment: Gait belt Activity Tolerance: Patient tolerated treatment well Patient left: in chair;with call bell/phone within reach;with chair alarm set;with SCD's reapplied;Other (comment)(B heels elevated via towel rolls) Nurse Communication: Mobility status;Precautions;Weight bearing status PT Visit Diagnosis: Unsteadiness on feet (R26.81);Muscle weakness (generalized) (M62.81);Pain;Other abnormalities of gait and mobility (R26.89) Pain - Right/Left: Right Pain - part of body: Knee     Time: 0981-1914 PT Time Calculation (min) (ACUTE ONLY): 38 min  Charges:  $Gait Training: 8-22 mins $Therapeutic Exercise: 8-22 mins $Therapeutic Activity: 8-22 mins                    Leitha Bleak, PT 04/29/19, 9:43 AM [phone]

## 2019-04-29 NOTE — TOC Transition Note (Signed)
Transition of Care [facility]) - CM/SW Discharge Note   Patient Details  Name: [name] MRN: [id number] Date of Birth: 1946/09/09  Transition of [facility] CM/SW Contact:  Latanya Maudlin, RN Phone Number: 04/29/2019, 8:29 AM   Clinical Narrative: Patient to be discharged per MD order. Orders in place for home health services. Patient was previously set [facility] with [facility] for PT services, verified completed referral with Helene Kelp. RW delivered to bedside via [employer].Lovenox price confirmed for $10. Family to transport and has supports from spouse.          Final next level of care: [place] Barriers to Discharge: No Barriers Identified   Patient Goals and CMS Choice Patient states their goals for this hospitalization and ongoing recovery are:: to go home CMS Medicare.gov Compare Post Acute Care list provided to:: Patient Choice offered to / list presented to : Patient  Discharge Placement                       Discharge Plan and Services   Discharge Planning Services: CM Consult Post Acute Care Choice: Home Health          DME Arranged: Walker rolling DME Agency: [employer] Date DME Agency Contacted: 04/28/19 Time DME Agency Contacted: [phone] Representative spoke with at DME Agency: [place]: PT [place]: [facility] Date [place]: 04/29/19 Time [place]: [phone] Representative spoke with at [place]: [place] ([place]) Interventions     Readmission Risk Interventions Readmission Risk Prevention Plan 04/29/2019  Post Dischage Appt Complete  Medication Screening Complete  Transportation Screening Complete  Some recent data might be hidden

## 2019-05-03 NOTE — Anesthesia Postprocedure Evaluation (Signed)
Anesthesia Post Note  Patient: HILA BOLDING  Procedure(s) Performed: TOTAL KNEE ARTHROPLASTY - RIGHT (Right )  Patient location during evaluation: PACU Anesthesia Type: General Level of consciousness: awake and alert Pain management: pain level controlled Vital Signs Assessment: post-procedure vital signs reviewed and stable Respiratory status: spontaneous breathing, nonlabored ventilation, respiratory function stable and patient connected to nasal cannula oxygen Cardiovascular status: blood pressure returned to baseline and stable Postop Assessment: no apparent nausea or vomiting Anesthetic complications: no     Last Vitals:  Vitals:   04/28/19 1941 04/29/19 0241  BP: 125/69 (!) 130/49  Pulse: 70 (!) 44  Resp: 17 18  Temp: 36.8 C 37.1 C  SpO2: 97% 96%    Last Pain:  Vitals:   04/29/19 0727  TempSrc:   PainSc: 3                  Molli Barrows

## 2019-06-26 ENCOUNTER — Other Ambulatory Visit: Payer: Self-pay | Admitting: Family Medicine

## 2019-06-26 DIAGNOSIS — Z1231 Encounter for screening mammogram for malignant neoplasm of breast: Secondary | ICD-10-CM

## 2019-07-27 ENCOUNTER — Ambulatory Visit
Admission: RE | Admit: 2019-07-27 | Discharge: 2019-07-27 | Disposition: A | Discharge status: Home / Self Care | Payer: Medicare Other | Source: Ambulatory Visit | Attending: Family Medicine | Admitting: Family Medicine

## 2019-07-27 DIAGNOSIS — Z1231 Encounter for screening mammogram for malignant neoplasm of breast: Secondary | ICD-10-CM | POA: Diagnosis not present

## 2020-04-19 ENCOUNTER — Other Ambulatory Visit: Payer: Self-pay | Admitting: Surgery

## 2020-05-01 ENCOUNTER — Encounter
Admission: RE | Admit: 2020-05-01 | Discharge: 2020-05-01 | Disposition: A | Discharge status: Home / Self Care | Payer: Medicare PPO | Source: Ambulatory Visit | Attending: Surgery | Admitting: Surgery

## 2020-05-01 ENCOUNTER — Other Ambulatory Visit: Payer: Self-pay

## 2020-05-01 DIAGNOSIS — Z01818 Encounter for other preprocedural examination: Secondary | ICD-10-CM | POA: Insufficient documentation

## 2020-05-01 HISTORY — DX: Bronchitis, not specified as acute or chronic: J40

## 2020-05-01 HISTORY — DX: Anemia, unspecified: D64.9

## 2020-05-01 NOTE — Patient Instructions (Addendum)
Your procedure is scheduled on: 05-14-20 TUESDAY Report to Same Day Surgery 2nd floor medical mall [facility] Entrance-take elevator on left to 2nd floor.  Check in with surgery information desk.) To find out your arrival time please call [phone] between 1PM - 3PM on 05-13-20 MONDAY  Remember: Instructions that are not followed completely may result in serious medical risk, up to and including death, or upon the discretion of your surgeon and anesthesiologist your surgery may need to be rescheduled.    _x___ 1. Do not eat food after midnight the night before your procedure. NO GUM OR CANDY AFTER SURGERY. You may drink clear liquids up to 2 hours before you are scheduled to arrive at the hospital for your procedure.  Do not drink clear liquids within 2 hours of your scheduled arrival to the hospital.  Clear liquids include  --Water or Apple juice without pulp  --Gatorade  --Black Coffee or Clear Tea (No milk, no creamers, do not add anything to the coffee or Tea-ok to add sugar)   ____Ensure clear carbohydrate drink on the way to the hospital for bariatric patients  _X___Ensure clear carbohydrate drink-FINISH DRINK 2 HOURS PRIOR TO ARRIVAL [place]    __x__ 2. No Alcohol for 24 hours before or after surgery.   __x__3. No Smoking or e-cigarettes for 24 prior to surgery.  Do not use any chewable tobacco products for at least 6 hour prior to surgery   ____  4. Bring all medications with you on the day of surgery if instructed.    __x__ 5. Notify your doctor if there is any change in your medical condition     (cold, fever, infections).    x___6. On the morning of surgery brush your teeth with toothpaste and water.  You may rinse your mouth with mouth wash if you wish.  Do not swallow any toothpaste or mouthwash.   Do not wear jewelry, make-up, hairpins, clips or nail polish.  Do not wear lotions, powders, or perfumes.   Do not shave 48 hours prior to  surgery. Men may shave face and neck.  Do not bring valuables to the hospital.    [facility] is not responsible for any belongings or valuables.               Contacts, dentures or bridgework may not be worn into surgery.  Leave your suitcase in the car. After surgery it may be brought to your room.  For patients admitted to the hospital, discharge time is determined by your  treatment team.  _  Patients discharged the day of surgery will not be allowed to drive home.  You will need someone to drive you home and stay with you the night of your procedure.    Please read over the following fact sheets that you were given:   [facility] Preparing for Surgery and MRSA Information/INCENTIVE SPIROMETER INSTRUCTIONS   _x___ TAKE THE FOLLOWING MEDICATION THE MORNING OF SURGERY WITH A SMALL SIP OF WATER. These include:  1. ZYRTEC (CETIRIZINE)  2. LEXAPRO (ESCITALOPRAM)  3.  4.  5.  6.  ____Fleets enema or Magnesium Citrate as directed.   _x___ Use CHG Soap or sage wipes as directed on instruction sheet   _X___ Use inhalers on the day of surgery and bring to hospital day of surgery-USE YOUR ALBUTEROL INHALER DAY OF SURGERY AND [place]  ____ Stop Metformin and Janumet 2 days prior to surgery.  ____ Take 1/2 of usual insulin dose the night before surgery and none on the morning surgery.   ____ Follow recommendations from Cardiologist, Pulmonologist or PCP regarding stopping Aspirin, Coumadin, Plavix ,Eliquis, Effient, or Pradaxa, and Pletal.  X____Stop Anti-inflammatories such as Advil, Aleve, Ibuprofen, Motrin, Naproxen, Naprosyn, Goodies powders or aspirin products 7 DAYS PRIOR TO SURGERY-OK to take Tylenol    _X___ Stop supplements until after surgery-BLACK ELDERBERRY HAS ALREADY BEEN STOPPED    ____ Bring C-Pap to the hospital.

## 2020-05-03 ENCOUNTER — Other Ambulatory Visit: Payer: Self-pay | Admitting: Surgery

## 2020-05-03 ENCOUNTER — Encounter
Admission: RE | Admit: 2020-05-03 | Discharge: 2020-05-03 | Disposition: A | Discharge status: Home / Self Care | Payer: Medicare PPO | Source: Ambulatory Visit

## 2020-05-03 ENCOUNTER — Other Ambulatory Visit: Payer: Self-pay

## 2020-05-03 ENCOUNTER — Ambulatory Visit
Admission: RE | Admit: 2020-05-03 | Discharge: 2020-05-03 | Disposition: A | Discharge status: Home / Self Care | Payer: Medicare PPO | Source: Ambulatory Visit | Attending: Surgery | Admitting: Surgery

## 2020-05-03 DIAGNOSIS — R600 Localized edema: Secondary | ICD-10-CM | POA: Diagnosis present

## 2020-05-03 DIAGNOSIS — Z96651 Presence of right artificial knee joint: Secondary | ICD-10-CM

## 2020-05-03 DIAGNOSIS — Z01818 Encounter for other preprocedural examination: Secondary | ICD-10-CM | POA: Diagnosis not present

## 2020-05-03 DIAGNOSIS — M1711 Unilateral primary osteoarthritis, right knee: Secondary | ICD-10-CM | POA: Insufficient documentation

## 2020-05-03 LAB — CBC WITH DIFFERENTIAL/PLATELET
Abs Immature Granulocytes: 0.05 10*3/uL (ref 0.00–0.07)
Basophils Absolute: 0.1 10*3/uL (ref 0.0–0.1)
Basophils Relative: 1 %
Eosinophils Absolute: 0.4 10*3/uL (ref 0.0–0.5)
Eosinophils Relative: 4 %
HCT: 39.7 % (ref 36.0–46.0)
Hemoglobin: 13.1 g/dL (ref 12.0–15.0)
Immature Granulocytes: 1 %
Lymphocytes Relative: 29 %
Lymphs Abs: 2.5 10*3/uL (ref 0.7–4.0)
MCH: 29.9 pg (ref 26.0–34.0)
MCHC: 33 g/dL (ref 30.0–36.0)
MCV: 90.6 fL (ref 80.0–100.0)
Monocytes Absolute: 0.7 10*3/uL (ref 0.1–1.0)
Monocytes Relative: 8 %
Neutro Abs: 5.1 10*3/uL (ref 1.7–7.7)
Neutrophils Relative %: 57 %
Platelets: 246 10*3/uL (ref 150–400)
RBC: 4.38 MIL/uL (ref 3.87–5.11)
RDW: 14.8 % (ref 11.5–15.5)
WBC: 8.8 10*3/uL (ref 4.0–10.5)
nRBC: 0 % (ref 0.0–0.2)

## 2020-05-03 LAB — URINALYSIS, ROUTINE W REFLEX MICROSCOPIC
Bilirubin Urine: NEGATIVE
Glucose, UA: NEGATIVE mg/dL
Hgb urine dipstick: NEGATIVE
Ketones, ur: NEGATIVE mg/dL
Leukocytes,Ua: NEGATIVE
Nitrite: NEGATIVE
Protein, ur: NEGATIVE mg/dL
Specific Gravity, Urine: 1.017 (ref 1.005–1.030)
pH: 6 (ref 5.0–8.0)

## 2020-05-03 LAB — COMPREHENSIVE METABOLIC PANEL
ALT: 17 U/L (ref 0–44)
AST: 16 U/L (ref 15–41)
Albumin: 4.1 g/dL (ref 3.5–5.0)
Alkaline Phosphatase: 56 U/L (ref 38–126)
Anion gap: 9 (ref 5–15)
BUN: 23 mg/dL (ref 8–23)
CO2: 26 mmol/L (ref 22–32)
Calcium: 10.1 mg/dL (ref 8.9–10.3)
Chloride: 102 mmol/L (ref 98–111)
Creatinine, Ser: 1.49 mg/dL — ABNORMAL HIGH (ref 0.44–1.00)
GFR calc Af Amer: 40 mL/min — ABNORMAL LOW (ref >60)
GFR calc non Af Amer: 35 mL/min — ABNORMAL LOW (ref >60)
Glucose, Bld: 98 mg/dL (ref 70–99)
Potassium: 3.6 mmol/L (ref 3.5–5.1)
Sodium: 137 mmol/L (ref 135–145)
Total Bilirubin: 0.7 mg/dL (ref 0.3–1.2)
Total Protein: 7 g/dL (ref 6.5–8.1)

## 2020-05-03 LAB — SURGICAL PCR SCREEN
MRSA, PCR: NEGATIVE
Staphylococcus aureus: NEGATIVE

## 2020-05-03 LAB — TYPE AND SCREEN
ABO/RH(D): A POS
Antibody Screen: NEGATIVE

## 2020-05-03 NOTE — Pre-Procedure Instructions (Signed)
EKG prelim = no significant change PAC/MD notified of  Abn CMP

## 2020-05-10 ENCOUNTER — Other Ambulatory Visit: Payer: Self-pay

## 2020-05-10 ENCOUNTER — Other Ambulatory Visit
Admission: RE | Admit: 2020-05-10 | Discharge: 2020-05-10 | Disposition: A | Discharge status: Home / Self Care | Payer: Medicare PPO | Source: Ambulatory Visit | Attending: Surgery | Admitting: Surgery

## 2020-05-10 DIAGNOSIS — Z20822 Contact with and (suspected) exposure to covid-19: Secondary | ICD-10-CM | POA: Insufficient documentation

## 2020-05-10 DIAGNOSIS — Z01812 Encounter for preprocedural laboratory examination: Secondary | ICD-10-CM | POA: Diagnosis present

## 2020-05-10 LAB — SARS CORONAVIRUS 2 (TAT 6-24 HRS): SARS Coronavirus 2: NEGATIVE

## 2020-05-14 ENCOUNTER — Encounter
Admission: RE | Disposition: A | Discharge status: Home Health | Payer: Self-pay | Source: Home / Self Care | Attending: Surgery

## 2020-05-14 ENCOUNTER — Inpatient Hospital Stay: Payer: Medicare PPO | Admitting: Certified Registered Nurse Anesthetist

## 2020-05-14 ENCOUNTER — Inpatient Hospital Stay: Payer: Medicare PPO

## 2020-05-14 ENCOUNTER — Other Ambulatory Visit: Payer: Self-pay

## 2020-05-14 ENCOUNTER — Encounter: Payer: Self-pay | Admitting: Surgery

## 2020-05-14 ENCOUNTER — Inpatient Hospital Stay
Admission: RE | Admit: 2020-05-14 | Discharge: 2020-05-15 | DRG: 470 | Disposition: A | Discharge status: Home Health | Payer: Medicare PPO | Attending: Surgery | Admitting: Surgery

## 2020-05-14 DIAGNOSIS — I10 Essential (primary) hypertension: Secondary | ICD-10-CM | POA: Diagnosis present

## 2020-05-14 DIAGNOSIS — F329 Major depressive disorder, single episode, unspecified: Secondary | ICD-10-CM | POA: Diagnosis present

## 2020-05-14 DIAGNOSIS — Z96652 Presence of left artificial knee joint: Secondary | ICD-10-CM

## 2020-05-14 DIAGNOSIS — M1712 Unilateral primary osteoarthritis, left knee: Secondary | ICD-10-CM | POA: Diagnosis present

## 2020-05-14 DIAGNOSIS — D649 Anemia, unspecified: Secondary | ICD-10-CM | POA: Diagnosis present

## 2020-05-14 DIAGNOSIS — Z8249 Family history of ischemic heart disease and other diseases of the circulatory system: Secondary | ICD-10-CM

## 2020-05-14 DIAGNOSIS — J45909 Unspecified asthma, uncomplicated: Secondary | ICD-10-CM | POA: Diagnosis present

## 2020-05-14 DIAGNOSIS — K219 Gastro-esophageal reflux disease without esophagitis: Secondary | ICD-10-CM | POA: Diagnosis present

## 2020-05-14 DIAGNOSIS — Z79899 Other long term (current) drug therapy: Secondary | ICD-10-CM | POA: Diagnosis not present

## 2020-05-14 DIAGNOSIS — Z20822 Contact with and (suspected) exposure to covid-19: Secondary | ICD-10-CM | POA: Diagnosis present

## 2020-05-14 HISTORY — PX: TOTAL KNEE ARTHROPLASTY: SHX125

## 2020-05-14 SURGERY — ARTHROPLASTY, KNEE, TOTAL
Anesthesia: General | Site: Knee | Laterality: Left

## 2020-05-14 MED ORDER — KETOROLAC TROMETHAMINE 15 MG/ML IJ SOLN
INTRAMUSCULAR | Status: AC
  Administered 2020-05-14: 15 mg via INTRAVENOUS
  Filled 2020-05-14: qty 1

## 2020-05-14 MED ORDER — ACETAMINOPHEN 500 MG PO TABS
ORAL_TABLET | ORAL | Status: AC
  Filled 2020-05-14: qty 1

## 2020-05-14 MED ORDER — ONDANSETRON HCL 4 MG/2ML IJ SOLN
INTRAMUSCULAR | Status: DC | PRN
  Administered 2020-05-14 (×2): 4 mg via INTRAVENOUS

## 2020-05-14 MED ORDER — MAGNESIUM OXIDE 400 (241.3 MG) MG PO TABS
200.0000 mg | ORAL_TABLET | Freq: Every day | ORAL | Status: DC
  Administered 2020-05-14 – 2020-05-15 (×2): 200 mg via ORAL
  Filled 2020-05-14 (×2): qty 1

## 2020-05-14 MED ORDER — ZINC SULFATE 220 (50 ZN) MG PO CAPS
220.0000 mg | ORAL_CAPSULE | Freq: Every day | ORAL | Status: DC
  Administered 2020-05-14 – 2020-05-15 (×2): 220 mg via ORAL
  Filled 2020-05-14 (×2): qty 1

## 2020-05-14 MED ORDER — TRANEXAMIC ACID 1000 MG/10ML IV SOLN
INTRAVENOUS | Status: AC
  Filled 2020-05-14: qty 10

## 2020-05-14 MED ORDER — BUPIVACAINE LIPOSOME 1.3 % IJ SUSP
INTRAMUSCULAR | Status: AC
  Filled 2020-05-14: qty 20

## 2020-05-14 MED ORDER — TRANEXAMIC ACID 1000 MG/10ML IV SOLN
INTRAVENOUS | Status: DC | PRN
  Administered 2020-05-14: 1000 mg via TOPICAL

## 2020-05-14 MED ORDER — KETOROLAC TROMETHAMINE 15 MG/ML IJ SOLN
7.5000 mg | Freq: Four times a day (QID) | INTRAMUSCULAR | Status: AC
  Administered 2020-05-14 – 2020-05-15 (×2): 7.5 mg via INTRAVENOUS
  Filled 2020-05-14 (×3): qty 1

## 2020-05-14 MED ORDER — CHLORHEXIDINE GLUCONATE 0.12 % MT SOLN
OROMUCOSAL | Status: AC
  Administered 2020-05-14: 15 mL via OROMUCOSAL
  Filled 2020-05-14: qty 15

## 2020-05-14 MED ORDER — PROPOFOL 10 MG/ML IV BOLUS
INTRAVENOUS | Status: AC
  Filled 2020-05-14: qty 20

## 2020-05-14 MED ORDER — VITAMIN D 25 MCG (1000 UNIT) PO TABS
1000.0000 [IU] | ORAL_TABLET | Freq: Every day | ORAL | Status: DC
  Administered 2020-05-15: 1000 [IU] via ORAL
  Filled 2020-05-14 (×2): qty 1

## 2020-05-14 MED ORDER — CALCIUM-MAGNESIUM-ZINC 333.33-133.33-5 MG PO TABS: 1.0000 | ORAL_TABLET | Freq: Every day | ORAL | Status: DC

## 2020-05-14 MED ORDER — HYDROCHLOROTHIAZIDE 25 MG PO TABS
25.0000 mg | ORAL_TABLET | ORAL | Status: DC
  Administered 2020-05-14 – 2020-05-15 (×2): 25 mg via ORAL
  Filled 2020-05-14 (×2): qty 1

## 2020-05-14 MED ORDER — ENOXAPARIN SODIUM 40 MG/0.4ML ~~LOC~~ SOLN
40.0000 mg | SUBCUTANEOUS | Status: DC
  Administered 2020-05-15: 40 mg via SUBCUTANEOUS
  Filled 2020-05-14: qty 0.4

## 2020-05-14 MED ORDER — FLUTICASONE PROPIONATE 50 MCG/ACT NA SUSP
2.0000 | Freq: Every day | NASAL | Status: DC | PRN
  Filled 2020-05-14: qty 16

## 2020-05-14 MED ORDER — SUGAMMADEX SODIUM 500 MG/5ML IV SOLN
INTRAVENOUS | Status: DC | PRN
Start: 2020-05-14 — End: 2020-05-14
  Administered 2020-05-14: 200 mg via INTRAVENOUS

## 2020-05-14 MED ORDER — BISACODYL 10 MG RE SUPP: 10.0000 mg | Freq: Every day | RECTAL | Status: DC | PRN

## 2020-05-14 MED ORDER — ACETAMINOPHEN 10 MG/ML IV SOLN
INTRAVENOUS | Status: AC
  Filled 2020-05-14: qty 100

## 2020-05-14 MED ORDER — ONDANSETRON HCL 4 MG PO TABS: 4.0000 mg | ORAL_TABLET | Freq: Four times a day (QID) | ORAL | Status: DC | PRN

## 2020-05-14 MED ORDER — CALCIUM CARBONATE ANTACID 500 MG PO CHEW
1.5000 | CHEWABLE_TABLET | Freq: Every day | ORAL | Status: DC
  Administered 2020-05-14 – 2020-05-15 (×2): 300 mg via ORAL
  Filled 2020-05-14 (×2): qty 2

## 2020-05-14 MED ORDER — FAMOTIDINE 20 MG PO TABS: 20.0000 mg | ORAL_TABLET | Freq: Once | ORAL | Status: AC

## 2020-05-14 MED ORDER — ESCITALOPRAM OXALATE 10 MG PO TABS
20.0000 mg | ORAL_TABLET | ORAL | Status: DC
  Administered 2020-05-15: 20 mg via ORAL
  Filled 2020-05-14: qty 2

## 2020-05-14 MED ORDER — BUPIVACAINE-EPINEPHRINE (PF) 0.5% -1:200000 IJ SOLN
INTRAMUSCULAR | Status: DC | PRN
  Administered 2020-05-14: 30 mL

## 2020-05-14 MED ORDER — FLEET ENEMA 7-19 GM/118ML RE ENEM: 1.0000 | ENEMA | Freq: Once | RECTAL | Status: DC | PRN

## 2020-05-14 MED ORDER — HYDROCODONE-ACETAMINOPHEN 7.5-325 MG PO TABS
1.0000 | ORAL_TABLET | ORAL | Status: DC | PRN
  Filled 2020-05-14: qty 2

## 2020-05-14 MED ORDER — MAGNESIUM HYDROXIDE 400 MG/5ML PO SUSP
30.0000 mL | Freq: Every day | ORAL | Status: DC | PRN
  Administered 2020-05-15: 30 mL via ORAL
  Filled 2020-05-14: qty 30

## 2020-05-14 MED ORDER — LACTATED RINGERS IV SOLN: INTRAVENOUS | Status: DC

## 2020-05-14 MED ORDER — CYCLOBENZAPRINE HCL 10 MG PO TABS
5.0000 mg | ORAL_TABLET | Freq: Three times a day (TID) | ORAL | Status: DC | PRN
  Administered 2020-05-14 – 2020-05-15 (×2): 10 mg via ORAL
  Filled 2020-05-14 (×2): qty 1

## 2020-05-14 MED ORDER — SODIUM CHLORIDE 0.9 % IV SOLN: INTRAVENOUS | Status: DC

## 2020-05-14 MED ORDER — PROPOFOL 10 MG/ML IV BOLUS
INTRAVENOUS | Status: DC | PRN
  Administered 2020-05-14: 150 mg via INTRAVENOUS

## 2020-05-14 MED ORDER — MORPHINE SULFATE (PF) 2 MG/ML IV SOLN: 1.0000 mg | INTRAVENOUS | Status: DC | PRN

## 2020-05-14 MED ORDER — ACETAMINOPHEN 325 MG PO TABS
325.0000 mg | ORAL_TABLET | Freq: Four times a day (QID) | ORAL | Status: DC | PRN
  Administered 2020-05-15: 650 mg via ORAL
  Filled 2020-05-14: qty 2

## 2020-05-14 MED ORDER — CHLORHEXIDINE GLUCONATE 0.12 % MT SOLN: 15.0000 mL | Freq: Once | OROMUCOSAL | Status: AC

## 2020-05-14 MED ORDER — DIPHENHYDRAMINE HCL 12.5 MG/5ML PO ELIX: 12.5000 mg | ORAL_SOLUTION | ORAL | Status: DC | PRN

## 2020-05-14 MED ORDER — POLYETHYLENE GLYCOL 3350 17 G PO PACK
17.0000 g | PACK | Freq: Every day | ORAL | Status: DC
  Administered 2020-05-14 – 2020-05-15 (×2): 17 g via ORAL
  Filled 2020-05-14 (×2): qty 1

## 2020-05-14 MED ORDER — ACETAMINOPHEN 500 MG PO TABS
500.0000 mg | ORAL_TABLET | Freq: Four times a day (QID) | ORAL | Status: AC
  Administered 2020-05-14 – 2020-05-15 (×4): 500 mg via ORAL
  Filled 2020-05-14 (×3): qty 1

## 2020-05-14 MED ORDER — FENTANYL CITRATE (PF) 100 MCG/2ML IJ SOLN
INTRAMUSCULAR | Status: AC
  Administered 2020-05-14: 25 ug via INTRAVENOUS
  Filled 2020-05-14: qty 2

## 2020-05-14 MED ORDER — BUPIVACAINE HCL (PF) 0.5 % IJ SOLN
INTRAMUSCULAR | Status: AC
  Filled 2020-05-14: qty 30

## 2020-05-14 MED ORDER — ALBUTEROL SULFATE (2.5 MG/3ML) 0.083% IN NEBU: 2.5000 mg | INHALATION_SOLUTION | RESPIRATORY_TRACT | Status: DC | PRN

## 2020-05-14 MED ORDER — ONDANSETRON HCL 4 MG/2ML IJ SOLN: 4.0000 mg | Freq: Once | INTRAMUSCULAR | Status: DC | PRN

## 2020-05-14 MED ORDER — CEFAZOLIN SODIUM-DEXTROSE 2-4 GM/100ML-% IV SOLN
2.0000 g | Freq: Four times a day (QID) | INTRAVENOUS | Status: AC
  Administered 2020-05-14 – 2020-05-15 (×2): 2 g via INTRAVENOUS
  Filled 2020-05-14 (×2): qty 100

## 2020-05-14 MED ORDER — METOCLOPRAMIDE HCL 10 MG PO TABS: 5.0000 mg | ORAL_TABLET | Freq: Three times a day (TID) | ORAL | Status: DC | PRN

## 2020-05-14 MED ORDER — GABAPENTIN 300 MG PO CAPS
300.0000 mg | ORAL_CAPSULE | Freq: Every day | ORAL | Status: DC
  Filled 2020-05-14: qty 1

## 2020-05-14 MED ORDER — PROPOFOL 500 MG/50ML IV EMUL
INTRAVENOUS | Status: DC | PRN
Start: 2020-05-14 — End: 2020-05-14
  Administered 2020-05-14: 25 ug/kg/min via INTRAVENOUS

## 2020-05-14 MED ORDER — CEFAZOLIN SODIUM-DEXTROSE 2-4 GM/100ML-% IV SOLN
2.0000 g | INTRAVENOUS | Status: AC
  Administered 2020-05-14: 2 g via INTRAVENOUS

## 2020-05-14 MED ORDER — MIDAZOLAM HCL 2 MG/2ML IJ SOLN
INTRAMUSCULAR | Status: DC | PRN
  Administered 2020-05-14: 2 mg via INTRAVENOUS

## 2020-05-14 MED ORDER — FENTANYL CITRATE (PF) 100 MCG/2ML IJ SOLN
INTRAMUSCULAR | Status: AC
  Filled 2020-05-14: qty 2

## 2020-05-14 MED ORDER — FAMOTIDINE 20 MG PO TABS
ORAL_TABLET | ORAL | Status: AC
  Administered 2020-05-14: 20 mg via ORAL
  Filled 2020-05-14: qty 1

## 2020-05-14 MED ORDER — CEFAZOLIN SODIUM-DEXTROSE 2-4 GM/100ML-% IV SOLN
INTRAVENOUS | Status: AC
  Filled 2020-05-14: qty 100

## 2020-05-14 MED ORDER — BLACK ELDERBERRY 50 MG/5ML PO SYRP: ORAL_SOLUTION | Freq: Every day | ORAL | Status: DC

## 2020-05-14 MED ORDER — ORAL CARE MOUTH RINSE: 15.0000 mL | Freq: Once | OROMUCOSAL | Status: AC

## 2020-05-14 MED ORDER — FENTANYL CITRATE (PF) 100 MCG/2ML IJ SOLN
25.0000 ug | INTRAMUSCULAR | Status: AC | PRN
  Administered 2020-05-14 (×6): 25 ug via INTRAVENOUS

## 2020-05-14 MED ORDER — MIDAZOLAM HCL 2 MG/2ML IJ SOLN
INTRAMUSCULAR | Status: AC
  Filled 2020-05-14: qty 2

## 2020-05-14 MED ORDER — KETOROLAC TROMETHAMINE 15 MG/ML IJ SOLN: 15.0000 mg | Freq: Once | INTRAMUSCULAR | Status: AC

## 2020-05-14 MED ORDER — SODIUM CHLORIDE (PF) 0.9 % IJ SOLN
INTRAMUSCULAR | Status: AC
  Filled 2020-05-14: qty 50

## 2020-05-14 MED ORDER — ACETAMINOPHEN 10 MG/ML IV SOLN
INTRAVENOUS | Status: DC | PRN
  Administered 2020-05-14: 1000 mg via INTRAVENOUS

## 2020-05-14 MED ORDER — ROCURONIUM BROMIDE 100 MG/10ML IV SOLN
INTRAVENOUS | Status: DC | PRN
  Administered 2020-05-14: 50 mg via INTRAVENOUS
  Administered 2020-05-14: 20 mg via INTRAVENOUS

## 2020-05-14 MED ORDER — METOCLOPRAMIDE HCL 5 MG/ML IJ SOLN: 5.0000 mg | Freq: Three times a day (TID) | INTRAMUSCULAR | Status: DC | PRN

## 2020-05-14 MED ORDER — PHENYLEPHRINE HCL (PRESSORS) 10 MG/ML IV SOLN
INTRAVENOUS | Status: DC | PRN
  Administered 2020-05-14 (×3): 100 ug via INTRAVENOUS

## 2020-05-14 MED ORDER — LORATADINE 10 MG PO TABS
10.0000 mg | ORAL_TABLET | Freq: Every day | ORAL | Status: DC
  Administered 2020-05-15: 10 mg via ORAL
  Filled 2020-05-14: qty 1

## 2020-05-14 MED ORDER — TRAMADOL HCL 50 MG PO TABS
ORAL_TABLET | ORAL | Status: AC
  Administered 2020-05-14: 50 mg via ORAL
  Filled 2020-05-14: qty 1

## 2020-05-14 MED ORDER — DEXAMETHASONE SODIUM PHOSPHATE 10 MG/ML IJ SOLN
INTRAMUSCULAR | Status: DC | PRN
  Administered 2020-05-14: 10 mg via INTRAVENOUS

## 2020-05-14 MED ORDER — ONDANSETRON HCL 4 MG/2ML IJ SOLN: 4.0000 mg | Freq: Four times a day (QID) | INTRAMUSCULAR | Status: DC | PRN

## 2020-05-14 MED ORDER — TRAMADOL HCL 50 MG PO TABS
50.0000 mg | ORAL_TABLET | Freq: Four times a day (QID) | ORAL | Status: DC | PRN
  Administered 2020-05-15: 50 mg via ORAL
  Filled 2020-05-14: qty 1

## 2020-05-14 MED ORDER — EPINEPHRINE PF 1 MG/ML IJ SOLN
INTRAMUSCULAR | Status: AC
  Filled 2020-05-14: qty 1

## 2020-05-14 MED ORDER — LIDOCAINE HCL (CARDIAC) PF 100 MG/5ML IV SOSY
PREFILLED_SYRINGE | INTRAVENOUS | Status: DC | PRN
  Administered 2020-05-14: 100 mg via INTRAVENOUS

## 2020-05-14 MED ORDER — VITAMIN B-12 1000 MCG PO TABS
1000.0000 ug | ORAL_TABLET | Freq: Every day | ORAL | Status: DC
  Administered 2020-05-14 – 2020-05-15 (×2): 1000 ug via ORAL
  Filled 2020-05-14 (×2): qty 1

## 2020-05-14 MED ORDER — DOCUSATE SODIUM 100 MG PO CAPS
100.0000 mg | ORAL_CAPSULE | Freq: Two times a day (BID) | ORAL | Status: DC
  Administered 2020-05-14 – 2020-05-15 (×2): 100 mg via ORAL
  Filled 2020-05-14 (×3): qty 1

## 2020-05-14 MED ORDER — PROPOFOL 10 MG/ML IV BOLUS
INTRAVENOUS | Status: AC
  Filled 2020-05-14: qty 40

## 2020-05-14 MED ORDER — SODIUM CHLORIDE 0.9 % IV SOLN
INTRAVENOUS | Status: DC | PRN
  Administered 2020-05-14: 60 mL

## 2020-05-14 MED ORDER — FENTANYL CITRATE (PF) 100 MCG/2ML IJ SOLN
INTRAMUSCULAR | Status: DC | PRN
  Administered 2020-05-14: 100 ug via INTRAVENOUS

## 2020-05-14 MED ORDER — LOSARTAN POTASSIUM 25 MG PO TABS
25.0000 mg | ORAL_TABLET | ORAL | Status: DC
  Administered 2020-05-14 – 2020-05-15 (×2): 25 mg via ORAL
  Filled 2020-05-14 (×2): qty 1

## 2020-05-14 SURGICAL SUPPLY — 60 items
BLADE SAW SAG 25X90X1.19 (BLADE) ×3 IMPLANT
BLADE SURG SZ20 CARB STEEL (BLADE) ×3 IMPLANT
BNDG ELASTIC 6X5.8 VLCR NS LF (GAUZE/BANDAGES/DRESSINGS) ×3 IMPLANT
CANISTER SUCT 1200ML W/VALVE (MISCELLANEOUS) ×3 IMPLANT
CANISTER SUCT 3000ML PPV (MISCELLANEOUS) ×3 IMPLANT
CEMENT BONE R 1X40 (Cement) ×6 IMPLANT
CEMENT VACUUM MIXING SYSTEM (MISCELLANEOUS) ×3 IMPLANT
CHLORAPREP W/TINT 26 (MISCELLANEOUS) ×3 IMPLANT
COOLER POLAR GLACIER W/PUMP (MISCELLANEOUS) ×3 IMPLANT
COVER MAYO STAND REUSABLE (DRAPES) ×3 IMPLANT
COVER WAND RF STERILE (DRAPES) ×3 IMPLANT
CUFF TOURN SGL QUICK 24 (TOURNIQUET CUFF)
CUFF TOURN SGL QUICK 30 (TOURNIQUET CUFF) ×2
CUFF TRNQT CYL 24X4X16.5-23 (TOURNIQUET CUFF) IMPLANT
CUFF TRNQT CYL 30X4X21-28X (TOURNIQUET CUFF) ×1 IMPLANT
DRAPE 3/4 80X56 (DRAPES) ×3 IMPLANT
DRAPE IMP U-DRAPE 54X76 (DRAPES) ×3 IMPLANT
DRSG OPSITE POSTOP 4X10 (GAUZE/BANDAGES/DRESSINGS) ×3 IMPLANT
DRSG OPSITE POSTOP 4X8 (GAUZE/BANDAGES/DRESSINGS) ×3 IMPLANT
ELECT CAUTERY BLADE 6.4 (BLADE) ×3 IMPLANT
ELECT REM PT RETURN 9FT ADLT (ELECTROSURGICAL) ×3
ELECTRODE REM PT RTRN 9FT ADLT (ELECTROSURGICAL) ×1 IMPLANT
FEMORAL CR LEFT  70MM (Joint) ×2 IMPLANT
FEMORAL CR LEFT 70MM (Joint) ×1 IMPLANT
GLOVE BIO SURGEON STRL SZ7.5 (GLOVE) ×12 IMPLANT
GLOVE BIO SURGEON STRL SZ8 (GLOVE) ×12 IMPLANT
GLOVE BIOGEL PI IND STRL 8 (GLOVE) ×1 IMPLANT
GLOVE BIOGEL PI INDICATOR 8 (GLOVE) ×2
GLOVE INDICATOR 8.0 STRL GRN (GLOVE) ×3 IMPLANT
GOWN STRL REUS W/ TWL LRG LVL3 (GOWN DISPOSABLE) ×1 IMPLANT
GOWN STRL REUS W/ TWL XL LVL3 (GOWN DISPOSABLE) ×1 IMPLANT
GOWN STRL REUS W/TWL LRG LVL3 (GOWN DISPOSABLE) ×2
GOWN STRL REUS W/TWL XL LVL3 (GOWN DISPOSABLE) ×2
HOLDER FOLEY CATH W/STRAP (MISCELLANEOUS) IMPLANT
HOOD PEEL AWAY FLYTE STAYCOOL (MISCELLANEOUS) ×12 IMPLANT
INSERT TIB BEARING 75X12 (Insert) ×3 IMPLANT
KIT TURNOVER KIT A (KITS) ×3 IMPLANT
NDL SAFETY ECLIPSE 18X1.5 (NEEDLE) ×2 IMPLANT
NEEDLE HYPO 18GX1.5 SHARP (NEEDLE) ×4
NEEDLE SPNL 20GX3.5 QUINCKE YW (NEEDLE) ×3 IMPLANT
NS IRRIG 1000ML POUR BTL (IV SOLUTION) ×3 IMPLANT
PACK TOTAL KNEE (MISCELLANEOUS) ×3 IMPLANT
PAD WRAPON POLAR KNEE (MISCELLANEOUS) ×1 IMPLANT
PATELLA STD 34X8.5 (Orthopedic Implant) ×3 IMPLANT
PLATE KNEE TIBIAL 75MM FIXED (Plate) ×3 IMPLANT
PULSAVAC PLUS IRRIG FAN TIP (DISPOSABLE) ×3
SOL .9 NS 3000ML IRR  AL (IV SOLUTION) ×2
SOL .9 NS 3000ML IRR UROMATIC (IV SOLUTION) ×1 IMPLANT
STAPLER SKIN PROX 35W (STAPLE) ×3 IMPLANT
SUCTION FRAZIER HANDLE 10FR (MISCELLANEOUS) ×2
SUCTION TUBE FRAZIER 10FR DISP (MISCELLANEOUS) ×1 IMPLANT
SUT VIC AB 0 CT1 36 (SUTURE) ×9 IMPLANT
SUT VIC AB 2-0 CT1 27 (SUTURE) ×6
SUT VIC AB 2-0 CT1 TAPERPNT 27 (SUTURE) ×3 IMPLANT
SYR 10ML LL (SYRINGE) ×3 IMPLANT
SYR 20ML LL LF (SYRINGE) ×3 IMPLANT
SYR 30ML LL (SYRINGE) ×9 IMPLANT
TIP FAN IRRIG PULSAVAC PLUS (DISPOSABLE) ×1 IMPLANT
TRAY FOLEY MTR SLVR 16FR STAT (SET/KITS/TRAYS/PACK) IMPLANT
WRAPON POLAR PAD KNEE (MISCELLANEOUS) ×3

## 2020-05-14 NOTE — Progress Notes (Signed)
PHARMACIST - PHYSICIAN ORDER COMMUNICATION  CONCERNING: P&T Medication Policy on Herbal Medications  DESCRIPTION:  This patients order for:  Black Elderberry syrup  has been noted.  This product(s) is classified as an herbal or natural product. Due to a lack of definitive safety studies or FDA approval, nonstandard manufacturing practices, plus the potential risk of unknown drug-drug interactions while on inpatient medications, the Pharmacy and Therapeutics Committee does not permit the use of herbal or natural products of this type within [facility].   ACTION TAKEN: The pharmacy department is unable to verify this order at this time and your patient has been informed of this safety policy. Please reevaluate patients clinical condition at discharge and address if the herbal or natural product(s) should be resumed at that time.

## 2020-05-14 NOTE — TOC Initial Note (Signed)
Transition of Care [facility]) - Initial/Assessment Note    Patient Details  Name: Brandi Newton MRN: [id number] Date of Birth: 1946/10/01  Transition of [facility]) CM/SW Contact:    Elease Hashimoto, LCSW Phone Number: 05/14/2020, 4:13 PM  Clinical Narrative:  Met with pt to discuss discharge plans. Pt has been through this before last year had her other knee replaced. She knows what to do and has all equipment from last year. She had [facility] last year and wants the same therapists as she had before. Will make sure ask [facility] regarding this. Pt was independent prior to admission and husband can assist if needed at discharge. Pt has no other issues, she drives or husband can and she has a PCP. Check on her tomorrow and see if any further needs.                 Expected Discharge Plan: [place] Barriers to Discharge: Continued Medical Work up   Patient Goals and CMS Choice Patient states their goals for this hospitalization and ongoing recovery are:: I plan to go home and heal I did this last year, so now I'm done CMS Medicare.gov Compare Post Acute Care list provided to:: Patient Choice offered to / list presented to : Patient  Expected Discharge Plan and Services Expected Discharge Plan: [place] In-house Referral: Clinical Social Work   Post Acute Care Choice: [place] arrangements for the past 2 months: [place]: PT [place]: [facility] (now [facility]) Date [place]: 05/14/20 Time [place]: 1612 Representative spoke with at [place]: [place] Arrangements/Services Living arrangements for the past 2 months: [place] Lives with:: Spouse Patient language and need for interpreter reviewed:: No Do you feel safe going back to the place where you live?: Yes      Need for Family Participation in Patient Care: Yes  (Comment) Care giver support system in place?: Yes (comment) Current home services: DME(has rw, bsc and shower chair) Criminal Activity/Legal Involvement Pertinent to Current Situation/Hospitalization: No - Comment as needed  Activities of Daily Living Home Assistive Devices/Equipment: Cane (specify quad or straight) ADL Screening (condition at time of admission) Patient's cognitive ability adequate to safely complete daily activities?: Yes Is the patient deaf or have difficulty hearing?: No Does the patient have difficulty seeing, even when wearing glasses/contacts?: Yes Does the patient have difficulty concentrating, remembering, or making decisions?: No Patient able to express need for assistance with ADLs?: Yes Does the patient have difficulty dressing or bathing?: No Independently performs ADLs?: Yes (appropriate for developmental age) Does the patient have difficulty walking or climbing stairs?: Yes Weakness of Legs: Both Weakness of Arms/Hands: None  Permission Sought/Granted Permission sought to share information with : Facility Sport and exercise psychologist, Family Supports Permission granted to share information with : Yes, Verbal Permission Granted  Share Information with NAME: Rush Landmark  Permission granted to share info w AGENCY: [facility]  Permission granted to share info w Relationship: husband  Permission granted to share info w Contact Information: teresa  Emotional Assessment Appearance:: Appears stated age Attitude/Demeanor/Rapport: Gracious, Charismatic Affect (typically observed): Adaptable, Accepting Orientation: : Oriented to Self, Oriented to Place, Oriented to  Time, Oriented to Situation Alcohol / Substance Use: Never Used Psych Involvement:  No (comment)  Admission diagnosis:  Status post total knee replacement using cement, left [Z96.652] Patient Active Problem List   Diagnosis Date Noted   Status post total knee replacement using cement, left 05/14/2020   Status  post total knee replacement using cement, right 04/27/2019   Cervical radiculopathy 05/11/2018   Lumbar radiculopathy 01/26/2018   Bilateral carpal tunnel syndrome 01/24/2018   S/P total hysterectomy and bilateral salpingo-oophorectomy 10/03/2015   Primary osteoarthritis of both knees 01/24/2015   Cough 02/14/2013   Allergic rhinitis 02/14/2013   GERD (gastroesophageal reflux disease) 02/14/2013   Singer's nodule 02/14/2013   PCP:  Ranae Plumber, [place] [facility]:   [place], [place] - [place] [address] [place] [address] Phone: [phone] Fax: [place] [address] ([address]), [place] - [place]) [place] [address] Phone: [phone] Fax: [phone]     Social Determinants of Health (SDOH) Interventions    Readmission Risk Interventions Readmission Risk Prevention Plan 04/29/2019  Post Dischage Appt Complete  Medication Screening Complete  Transportation Screening Complete  Some recent data might be hidden

## 2020-05-14 NOTE — Anesthesia Procedure Notes (Signed)
Procedure Name: Intubation Date/Time: 05/14/2020 7:40 AM Performed by: Louann Sjogren, CRNA Pre-anesthesia Checklist: Patient identified, Patient being monitored, Timeout performed, Emergency Drugs available and Suction available Patient Re-evaluated:Patient Re-evaluated prior to induction Oxygen Delivery Method: Circle system utilized Preoxygenation: Pre-oxygenation with 100% oxygen Induction Type: IV induction Ventilation: Mask ventilation without difficulty Laryngoscope Size: McGraph and 4 Grade View: Grade I Tube type: Oral Tube size: 7.0 mm Number of attempts: 1 Airway Equipment and Method: Stylet Placement Confirmation: ETT inserted through vocal cords under direct vision,  positive ETCO2 and breath sounds checked- equal and bilateral Secured at: 21 cm Tube secured with: Tape Dental Injury: Teeth and Oropharynx as per pre-operative assessment

## 2020-05-14 NOTE — Evaluation (Signed)
Physical Therapy Evaluation Patient Details Name: Brandi Newton MRN: [id number] DOB: 1946-10-25 Today's Date: 05/14/2020   History of Present Illness  Pt is a 74 yo female diagnosed with DJD of the L knee and is s/p L TKA.  PMH includes: depression, DJD, HTN, C4-7 ACDF, R TKA, and anemia.    Clinical Impression  Pt pleasant and motivated to participate during the session.  Pt performed very well during the session especially considering POD#0 status. Pt did not require physical assistance with any functional tasks and was able to take multiple steps forwards, backwards, and side-stepping at the EOB.  Pt was steady in standing without LOB or buckling and reported no adverse symptoms other than mod L knee pain.  Pt is expected to make very good progress while in acute care and will benefit from HHPT services upon discharge to safely address deficits listed in patient problem list for decreased caregiver assistance and eventual return to PLOF.      Follow Up Recommendations Home health PT;Supervision for mobility/OOB    Equipment Recommendations  None recommended by PT    Recommendations for Other Services       Precautions / Restrictions Precautions Precautions: Fall;Knee Precaution Booklet Issued: Yes (comment) Restrictions Weight Bearing Restrictions: Yes LLE Weight Bearing: Weight bearing as tolerated      Mobility  Bed Mobility Overal bed mobility: Modified Independent             General bed mobility comments: Extra time and effort  Transfers Overall transfer level: Needs assistance Equipment used: Rolling walker (2 wheeled) Transfers: Sit to/from Stand Sit to Stand: Min guard;From elevated surface         General transfer comment: Min to mod verbal cues for sequencing  Ambulation/Gait Ambulation/Gait assistance: Min guard Gait Distance (Feet): 6 Feet Assistive device: Rolling walker (2 wheeled) Gait Pattern/deviations: Step-to pattern;Decreased stance  time - left;Antalgic Gait velocity: decreased   General Gait Details: Mod verbal cues for step-to sequencing with pt steady without LOB or buckling and with no adverse symptoms noted other than L knee pain  Stairs            Wheelchair Mobility    Modified Rankin (Stroke Patients Only)       Balance Overall balance assessment: Needs assistance   Sitting balance-Leahy Scale: Normal     Standing balance support: Bilateral upper extremity supported Standing balance-Leahy Scale: Good                               Pertinent Vitals/Pain Pain Assessment: 0-10 Pain Score: 6  Pain Location: L knee Pain Descriptors / Indicators: Sore;Aching Pain Intervention(s): Premedicated before session;Monitored during session    Home Living Family/patient expects to be discharged to:: Private residence Living Arrangements: Spouse/significant other Available Help at Discharge: Family;Available 24 hours/day Type of Home: House Home Access: Stairs to enter Entrance Stairs-Rails: None Entrance Stairs-Number of Steps: 1 Home Layout: Two level;Able to live on main level with bedroom/bathroom Home Equipment: Gilford Rile - 2 wheels;Cane - single point;Shower seat;Bedside commode      Prior Function Level of Independence: Independent with assistive device(s)         Comments: Ind amb without an AD in the home and with a SPC in the community, no fall history, Ind with ADLs     Hand Dominance        Extremity/Trunk Assessment   Upper Extremity Assessment Upper Extremity Assessment: Overall [facility]  for tasks assessed    Lower Extremity Assessment Lower Extremity Assessment: Generalized weakness;LLE deficits/detail LLE Deficits / Details: BLE ankle strength and AROM WFL, sensation to BLEs intact and equal left/right LLE: Unable to fully assess due to pain LLE Sensation: WNL       Communication   Communication: No difficulties  Cognition Arousal/Alertness:  Awake/alert Behavior During Therapy: WFL for tasks assessed/performed Overall Cognitive Status: Within Functional Limits for tasks assessed                                        General Comments      Exercises Total Joint Exercises Ankle Circles/Pumps: Strengthening;Both;10 reps Quad Sets: AROM;Strengthening;Left;5 reps;10 reps Hip ABduction/ADduction: AROM;Both;10 reps Long Arc Quad: AROM;Strengthening;Left;10 reps;15 reps Knee Flexion: AROM;Strengthening;Left;10 reps;15 reps Goniometric ROM: L knee AROM: 4-90 deg Marching in Standing: AROM;Strengthening;Both;10 reps Other Exercises Other Exercises: HEP education per handout Other Exercises: Positioning education to encourage L knee ext PROM and prevent heel skin breakdown   Assessment/Plan    PT Assessment Patient needs continued PT services  PT Problem List Decreased strength;Decreased range of motion;Decreased activity tolerance;Decreased balance;Decreased mobility;Pain       PT Treatment Interventions DME instruction;Gait training;Stair training;Functional mobility training;Therapeutic activities;Therapeutic exercise;Balance training;Patient/family education    PT Goals (Current goals can be found in the Care Plan section)  Acute Rehab PT Goals Patient Stated Goal: To walk without pain and return home PT Goal Formulation: With patient Time For Goal Achievement: 05/27/20 Potential to Achieve Goals: Good    Frequency BID   Barriers to discharge        Co-evaluation               AM-PAC PT "6 Clicks" Mobility  Outcome Measure Help needed turning from your back to your side while in a flat bed without using bedrails?: A Little Help needed moving from lying on your back to sitting on the side of a flat bed without using bedrails?: A Little Help needed moving to and from a bed to a chair (including a wheelchair)?: A Little Help needed standing up from a chair using your arms (e.g., wheelchair  or bedside chair)?: A Little Help needed to walk in hospital room?: A Little Help needed climbing 3-5 steps with a railing? : A Lot 6 Click Score: 17    End of Session Equipment Utilized During Treatment: Gait belt Activity Tolerance: Patient tolerated treatment well Patient left: in chair;with chair alarm set;with SCD's reapplied;with call bell/phone within reach;Other (comment)(polar care donned to L knee) Nurse Communication: Mobility status PT Visit Diagnosis: Pain;Muscle weakness (generalized) (M62.81);Other abnormalities of gait and mobility (R26.89) Pain - Right/Left: Left Pain - part of body: Knee    Time: 1420-1501 PT Time Calculation (min) (ACUTE ONLY): 41 min   Charges:   PT Evaluation $PT Eval Moderate Complexity: 1 Mod PT Treatments $Therapeutic Exercise: 8-22 mins $Therapeutic Activity: 8-22 mins        D. Royetta Asal PT, DPT 05/14/20, 3:38 PM

## 2020-05-14 NOTE — Progress Notes (Signed)
   05/14/20 0700  Clinical Encounter Type  Visited With Patient  Visit Type Initial  Referral From Chaplain  Consult/Referral To Chaplain  Chaplain stood outside patient's room hoping to go in and speak to her, but staff was preparing her for surgery. But as patient was being transported to OR, she told chaplain that she liked her face mask and chaplain said thank you. Chaplain told patient that she will check on her after surgery.

## 2020-05-14 NOTE — H&P (Signed)
History of Present Illness: Brandi Newton is a 74 y.o. female who presents today for her surgical history and physical for upcoming left total knee arthroplasty scheduled with Dr. Roland Rack for 05/14/2020. The patient denies any changes in her medical history. She denies any personal history of heart attack, stroke, asthma or COPD, and has no personal history of blood clots. The patient was evaluated last week due to increased swelling and bruising to the right lower extremity. She was sent for an ultrasound of the right leg which was negative for acute DVT, swelling and ecchymosis likely from a acute ruptured Baker's cyst at this time. She states that the pain and swelling the right lower extremity has significantly improved when compared to her most recent visit.  Past Medical History: . Depression  . DJD (degenerative joint disease)  . Hypertension   Past Surgical History: . C4-7 ACDF 05/11/2018  Dr Meade Maw at [facility]  . Cataract removed Bilateral  Left- 07/15/16 Right-09/02/16  . Cervical Spine Surgery 1985  . HYSTERECTOMY 10/02/2016  Dr. Vikki Ports Ward- Complete  . Nodules removed from vocal cords 1988  . Right Breast Biopsy Right 10/20/1974  benign  . Right TKA using all cemented biomet vanguard system with a 70 mm PCR femur, a 74mm tibial tray with a 16 mm E-poly insert, and a 34 x 8.5 mm all-poly 3-pegged domed patella Right 04/27/2019  Dr.Faylene Allerton  . TUBAL LIGATION 1984   Past Family History: . Heart failure Mother  . Heart failure Father  . Cancer Brother   Current Outpatient Medications: . acetaminophen (TYLENOL EXTRA STRENGTH) 500 MG tablet Take 500-1,000 mg by mouth every 6 (six) hours as needed for Pain  . albuterol 90 mcg/actuation inhaler Inhale 2 inhalations into the lungs every 4 (four) hours as needed  . calcium carbonate (CALCIUM 500 ORAL) Take 1,000 mg by mouth once daily  . cetirizine (ZYRTEC) 10 MG tablet Take 10 mg by mouth once daily  . cholecalciferol  (CHOLECALCIFEROL) 1,000 unit tablet Take 1,000 Units by mouth once daily  . cyanocobalamin (VITAMIN B-12) 1000 MCG tablet Take 5,000 mcg by mouth once daily.  Marland Kitchen escitalopram oxalate (LEXAPRO) 20 MG tablet Take 1 tablet by mouth once daily  . fluticasone (FLONASE) 50 mcg/actuation nasal spray 2 puffs each nostril daily as needed  . gabapentin (NEURONTIN) 300 MG capsule Take 1 capsule (300 mg total) by mouth every morning AND 1 capsule (300 mg total) daily with lunch AND 2 capsules (600 mg total) nightly. 360 capsule 11  . hydrochlorothiazide (HYDRODIURIL) 25 MG tablet Take 25 mg by mouth once daily.  Marland Kitchen losartan (COZAAR) 25 MG tablet Take 25 mg by mouth once daily  . magnesium oxide (MAG-OX) 400 mg (241.3 mg magnesium) tablet Take 400 mg by mouth once daily  . MELATONIN ORAL Take 1 tablet by mouth nightly as needed  . naproxen (NAPROSYN) 500 MG tablet Take by mouth Take 500 mg by mouth 2 (two) times daily as needed for mild pain  . polyethylene glycol (MIRALAX) packet Take 17 g by mouth once daily.  Marland Kitchen zinc sulfate (ZINC-15 ORAL) Take 1 tablet by mouth once daily   No current Epic-ordered facility-administered medications on file.   Allergies: . Ace Inhibitors Anaphylaxis and Cough  . Oxycodone-Acetaminophen Nausea And Vomiting   Review of Systems:  A comprehensive 14 point ROS was performed, reviewed by me today, and the pertinent orthopaedic findings are documented in the HPI.  Physical Exam: BP (!) 140/90 (BP Location: Left upper arm,  Patient Position: Sitting, BP Cuff Size: Adult)  Ht 165.1 cm (5\' 5" )  Wt 99.3 kg (219 lb)  BMI 36.44 kg/m  General/Constitutional: The patient appears to be well-nourished, well-developed, and in no acute distress. Neuro/Psych: Normal mood and affect, oriented to person, place and time. Eyes: Non-icteric. Pupils are equal, round, and reactive to light, and exhibit synchronous movement. ENT: Unremarkable. Lymphatic: No palpable adenopathy. Respiratory:  Lungs clear to auscultation, Normal chest excursion, No wheezes and Non-labored breathing Cardiovascular: Regular rate and rhythm. No murmurs. and No edema, swelling or tenderness, except as noted in detailed exam. Integumentary: No impressive skin lesions present, except as noted in detailed exam. Musculoskeletal: Unremarkable, except as noted in detailed exam.  Left knee exam: ALIGNMENT:Mild-moderate valgus SKIN:Unremarkable SWELLING:Mild EFFUSION:Small WARMTH:None TENDERNESS:Mild-moderatetenderness alongthe lateral joint line,and mildmedial joint line tenderness ROM:0-110degrees with mild pain in maximal flexion McMURRAY'S:Equivocally positive PATELLOFEMORAL:Normal tracking with no peri-patellar tenderness and negative apprehension sign CREPITUS:Minimal patellofemoral crepitance LACHMAN'S:Negative PIVOT SHIFT:Negative ANTERIOR DRAWER:Negative POSTERIOR DRAWER:Negative VARUS/VALGUS:Mildlypositive pseudolaxity to valgus stressing  She remainsneurovascular intact to theleft lower extremity and foot.  Skin examination of the right lower extremity does reveal continued ecchymosis to the right calf however swelling is much improved when compared to last week. The patient does not have any significant tenderness with palpation to the right calf at today's visit. She was able to dorsiflex and plantar flex the right foot without pain or weakness.  Imaging: AP weightbearing of both knees, as well as lateral and merchant views of the left knee are obtained. These films demonstrate severe degenerative changes, primarily involving the lateral compartment with 100% lateral joint space narrowing. Overall alignment is mild valgus. Lesser degenerative changes of the medial and patellofemoral compartments are observed. No fractures, lytic lesions, or abnormal calcifications are noted.  Impression: Primary osteoarthritis of left knee.  Plan:  1. Treatment options were  discussed today with the patient. 2. The patient is scheduled for a left total knee arthroplasty with Dr. Roland Rack on 05/14/2020. 3. The patient was instructed on the risk and benefits of surgery and wishes to proceed at this time. This document will serve as a surgical history of physical for the patient. 4. Patient was encouraged to continue to elevate the right lower extremity and continue to massage the right calf. 5. The patient will follow-up per standard postop protocol. They can call the clinic they have any questions, new symptoms develop or symptoms worsen.  The procedure was discussed with the patient, as were the potential risks (including bleeding, infection, nerve and/or blood vessel injury, persistent or recurrent pain, failure of the hardware, progression of arthritis, need for further surgery, blood clots, strokes, heart attacks and/or arhythmias, pneumonia, etc.) and benefits. The patient states her understanding and wishes to proceed.   H&P reviewed and patient re-examined. No changes.

## 2020-05-14 NOTE — Op Note (Addendum)
05/14/2020  9:42 AM  Patient:   Brandi Newton  Pre-Op Diagnosis:   Degenerative joint disease, left knee.  Post-Op Diagnosis:   Same  Procedure:   Left TKA using all-cemented Biomet Vanguard system with a 70 mm PCR femur, a 75 mm tibial tray with a 12 mm anterior stabilized E-poly insert, and a 34 x 8.5 mm all-poly 3-pegged domed patella.  Surgeon:   Pascal Lux, MD  Assistant:   Cameron Proud, PA-C; Kirkland Hun, PA-S  Anesthesia:   GET  Findings:   As above  Complications:   None  EBL:   5 cc  Fluids:   700 cc crystalloid  UOP:   None  TT:   80 minutes at 300 mmHg  Drains:   None  Closure:   Staples  Implants:   As above  Brief Clinical Note:   The patient is a 74 year old female with a long history of progressively worsening left knee pain. The patient's symptoms have progressed despite medications, activity modification, injections, etc. The patient's history and examination were consistent with advanced degenerative joint disease of the left knee confirmed by plain radiographs. The patient presents at this time for a left total knee arthroplasty.  Procedure:   The patient was brought into the operating room and lain in the supine position. After adequate general endotracheal intubation and anesthesia were obtained, the left lower extremity was prepped with ChloraPrep solution and draped sterilely. Preoperative antibiotics were administered. After verifying the proper laterality with a surgical timeout, the limb was exsanguinated with an Esmarch and the tourniquet inflated to 300 mmHg.   A standard anterior approach to the knee was made through an approximately 6-7 inch incision. The incision was carried down through the subcutaneous tissues to expose superficial retinaculum. This was split the length of the incision and the medial flap elevated sufficiently to expose the medial retinaculum. The medial retinaculum was incised, leaving a 3-4 mm cuff of tissue on the  patella. This was extended distally along the medial border of the patellar tendon and proximally through the medial third of the quadriceps tendon. A subtotal fat pad excision was performed before the soft tissues were elevated off the anteromedial and anterolateral aspects of the proximal tibia to the level of the collateral ligaments. The anterior portions of the medial and lateral menisci were removed, as was the anterior cruciate ligament. With the knee flexed to 90, the external tibial guide was positioned and the appropriate proximal tibial cut made. This piece was taken to the back table where it was measured and found to be optimally replicated by a 75 mm component.  Attention was directed to the distal femur. The intramedullary canal was accessed through a 3/8" drill hole. The intramedullary guide was inserted and positioned in order to obtain a neutral flexion gap. The intercondylar block was positioned with care taken to avoid notching the anterior cortex of the femur. The appropriate cut was made. Next, the distal cutting block was placed at 6 of valgus alignment. Using the 9 mm slot, the distal cut was made. The distal femur was measured and found to be optimally replicated by the 70 mm component. The 70 mm 4-in-1 cutting block was positioned and first the posterior, then the posterior chamfer, the anterior chamfer, and finally the anterior cuts were made. At this point, the posterior portions medial and lateral menisci were removed. A trial reduction was performed using the appropriate femoral and tibial components with first the 10 mm and  then the 12 mm insert. The 12 mm insert demonstrated excellent stability to varus and valgus stressing both in flexion and extension while permitting full extension. Patella tracking was assessed and found to be excellent. Therefore, the tibial guide position was marked on the proximal tibia. The patella thickness was measured and found to be 21 mm. Therefore,  the appropriate cut was made. The patellar surface was measured and found to be optimally replicated by the 34 mm component. The three peg holes were drilled in place before the trial button was inserted. Patella tracking was assessed and found to be excellent, passing the "no thumb test". The lug holes were drilled into the distal femur before the trial component was removed, leaving only the tibial tray. The keel was then created using the appropriate tower, reamer, and punch.  The bony surfaces were prepared for cementing by irrigating them thoroughly with bacitracin saline solution via the jet lavage system. A bone plug was fashioned from some of the bone that had been removed previously and used to plug the distal femoral canal. In addition, 20 cc of Exparel diluted out to 60 cc with normal saline and 30 cc of 0.5% Sensorcaine were injected into the postero-medial and postero-lateral aspects of the knee, the medial and lateral gutter regions, and the peri-incisional tissues to help with postoperative analgesia. Meanwhile, the cement was being mixed on the back table. When it was ready, the tibial tray was cemented in first. The excess cement was removed using [employer] fast streamer. Next, the femoral component was impacted into place. Again, the excess cement was removed using [employer] fast streamer. The 12 mm trial insert was positioned and the knee brought into extension while the cement hardened. Finally, the patella was cemented into place and secured using the patellar clamp. Again, the excess cement was removed using [employer] fast streamer. Once the cement had hardened, the knee was placed through a range of motion with the findings as described above. Therefore, the trial insert was removed and, after verifying that no cement had been retained posteriorly, the permanent 12 mm anterior stabilized E-polyethylene insert was positioned and secured using the appropriate key locking mechanism. Again the knee was placed through a  range of motion with the findings as described above.  The wound was copiously irrigated with sterile saline solution using the jet lavage system before the quadriceps tendon and retinacular layer were reapproximated using #0 Vicryl interrupted sutures. The superficial retinacular layer also was closed using a running #0 Vicryl suture. A total of 10 cc of transexemic acid (TXA) was injected intra-articularly before the subcutaneous tissues were closed in several layers using 2-0 Vicryl interrupted sutures. The skin was closed using staples. A sterile honeycomb dressing was applied to the skin before the leg was wrapped with an Ace wrap to accommodate the Polar Care device. The patient was then awakened, extubated, and returned to the recovery room in satisfactory condition after tolerating the procedure well.

## 2020-05-14 NOTE — Plan of Care (Signed)
?  Problem: Education: ?Goal: Knowledge of General Education information will improve ?Description: Including pain rating scale, medication(s)/side effects and non-pharmacologic comfort measures ?Outcome: Progressing ?  ?Problem: Health Behavior/Discharge Planning: ?Goal: Ability to manage health-related needs will improve ?Outcome: Progressing ?  ?Problem: Clinical Measurements: ?Goal: Will remain free from infection ?Outcome: Progressing ?Goal: Cardiovascular complication will be avoided ?Outcome: Progressing ?  ?Problem: Activity: ?Goal: Risk for activity intolerance will decrease ?Outcome: Progressing ?  ?

## 2020-05-14 NOTE — Transfer of Care (Signed)
Immediate Anesthesia Transfer of Care Note  Patient: Brandi Newton  Procedure(s) Performed: TOTAL KNEE ARTHROPLASTY (Left Knee)  Patient Location: PACU  Anesthesia Type:General  Level of Consciousness: awake, alert  and oriented  Airway & Oxygen Therapy: Patient Spontanous Breathing  Post-op Assessment: Report given to RN  Post vital signs: Reviewed and stable  Last Vitals:  Vitals Value Taken Time  BP    Temp    Pulse    Resp    SpO2      Last Pain:  Vitals:   05/14/20 0953  TempSrc:   PainSc: (P) 8          Complications: No apparent anesthesia complications

## 2020-05-14 NOTE — Anesthesia Preprocedure Evaluation (Signed)
Anesthesia Evaluation  Patient identified by MRN, date of birth, ID band Patient awake    Reviewed: Allergy & Precautions, H&P , NPO status , Patient's Chart, lab work & pertinent test results, reviewed documented beta blocker date and time   History of Anesthesia Complications (+) PONV, Family history of anesthesia reaction and history of anesthetic complications  Airway Mallampati: III  TM Distance: >3 FB Neck ROM: full   Comment: Good ROM at AO and no pain after previous neck surgery. Dental  (+) Teeth Intact   Pulmonary neg pulmonary ROS, asthma , former smoker,    Pulmonary exam normal        Cardiovascular Exercise Tolerance: Poor hypertension, On Medications negative cardio ROS Normal cardiovascular exam Rhythm:regular Rate:Normal     Neuro/Psych  Neuromuscular disease negative neurological ROS  negative psych ROS   GI/Hepatic negative GI ROS, Neg liver ROS, GERD  Medicated,  Endo/Other  negative endocrine ROS  Renal/GU negative Renal ROS  negative genitourinary   Musculoskeletal   Abdominal   Peds  Hematology negative hematology ROS (+) Blood dyscrasia, anemia ,   Anesthesia Other Findings Past Medical History: No date: Anemia     Comment:  H/O 8TH GRADE ONLY No date: Arthritis No date: Asthma No date: Bilateral ovarian cysts No date: Bronchitis No date: Constipation No date: Family history of adverse reaction to anesthesia     Comment:  mother PONV No date: GERD (gastroesophageal reflux disease)     Comment:  NO MEDS No date: H/O seasonal allergies No date: History of kidney stones No date: Hypertension No date: PONV (postoperative nausea and vomiting) No date: Sinus problem Past Surgical History: 05/11/2018: ANTERIOR CERVICAL DECOMP/DISCECTOMY FUSION; N/A     Comment:  Procedure: ANTERIOR CERVICAL DECOMPRESSION/DISCECTOMY               FUSION 3 LEVELS-C4-7;  Surgeon: Meade Maw, MD;                Location: [facility] ORS;  Service: Neurosurgery;  Laterality:               N/A; No date: back; Bilateral     Comment:  cervical disc 85 or 86: BACK SURGERY     Comment:  X2 1975?: BREAST EXCISIONAL BIOPSY; Right     Comment:  benign No date: CATARACT EXTRACTION 10/03/2015: LAPAROSCOPIC HYSTERECTOMY; Bilateral     Comment:  Procedure: HYSTERECTOMY TOTAL LAPAROSCOPIC, BILATERAL               SALPINGOOPHERECTOMY;  Surgeon: Honor Loh Ward, MD;                Location: [facility] OR[facility];  Service: Gynecology;  Laterality:               Bilateral; No date: nodules vocal cords 04/27/2019: TOTAL KNEE ARTHROPLASTY; Right     Comment:  Procedure: TOTAL KNEE ARTHROPLASTY - RIGHT;  Surgeon:               Corky Mull, MD;  Location: [facility] ORS;  Service:               Orthopedics;  Laterality: Right; 1984: TUBAL LIGATION BMI    Body Mass Index: 31.48 kg/m     Reproductive/Obstetrics negative OB ROS                             Anesthesia Physical Anesthesia Plan  ASA: III  Anesthesia Plan: General  ETT   Post-op Pain Management:    Induction:   PONV Risk Score and Plan: 4 or greater  Airway Management Planned:   Additional Equipment:   Intra-op Plan:   Post-operative Plan:   Informed Consent: I have reviewed the patients History and Physical, chart, labs and discussed the procedure including the risks, benefits and alternatives for the proposed anesthesia with the patient or authorized representative who has indicated his/her understanding and acceptance.     Dental Advisory Given  Plan Discussed with: CRNA  Anesthesia Plan Comments: (Prefers GOT secondary to previous back surgery. )        Anesthesia Quick Evaluation

## 2020-05-15 LAB — CBC
HCT: 31.5 % — ABNORMAL LOW (ref 36.0–46.0)
Hemoglobin: 10.3 g/dL — ABNORMAL LOW (ref 12.0–15.0)
MCH: 29.8 pg (ref 26.0–34.0)
MCHC: 32.7 g/dL (ref 30.0–36.0)
MCV: 91 fL (ref 80.0–100.0)
Platelets: 280 10*3/uL (ref 150–400)
RBC: 3.46 MIL/uL — ABNORMAL LOW (ref 3.87–5.11)
RDW: 14.7 % (ref 11.5–15.5)
WBC: 11.8 10*3/uL — ABNORMAL HIGH (ref 4.0–10.5)
nRBC: 0 % (ref 0.0–0.2)

## 2020-05-15 LAB — BASIC METABOLIC PANEL
Anion gap: 8 (ref 5–15)
BUN: 27 mg/dL — ABNORMAL HIGH (ref 8–23)
CO2: 24 mmol/L (ref 22–32)
Calcium: 9 mg/dL (ref 8.9–10.3)
Chloride: 104 mmol/L (ref 98–111)
Creatinine, Ser: 1.53 mg/dL — ABNORMAL HIGH (ref 0.44–1.00)
GFR calc Af Amer: 39 mL/min — ABNORMAL LOW (ref >60)
GFR calc non Af Amer: 34 mL/min — ABNORMAL LOW (ref >60)
Glucose, Bld: 136 mg/dL — ABNORMAL HIGH (ref 70–99)
Potassium: 4.7 mmol/L (ref 3.5–5.1)
Sodium: 136 mmol/L (ref 135–145)

## 2020-05-15 MED ORDER — ONDANSETRON HCL 4 MG PO TABS: 4.0000 mg | ORAL_TABLET | Freq: Four times a day (QID) | ORAL | 0 refills | Status: DC | PRN

## 2020-05-15 MED ORDER — ENOXAPARIN SODIUM 40 MG/0.4ML ~~LOC~~ SOLN: 40.0000 mg | SUBCUTANEOUS | 0 refills | Status: DC

## 2020-05-15 MED ORDER — CYCLOBENZAPRINE HCL 5 MG PO TABS: 5.0000 mg | ORAL_TABLET | Freq: Three times a day (TID) | ORAL | 0 refills | Status: DC | PRN

## 2020-05-15 MED ORDER — HYDROCODONE-ACETAMINOPHEN 7.5-325 MG PO TABS: 1.0000 | ORAL_TABLET | ORAL | 0 refills | Status: DC | PRN

## 2020-05-15 NOTE — Discharge Summary (Signed)
Physician Discharge Summary  Patient ID: Brandi Newton MRN: [id number] DOB/AGE: 72-Jan-1947 72 y.o.  Admit date: 05/14/2020 Discharge date: 05/15/2020  Admission Diagnoses:  Status post total knee replacement using cement, left [Z96.652]  Discharge Diagnoses: Patient Active Problem List   Diagnosis Date Noted  . Status post total knee replacement using cement, left 05/14/2020  . Status post total knee replacement using cement, right 04/27/2019  . Cervical radiculopathy 05/11/2018  . Lumbar radiculopathy 01/26/2018  . Bilateral carpal tunnel syndrome 01/24/2018  . S/P total hysterectomy and bilateral salpingo-oophorectomy 10/03/2015  . Primary osteoarthritis of both knees 01/24/2015  . Cough 02/14/2013  . Allergic rhinitis 02/14/2013  . GERD (gastroesophageal reflux disease) 02/14/2013  . 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 Anemia    H/O 8TH GRADE ONLY  . Arthritis   . Asthma   . Bilateral ovarian cysts   . Bronchitis   . Constipation   . Family history of adverse reaction to anesthesia    mother PONV  . GERD (gastroesophageal reflux disease)    NO MEDS  . H/O seasonal allergies   . History of kidney stones   . Hypertension   . PONV (postoperative nausea and vomiting)   . Sinus problem      Transfusion: None.   Consultants (if any):   Discharged Condition: Improved  Hospital Course: Brandi Newton is an 74 y.o. female who was admitted 05/14/2020 with a diagnosis of primary osteoarthritis of the left knee and went to the operating room on 05/14/2020 and underwent the above named procedures.    Surgeries: Procedure(s): TOTAL KNEE ARTHROPLASTY on 05/14/2020 Patient tolerated the surgery well. Taken to PACU where she was stabilized and then transferred to the orthopedic floor.  Started on Lovenox 40mg  q 24 hrs. Foot pumps applied bilaterally at 80 mm. Heels elevated on bed with rolled towels. No evidence of DVT. Negative  Homan. Physical therapy started on day #1 for gait training and transfer. OT started day #1 for ADL and assisted devices.  Patient's IV was removed on POD1.  Implants: Left TKA using all-cemented Biomet Vanguard system with a 70 mm PCR femur, a 75 mm tibial tray with a 12 mm anterior stabilized E-poly insert, and a 34 x 8.5 mm all-poly 3-pegged domed patella.  She was given perioperative antibiotics:  Anti-infectives (From admission, onward)   Start     Dose/Rate Route Frequency Ordered Stop   05/14/20 1300  ceFAZolin (ANCEF) IVPB 2g/100 mL premix     2 g 200 mL/hr over 30 Minutes Intravenous Every 6 hours 05/14/20 1133 05/15/20 0659   05/14/20 0602  ceFAZolin (ANCEF) 2-4 GM/100ML-% IVPB    Note to Pharmacy: Nyra Jabs   : cabinet override      05/14/20 0602 05/14/20 0757   05/14/20 0600  ceFAZolin (ANCEF) IVPB 2g/100 mL premix     2 g 200 mL/hr over 30 Minutes Intravenous On call to O.R. 05/14/20 6962 05/14/20 0732    .  She was given sequential compression devices, early ambulation, and Lovenox for DVT prophylaxis.  She benefited maximally from the hospital stay and there were no complications.    Recent vital signs:  Vitals:   05/15/20 0436 05/15/20 0823  BP: 136/70 132/60  Pulse: 68 67  Resp: 15 16  Temp: 97.7 F (36.5 C) 98.7 F (37.1 C)  SpO2: 94% 96%    Recent laboratory studies:  Lab Results  Component Value Date  HGB 10.3 (L) 05/15/2020   HGB 13.1 05/03/2020   HGB 10.2 (L) 04/28/2019   Lab Results  Component Value Date   WBC 11.8 (H) 05/15/2020   PLT 280 05/15/2020   Lab Results  Component Value Date   INR 0.99 05/03/2018   Lab Results  Component Value Date   NA 136 05/15/2020   K 4.7 05/15/2020   CL 104 05/15/2020   CO2 24 05/15/2020   BUN 27 (H) 05/15/2020   CREATININE 1.53 (H) 05/15/2020   GLUCOSE 136 (H) 05/15/2020    Discharge Medications:   Allergies as of 05/15/2020      Reactions   Percocet [oxycodone-acetaminophen] Nausea And  Vomiting   Ace Inhibitors Cough      Medication List    TAKE these medications   Calcium-Magnesium-Zinc 333.33-133.33-5 MG Tabs Take 1 tablet by mouth daily.   cetirizine 10 MG tablet Commonly known as: ZYRTEC Take 10 mg by mouth every morning.   cholecalciferol 25 MCG (1000 UNIT) tablet Commonly known as: VITAMIN D Take 1,000 Units by mouth daily.   cyanocobalamin 1000 MCG tablet Take 1,000 mcg by mouth daily.   cyclobenzaprine 5 MG tablet Commonly known as: FLEXERIL Take 1-2 tablets (5-10 mg total) by mouth 3 (three) times daily as needed for muscle spasms.   enoxaparin 40 MG/0.4ML injection Commonly known as: LOVENOX Inject 0.4 mLs (40 mg total) into the skin daily.   escitalopram 20 MG tablet Commonly known as: LEXAPRO Take 20 mg by mouth every morning.   fluticasone 50 MCG/ACT nasal spray Commonly known as: FLONASE Place 2 sprays into both nostrils daily as needed for allergies.   gabapentin 300 MG capsule Commonly known as: NEURONTIN Take 300 mg by mouth at bedtime.   hydrochlorothiazide 25 MG tablet Commonly known as: HYDRODIURIL Take 25 mg by mouth every morning.   HYDROcodone-acetaminophen 7.5-325 MG tablet Commonly known as: Norco Take 1-2 tablets by mouth every 4 (four) hours as needed for moderate pain. What changed: reasons to take this   ibuprofen 200 MG tablet Commonly known as: ADVIL Take 600 mg by mouth every 6 (six) hours as needed.   losartan 25 MG tablet Commonly known as: COZAAR Take 25 mg by mouth every morning.   naproxen 500 MG tablet Commonly known as: NAPROSYN Take 500 mg by mouth 2 (two) times daily as needed for mild pain.   ondansetron 4 MG tablet Commonly known as: ZOFRAN Take 1 tablet (4 mg total) by mouth every 6 (six) hours as needed for nausea.   polyethylene glycol 17 g packet Commonly known as: MIRALAX / GLYCOLAX Take 17 g by mouth daily.   ProAir HFA 108 (90 Base) MCG/ACT inhaler Generic drug:  albuterol Inhale 2 puffs into the lungs every 4 (four) hours as needed for wheezing or shortness of breath.   SAMBUCUS ELDERBERRY PO Take 2 capsules by mouth daily.            Durable Medical Equipment  (From admission, onward)         Start     Ordered   05/14/20 1134  DME Bedside commode  Once    Question:  Patient needs a bedside commode to treat with the following condition  Answer:  Status post total knee replacement using cement, left   05/14/20 1133   05/14/20 1134  DME 3 n 1  Once     05/14/20 1133   05/14/20 1134  DME Walker rolling  Once    Question Answer  Comment  Walker: With 5 Inch Wheels   Patient needs a walker to treat with the following condition Status post total knee replacement using cement, left      05/14/20 1133          Diagnostic Studies: US Venous Img Lower Unilateral Right (DVT)  Result Date: 05/03/2020 CLINICAL DATA:  Right lower extremity pain and edema for the past week. History of right knee replacement approximately 1 year ago. Evaluate for DVT. EXAM: RIGHT LOWER EXTREMITY VENOUS DOPPLER ULTRASOUND TECHNIQUE: Gray-scale sonography with graded compression, as well as color Doppler and duplex ultrasound were performed to evaluate the lower extremity deep venous systems from the level of the common femoral vein and including the common femoral, femoral, profunda femoral, popliteal and calf veins including the posterior tibial, peroneal and gastrocnemius veins when visible. The superficial great saphenous vein was also interrogated. Spectral Doppler was utilized to evaluate flow at rest and with distal augmentation maneuvers in the common femoral, femoral and popliteal veins. COMPARISON:  None. FINDINGS: Contralateral Common Femoral Vein: Respiratory phasicity is normal and symmetric with the symptomatic side. No evidence of thrombus. Normal compressibility. Common Femoral Vein: No evidence of thrombus. Normal compressibility, respiratory phasicity and  response to augmentation. Saphenofemoral Junction: No evidence of thrombus. Normal compressibility and flow on color Doppler imaging. Profunda Femoral Vein: No evidence of thrombus. Normal compressibility and flow on color Doppler imaging. Femoral Vein: No evidence of thrombus. Normal compressibility, respiratory phasicity and response to augmentation. Popliteal Vein: No evidence of thrombus. Normal compressibility, respiratory phasicity and response to augmentation. Calf Veins: Appear patent where imaged. Superficial Great Saphenous Vein: No evidence of thrombus. Normal compressibility. Venous Reflux:  None. Other Findings: Note is made of an approximately 12.0 x 3.5 x 1.1 cm minimally complex serpiginous fluid collection within the right popliteal fossa extending to the proximal aspect of the right calf favored to represent a leaking Baker's cyst. IMPRESSION: 1. No evidence of DVT within right lower extremity. 2. Note made of an approximately 12.0 cm right-sided suspected leaking Baker cyst. Electronically Signed   By: Sandi Mariscal M.D.   On: 05/03/2020 12:16   DG Knee Left Port  Result Date: 05/14/2020 CLINICAL DATA:  Post left knee arthroplasty EXAM: PORTABLE LEFT KNEE - 1-2 VIEW COMPARISON:  None. FINDINGS: Changes of left knee replacement. No hardware bony complicating feature. Soft tissue and joint space gas. Soft tissue drain in place. IMPRESSION: Left knee replacement.  No visible complicating feature. Electronically Signed   By: Rolm Baptise M.D.   On: 05/14/2020 11:55   Disposition: Plan for discharge home today with HHPT.  Follow-up Information    Lattie Corns, PA-C Follow up in 14 day(s).   Specialty: Physician Assistant Why: Electa Sniff information: [place] [address] [phone]          Signed: Judson Roch PA-C 05/15/2020, 10:14 AM

## 2020-05-15 NOTE — Progress Notes (Signed)
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  PMH includes: depression, DJD, HTN, C4-7 ACDF, R TKA, and anemia.    PT Comments    Pt pleasant and motivated to participate during the session.  Pt made very good progress towards goals this session.  Pt was able to amb with step-to pattern that quickly progressed to step-through pattern with cues for sequencing.  Pt was steady ascending and descend steps with education provided on forward and backward technique with pt preferring forward technique and able to demonstrate very good control and stability doing so. Pt reported no adverse symptoms other than min to mod L knee pain with SpO2 and HR WNL throughout the session. Pt will benefit from HHPT services upon discharge to safely address deficits listed in patient problem list for decreased caregiver assistance and eventual return to PLOF.       Follow Up Recommendations  Home health PT;Supervision for mobility/OOB     Equipment Recommendations  None recommended by PT    Recommendations for Other Services       Precautions / Restrictions Precautions Precautions: Fall;Knee Precaution Booklet Issued: Yes (comment) Restrictions Weight Bearing Restrictions: Yes LLE Weight Bearing: Weight bearing as tolerated    Mobility  Bed Mobility Overal bed mobility: Modified Independent             General bed mobility comments: Extra time and effort  Transfers Overall transfer level: Needs assistance Equipment used: Rolling walker (2 wheeled) Transfers: Sit to/from Stand Sit to Stand: Supervision;From elevated surface         General transfer comment: Min verbal cues for sequencing  Ambulation/Gait Ambulation/Gait assistance: Min guard;Supervision Gait Distance (Feet): 200 Feet x 1, 125 Feet x  1 Assistive device: Rolling walker (2 wheeled) Gait Pattern/deviations: Decreased stance time - left;Antalgic;Step-through pattern;Step-to pattern Gait velocity: decreased   General Gait Details: Step-to pattern quickly progressing to step-through pattern with good control and stability with min verbal cues for amb closer to RW with upright posture and for step-through pattern   Stairs Stairs: Yes Stairs assistance: Min guard Stair Management: No rails;Forwards Number of Stairs: 1 General stair comments: Ascend/descend 1 step x 4 with mod verbal and visual cues initially for sequencing but pt demonstrated good carryover and was steady with good control throughout   Wheelchair Mobility    Modified Rankin (Stroke Patients Only)       Balance Overall balance assessment: Needs assistance   Sitting balance-Leahy Scale: Normal     Standing balance support: Bilateral upper extremity supported Standing balance-Leahy Scale: Good                              Cognition Arousal/Alertness: Awake/alert Behavior During Therapy: WFL for tasks assessed/performed Overall Cognitive Status: Within Functional Limits for tasks assessed                                        Exercises Total Joint Exercises Ankle Circles/Pumps: Strengthening;Both;10 reps Quad Sets: AROM;Strengthening;Left;10 reps;15 reps Gluteal Sets: Strengthening;Both;10 reps Hip ABduction/ADduction: AROM;Both;10 reps Straight Leg Raises: AAROM;Left;10 reps Long Arc Quad: AROM;Strengthening;Left;10 reps;15 reps Knee Flexion: AROM;Strengthening;Left;10 reps;15 reps Goniometric ROM: L knee AROM:  2-103 deg Other Exercises Other Exercises: HEP education and review per handout Other Exercises: Positioning education/review to encourage L knee ext PROM and prevent heel skin breakdown Other Exercises: Car transfer sequencing education with chair for simulation    General Comments         Pertinent Vitals/Pain Pain Assessment: 0-10 Pain Score: 5  Pain Location: L knee Pain Descriptors / Indicators: Sore;Aching Pain Intervention(s): Premedicated before session;Monitored during session    Home Living                      Prior Function            PT Goals (current goals can now be found in the care plan section) Progress towards PT goals: Progressing toward goals    Frequency    BID      PT Plan Current plan remains appropriate    Co-evaluation              AM-PAC PT "6 Clicks" Mobility   Outcome Measure  Help needed turning from your back to your side while in a flat bed without using bedrails?: A Little Help needed moving from lying on your back to sitting on the side of a flat bed without using bedrails?: A Little Help needed moving to and from a bed to a chair (including a wheelchair)?: A Little Help needed standing up from a chair using your arms (e.g., wheelchair or bedside chair)?: A Little Help needed to walk in hospital room?: A Little Help needed climbing 3-5 steps with a railing? : A Little 6 Click Score: 18    End of Session Equipment Utilized During Treatment: Gait belt Activity Tolerance: Patient tolerated treatment well Patient left: in chair;with chair alarm set;with SCD's reapplied;with call bell/phone within reach;Other (comment) Nurse Communication: Mobility status;Other (comment)(chair alarm needed new batteries) PT Visit Diagnosis: Pain;Muscle weakness (generalized) (M62.81);Other abnormalities of gait and mobility (R26.89) Pain - Right/Left: Left Pain - part of body: Knee     Time: 1219-7588 PT Time Calculation (min) (ACUTE ONLY): 55 min  Charges:  $Gait Training: 23-37 mins $Therapeutic Exercise: 8-22 mins $Therapeutic Activity: 8-22 mins                     D. Scott Zoran Yankee PT, DPT 05/15/20, 10:49 AM

## 2020-05-15 NOTE — TOC Transition Note (Signed)
Transition of Care [facility]) - CM/SW Discharge Note   Patient Details  Name: Brandi Newton MRN: [id number] Date of Birth: 1946/11/14  Transition of [facility] CM/SW Contact:  Elease Hashimoto, LCSW Phone Number: 05/15/2020, 10:32 AM   Clinical Narrative:   Pt ready to Dc home, did well in PT sessions and feels ready to go home. Husband can assist if needed. Pt has been through this last year and knows what to do and what to expect. Has all equipment and follow up set up via [facility]. No further follow due to DC today.     Final next level of care: Home w Home Health Services Barriers to Discharge: Barriers Resolved   Patient Goals and CMS Choice Patient states their goals for this hospitalization and ongoing recovery are:: I plan to go home and heal I did this last year, so now I'm done CMS Medicare.gov Compare Post Acute Care list provided to:: Patient Choice offered to / list presented to : Patient  Discharge Placement                Patient to be transferred to facility by: Husband via car Name of family member notified: Husband Patient and family notified of of transfer: 05/15/20  Discharge Plan and Services In-house Referral: Clinical Social Work   Post Acute Care Choice: [place]: PT [place]: [facility] Date Campus: 05/14/20 Time [place]: 1612 Representative spoke with at [place]: [place] [facility]) Interventions     Readmission Risk Interventions Readmission Risk Prevention Plan 04/29/2019  Post Dischage Appt Complete  Medication Screening Complete  Transportation Screening Complete  Some recent data might be hidden

## 2020-05-15 NOTE — Discharge Instructions (Signed)

## 2020-05-15 NOTE — Plan of Care (Signed)
°  Problem: Education: Goal: Knowledge of General Education information will improve Description: Including pain rating scale, medication(s)/side effects and non-pharmacologic comfort measures Outcome: Adequate for Discharge   Problem: Health Behavior/Discharge Planning: Goal: Ability to manage health-related needs will improve Outcome: Adequate for Discharge   Problem: Clinical Measurements: Goal: Ability to maintain clinical measurements within normal limits will improve Outcome: Adequate for Discharge Goal: Will remain free from infection Outcome: Adequate for Discharge Goal: Diagnostic test results will improve Outcome: Adequate for Discharge Goal: Respiratory complications will improve Outcome: Adequate for Discharge Goal: Cardiovascular complication will be avoided Outcome: Adequate for Discharge   Problem: Nutrition: Goal: Adequate nutrition will be maintained Outcome: Adequate for Discharge   Problem: Activity: Goal: Risk for activity intolerance will decrease Outcome: Adequate for Discharge   Problem: Coping: Goal: Level of anxiety will decrease Outcome: Adequate for Discharge   Problem: Elimination: Goal: Will not experience complications related to bowel motility Outcome: Adequate for Discharge Goal: Will not experience complications related to urinary retention Outcome: Adequate for Discharge   Problem: Safety: Goal: Ability to remain free from injury will improve Outcome: Adequate for Discharge   Problem: Skin Integrity: Goal: Risk for impaired skin integrity will decrease Outcome: Adequate for Discharge

## 2020-05-15 NOTE — Progress Notes (Signed)
  Subjective: 1 Day Post-Op Procedure(s) (LRB): TOTAL KNEE ARTHROPLASTY (Left) Patient reports pain as mild.   Patient is well, and has had no acute complaints or problems Plan is to go Home after hospital stay. Negative for chest pain and shortness of breath Fever: no Gastrointestinal:Negative for nausea and vomiting  Objective: Vital signs in last 24 hours: Temp:  [97.4 F (36.3 C)-98.1 F (36.7 C)] 97.7 F (36.5 C) (06/09 0436) Pulse Rate:  [68-78] 68 (06/09 0436) Resp:  [10-19] 15 (06/09 0436) BP: (102-141)/(59-82) 136/70 (06/09 0436) SpO2:  [92 %-100 %] 94 % (06/09 0436) FiO2 (%):  [28 %] 28 % (06/08 1134)  Intake/Output from previous day:  Intake/Output Summary (Last 24 hours) at 05/15/2020 0804 Last data filed at 05/14/2020 1830 Gross per 24 hour  Intake 2027.5 ml  Output 6 ml  Net 2021.5 ml    Intake/Output this shift: No intake/output data recorded.  Labs: Recent Labs    05/15/20 0444  HGB 10.3*   Recent Labs    05/15/20 0444  WBC 11.8*  RBC 3.46*  HCT 31.5*  PLT 280   Recent Labs    05/15/20 0444  NA 136  K 4.7  CL 104  CO2 24  BUN 27*  CREATININE 1.53*  GLUCOSE 136*  CALCIUM 9.0   No results for input(s): LABPT, INR in the last 72 hours.   EXAM General - Patient is Alert, Appropriate and Oriented Extremity - ABD soft Neurovascular intact Sensation intact distally Intact pulses distally Dorsiflexion/Plantar flexion intact Incision: scant drainage No cellulitis present Dressing/Incision - blood tinged drainage to the most distal aspect of the incision Motor Function - intact, moving foot and toes well on exam.  Abdomen soft with normal BS. Negative Homans to bilateral legs.  Past Medical History:  Diagnosis Date  . Anemia    H/O 8TH GRADE ONLY  . Arthritis   . Asthma   . Bilateral ovarian cysts   . Bronchitis   . Constipation   . Family history of adverse reaction to anesthesia    mother PONV  . GERD (gastroesophageal reflux  disease)    NO MEDS  . H/O seasonal allergies   . History of kidney stones   . Hypertension   . PONV (postoperative nausea and vomiting)   . Sinus problem     Assessment/Plan: 1 Day Post-Op Procedure(s) (LRB): TOTAL KNEE ARTHROPLASTY (Left) Active Problems:   Status post total knee replacement using cement, left  Estimated body mass index is 31.48 kg/m as calculated from the following:   Height as of this encounter: 5\' 7"  (1.702 m).   Weight as of this encounter: 91.2 kg. Advance diet Up with therapy D/C IV fluids when tolerating po intake.  Labs reviewed this AM. Patient is passing gas. Continue with PT today. Possible d/c home pending PT and clearing stairs.  DVT Prophylaxis - Lovenox, Foot Pumps and TED hose Weight-Bearing as tolerated to left leg  J. Cameron Proud, PA-C [facility] 05/15/2020, 8:04 AM

## 2020-05-19 NOTE — Anesthesia Postprocedure Evaluation (Signed)
Anesthesia Post Note  Patient: Brandi Newton  Procedure(s) Performed: TOTAL KNEE ARTHROPLASTY (Left Knee)  Patient location during evaluation: PACU Anesthesia Type: General Level of consciousness: awake and alert Pain management: pain level controlled Vital Signs Assessment: post-procedure vital signs reviewed and stable Respiratory status: spontaneous breathing, nonlabored ventilation, respiratory function stable and patient connected to nasal cannula oxygen Cardiovascular status: blood pressure returned to baseline and stable Postop Assessment: no apparent nausea or vomiting Anesthetic complications: no   No complications documented.   Last Vitals:  Vitals:   05/15/20 0436 05/15/20 0823  BP: 136/70 132/60  Pulse: 68 67  Resp: 15 16  Temp: 36.5 C 37.1 C  SpO2: 94% 96%    Last Pain:  Vitals:   05/15/20 0937  TempSrc:   PainSc: [place] Tariq Pernell

## 2020-06-28 DIAGNOSIS — L03116 Cellulitis of left lower limb: Secondary | ICD-10-CM | POA: Insufficient documentation

## 2020-09-10 ENCOUNTER — Other Ambulatory Visit: Payer: Self-pay | Admitting: Family Medicine

## 2020-09-10 DIAGNOSIS — Z1231 Encounter for screening mammogram for malignant neoplasm of breast: Secondary | ICD-10-CM

## 2020-10-08 ENCOUNTER — Ambulatory Visit
Admission: RE | Admit: 2020-10-08 | Discharge: 2020-10-08 | Disposition: A | Discharge status: Home / Self Care | Payer: Medicare PPO | Source: Ambulatory Visit | Attending: Family Medicine | Admitting: Family Medicine

## 2020-10-08 ENCOUNTER — Other Ambulatory Visit: Payer: Self-pay

## 2020-10-08 DIAGNOSIS — Z1231 Encounter for screening mammogram for malignant neoplasm of breast: Secondary | ICD-10-CM | POA: Diagnosis not present

## 2020-12-17 ENCOUNTER — Other Ambulatory Visit (HOSPITAL_COMMUNITY): Payer: Self-pay | Admitting: Nurse Practitioner

## 2020-12-17 ENCOUNTER — Other Ambulatory Visit: Payer: Self-pay | Admitting: Nurse Practitioner

## 2020-12-17 DIAGNOSIS — M5442 Lumbago with sciatica, left side: Secondary | ICD-10-CM

## 2020-12-17 DIAGNOSIS — G8929 Other chronic pain: Secondary | ICD-10-CM

## 2020-12-17 DIAGNOSIS — M546 Pain in thoracic spine: Secondary | ICD-10-CM

## 2020-12-22 ENCOUNTER — Ambulatory Visit: Payer: Medicare PPO

## 2020-12-22 ENCOUNTER — Other Ambulatory Visit: Payer: Medicare PPO

## 2020-12-24 ENCOUNTER — Ambulatory Visit
Admission: RE | Admit: 2020-12-24 | Discharge: 2020-12-24 | Disposition: A | Discharge status: Home / Self Care | Payer: Medicare PPO | Source: Ambulatory Visit | Attending: Nurse Practitioner | Admitting: Nurse Practitioner

## 2020-12-24 ENCOUNTER — Other Ambulatory Visit: Payer: Self-pay

## 2020-12-24 DIAGNOSIS — M546 Pain in thoracic spine: Secondary | ICD-10-CM | POA: Insufficient documentation

## 2020-12-24 DIAGNOSIS — G8929 Other chronic pain: Secondary | ICD-10-CM

## 2020-12-24 DIAGNOSIS — M5442 Lumbago with sciatica, left side: Secondary | ICD-10-CM | POA: Insufficient documentation

## 2021-03-17 DIAGNOSIS — M899 Disorder of bone, unspecified: Secondary | ICD-10-CM | POA: Insufficient documentation

## 2021-03-17 DIAGNOSIS — Z79899 Other long term (current) drug therapy: Secondary | ICD-10-CM | POA: Insufficient documentation

## 2021-03-17 DIAGNOSIS — R937 Abnormal findings on diagnostic imaging of other parts of musculoskeletal system: Secondary | ICD-10-CM | POA: Insufficient documentation

## 2021-03-17 DIAGNOSIS — G894 Chronic pain syndrome: Secondary | ICD-10-CM | POA: Insufficient documentation

## 2021-03-17 DIAGNOSIS — Z789 Other specified health status: Secondary | ICD-10-CM | POA: Insufficient documentation

## 2021-03-17 NOTE — Progress Notes (Signed)
Patient: Brandi Newton  Service Category: E/M  Provider: Gaspar Cola, MD  DOB: [birth year]  DOS: 03/19/2021  Referring Provider: Deetta Perla, MD  MRN: [id number]  Setting: Ambulatory outpatient  PCP: Ranae Plumber, PA  Type: New Patient  Specialty: Interventional Pain Management    Location: Office  Delivery: Face-to-face     Primary Reason(s) for Visit: Encounter for initial evaluation of one or more chronic problems (new to examiner) potentially causing chronic pain, and posing a threat to normal musculoskeletal function. (Level of risk: High) CC: Back Pain (Upper and lower) and Knee Pain (bilateral)  HPI  Brandi Newton is a 75 y.o. year old, female patient, who comes for the first time to our practice referred by Deetta Perla, MD for our initial evaluation of her chronic pain. She has Cough; Allergic rhinitis; GERD (gastroesophageal reflux disease); Singer's nodule; S/P total hysterectomy and bilateral salpingo-oophorectomy; Primary osteoarthritis of both knees; Lumbar radiculopathy; Bilateral carpal tunnel syndrome; Cervical radiculopathy; Status post total knee replacement using cement, right; Status post total knee replacement using cement, left; Cellulitis of left knee; Edema of right lower leg; Primary osteoarthritis of left knee; Primary osteoarthritis of right knee; Cervical myelopathy ([place]); Knee joint replaced by other means; Chronic pain syndrome; Pharmacologic therapy; Disorder of skeletal system; Problems influencing health status; Abnormal MRI, cervical spine (10/19/2018); Abnormal MRI, lumbar spine (12/24/2020); Abnormal MRI, thoracic spine (12/24/2020); Chronic low back pain (1ry area of Pain) (Bilateral) (R>L) w/o sciatica; 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 Today she comes in for evaluation of her Back Pain (Upper and lower) and Knee Pain (bilateral)  Pain Assessment: Location: Lower,Mid Back Radiating: denies Onset: More than a month ago Duration: Chronic pain Quality: Sore (sensitive) Severity: 6 /10 (subjective, self-reported pain score)  Effect on ADL: limits activities Timing: Constant Modifying factors: sitting BP: 138/70  HR: 74  Onset and Duration: Sudden, Gradual and Present longer than 3 months Cause of pain: pressure points Severity: NAS-11 now: 6/10 Timing: Not influenced by the time of the day, During activity or exercise, After activity or exercise and After a period of immobility Aggravating Factors: Bending, Climbing, Kneeling, Lifiting, Motion, Prolonged sitting, Prolonged standing, Squatting, Stooping , Twisting, Walking and Walking uphill Alleviating Factors: Resting and Sitting Associated Problems: Night-time cramps, Inability to control bladder (urine), Numbness and Tingling Quality of Pain: Burning, Tender, Tingling and Uncomfortable Previous Examinations or Tests: Bone scan, MRI scan, X-rays, Nerve conduction test and Orthopedic evaluation Previous Treatments: Epidural steroid injections  According to the patient her primary area of pain is that of the lower back (Bilateral) (R>L).  She denies any prior lumbar spine surgery, physical therapy, but does admit to having had a nerve block done in the past.  She also had a recent MRI ordered by Dr. Lacinda Axon.  According to her records she had some epidural steroid injections done at [facility] by Dr. Jola Baptist.   The patient secondary area pain is that of the upper back (Bilateral) (R>L).  The patient denies any thoracic surgeries but she did have a posterior cervical decompression 35 years ago.  She denies any physical therapy or recent x-rays of that area.  She  also denies any nerve blocks in that area.  According to the medical record the patient also had nerve conduction test done by Dr. Melrose Nakayama at the [facility].  The patient does have a history of a C4-C7 cervical fusion done on 05/11/2018 by Dr. Cari Caraway.  The patient's third area pain is that of the lower extremities (Bilateral) (L>R).  Specifically the pain is in the area of the knees, bilaterally, s/p bilateral total knee replacement.  In the case of the left knee, which is the one that hurts the most, the pain seems to be anterior to the knee, just below the patella.  In addition, the patient complains of bilateral feet tingling and a sensation of them being called.  However, she indicates that her husband tells her that her feet are not cold, but she does feel them very cold.  She is given gabapentin for this, which she indicates does not work.  She refers that the only thing that seems to work is wrapping her feet with a blanket.  Physical exam: The patient had negative findings on toe walk and heel walk, both of which she was able to do although her balance seems to be less than perfect.  In addition, the patient had negative bilateral straight leg raise.  She did have a positive bilateral lumbar facet arthralgia on hyperextension on rotation maneuvers of the lumbar spine.  She also had pain across the lower back with simple hyperextension of the lumbar spine.  Provocative Patrick maneuver was positive bilaterally for sacroiliac joint arthralgia with the left side being worse than the right.  The patient also demonstrated decreased range of motion of the left hip, but did not experience any significant pain in that area with maneuver.  In addition, the patient had exquisite tenderness to palpation over the PSIS with the right side being worse than the left hand the right side referring pain towards the right shoulder when pressure was exerted over the PSIS trigger point.  Today I took the time to  provide the patient with information regarding my pain practice. The patient was informed that my practice is divided into two sections: an interventional pain management section, as well as a completely separate and distinct medication management section. I explained that I have procedure days for my interventional therapies, and evaluation days for follow-ups and medication management. Because of the amount of documentation required during both, they are kept separated. This means that there is the possibility that she may be scheduled for a procedure on one day, and medication management the next. I have also informed her that because of staffing and facility limitations, I no longer take patients for medication management only. To illustrate the reasons for this, I gave the patient the example of surgeons, and how inappropriate it would be to refer a patient to his/her care, just to write for the post-surgical antibiotics on a surgery done by a different surgeon.   Because interventional pain management is my board-certified specialty, the patient was informed that joining my practice means that they are open to any and all interventional therapies. I made it clear that this does not mean that they will be forced to have any procedures done. What this means is that I believe interventional therapies to be essential part of the diagnosis and proper management of chronic pain conditions. Therefore, patients not interested in these interventional alternatives will be better served under the care of a different practitioner.  The patient was also made aware of my Comprehensive Pain Management Safety Guidelines where by  joining my practice, they limit all of their nerve blocks and joint injections to those done by our practice, for as long as we are retained to manage their care.   Historic Controlled Substance Pharmacotherapy Review  PMP and historical list of controlled substances: GuGuaiatussin Ac Liquid (#  120); hydrocodone/APAP 10/325, 1 tab p.o. every 4 hours Current opioid analgesics: None MME/day: 0 mg/day  Historical Monitoring: The patient  reports no history of drug use. List of all UDS Test(s): No results found for: MDMA, COCAINSCRNUR, [place], [place], CANNABQUANT, THCU, [place] List of other Serum/Urine Drug Screening Test(s):  No results found for: AMPHSCRSER, BARBSCRSER, BENZOSCRSER, COCAINSCRSER, COCAINSCRNUR, PCPSCRSER, PCPQUANT, THCSCRSER, THCU, CANNABQUANT, OPIATESCRSER, OXYSCRSER, PROPOXSCRSER, ETH Historical Background Evaluation: [place] PMP: PDMP reviewed during this encounter. Online review of the past 55-monthperiod conducted.             PMP NARX Score Report:  Narcotic: 170 Sedative: 070 Stimulant: 000 [place] Department of public safety, offender search: (Editor, commissioningInformation) Non-contributory Risk Assessment Profile: Aberrant behavior: None observed or detected today Risk factors for fatal opioid overdose: None identified today PMP NARX Overdose Risk Score: 360 Fatal overdose hazard ratio (HR): Calculation deferred Non-fatal overdose hazard ratio (HR): Calculation deferred Risk of opioid abuse or dependence: 0.7-3.0% with doses ? 36 MME/day and 6.1-26% with doses ? 120 MME/day. Substance use disorder (SUD) risk level: See below Personal History of Substance Abuse (SUD-Substance use disorder):  Alcohol: Negative  Illegal Drugs: Negative  Rx Drugs: Negative  ORT Risk Level calculation: Low Risk  Opioid Risk Tool - 03/19/21 1138      Family History of Substance Abuse   Alcohol Positive Female    Illegal Drugs Negative    Rx Drugs Negative      Personal History of Substance Abuse   Alcohol Negative    Illegal Drugs Negative    Rx Drugs Negative      Age   Age between 15-45years  No      History of Preadolescent Sexual Abuse   History of Preadolescent Sexual Abuse Negative or Female      Psychological Disease   Psychological Disease Negative    Depression  Positive      Total Score   Opioid Risk Tool Scoring 2    Opioid Risk Interpretation Low Risk          ORT Scoring interpretation table:  Score <3 = Low Risk for SUD  Score between 4-7 = Moderate Risk for SUD  Score >8 = High Risk for Opioid Abuse   PHQ-2 Depression Scale:  Total score: 0  PHQ-2 Scoring interpretation table: (Score and probability of major depressive disorder)  Score 0 = No depression  Score 1 = 15.4% Probability  Score 2 = 21.1% Probability  Score 3 = 38.4% Probability  Score 4 = 45.5% Probability  Score 5 = 56.4% Probability  Score 6 = 78.6% Probability   PHQ-9 Depression Scale:  Total score: 0  PHQ-9 Scoring interpretation table:  Score 0-4 = No depression  Score 5-9 = Mild depression  Score 10-14 = Moderate depression  Score 15-19 = Moderately severe depression  Score 20-27 = Severe depression (2.4 times higher risk of SUD and 2.89 times higher risk of overuse)   Pharmacologic Plan: As per protocol, I have not taken over any controlled substance management, pending the results of ordered tests and/or consults.            Initial impression: Pending review of available data and  ordered tests.  Meds   Current Outpatient Medications:  .  acetaminophen (TYLENOL) 500 MG tablet, Take by mouth., Disp: , Rfl:  .  albuterol (VENTOLIN HFA) 108 (90 Base) MCG/ACT inhaler, Inhale 2 puffs into the lungs every 4 (four) hours as needed for wheezing or shortness of breath. , Disp: , Rfl:  .  atorvastatin (LIPITOR) 20 MG tablet, Take 20 mg by mouth daily., Disp: , Rfl:  .  benzonatate (TESSALON) 100 MG capsule, Take 100 mg by mouth daily as needed for cough., Disp: , Rfl:  .  cetirizine (ZYRTEC) 10 MG tablet, Take 10 mg by mouth every morning., Disp: , Rfl:  .  cholecalciferol (VITAMIN D) 1000 units tablet, Take 1,000 Units by mouth daily. , Disp: , Rfl:  .  cyanocobalamin 1000 MCG tablet, Take 1,000 mcg by mouth daily., Disp: , Rfl:  .  escitalopram (LEXAPRO) 20 MG  tablet, Take 20 mg by mouth every morning. , Disp: , Rfl:  .  fluticasone (FLONASE) 50 MCG/ACT nasal spray, Place 2 sprays into both nostrils daily as needed for allergies. , Disp: , Rfl:  .  gabapentin (NEURONTIN) 300 MG capsule, Take 300 mg by mouth at bedtime. , Disp: , Rfl:  .  hydrochlorothiazide (HYDRODIURIL) 25 MG tablet, Take 25 mg by mouth every morning. , Disp: , Rfl:  .  losartan (COZAAR) 25 MG tablet, Take 50 mg by mouth every morning., Disp: , Rfl:  .  polyethylene glycol (MIRALAX / GLYCOLAX) packet, Take 17 g by mouth daily., Disp: , Rfl:  .  tiotropium (SPIRIVA) 18 MCG inhalation capsule, Place 18 mcg into inhaler and inhale daily., Disp: , Rfl:   Imaging Review  Cervical Imaging: Cervical MR wo contrast: Results for orders placed during the hospital encounter of 10/19/18 MR CERVICAL SPINE WO CONTRAST  Narrative CLINICAL DATA:  Initial evaluation for bilateral arm numbness and tingling with extension down spine since surgery in June of 2019.  EXAM: MRI CERVICAL SPINE WITHOUT CONTRAST  TECHNIQUE: Multiplanar, multisequence MR imaging of the cervical spine was performed. No intravenous contrast was administered.  COMPARISON:  Prior radiographs from 05/11/2018 as well as previous MRI from 01/21/2018.  FINDINGS: Alignment: Straightening of the normal cervical lordosis. Trace anterolisthesis of C4 on C5, improved relative to preoperative exam.  Vertebrae: Susceptibility artifact from prior ACDF at C4 through C7. Vertebral body heights maintained. No acute or interval fracture. Bone marrow signal intensity within normal limits. No discrete or worrisome osseous lesions. No abnormal marrow edema.  Cord: Persistent T2 signal abnormality within the cervical spinal cord at the level of C4-5, compatible with chronic myelomalacia. Signal intensity within the cervical spinal cord otherwise within normal limits.  Posterior Fossa, vertebral arteries, paraspinal tissues:  Visualized brain and posterior fossa normal. Paraspinous and prevertebral soft tissues demonstrate no acute finding. Remote postsurgical changes noted. Normal intravascular flow voids present within the vertebral arteries bilaterally. Left vertebral artery dominant.  Disc levels:  C2-C3: Mild disc bulge. Left greater than right uncovertebral spurring with left-sided facet hypertrophy. Mild left C3 foraminal narrowing. No significant canal or right foraminal stenosis. Appearance is stable from previous.  C3-C4: Diffuse circumferential disc osteophyte complex with intervertebral disc space narrowing. Severe bilateral facet arthrosis, slightly worse on the left. Borderline mild spinal stenosis, stable. Severe right with moderate left C4 foraminal narrowing, also unchanged.  C4-C5: Prior ACDF. No significant residual spinal stenosis. Moderate right with mild left C5 foraminal stenosis, also improved.  C5-C6: Prior fusion. No residual spinal stenosis. Left  greater than right uncovertebral hypertrophy with resultant moderate left and mild right C6 foraminal narrowing.  C6-C7: Prior ACDF. No residual spinal stenosis. Residual mild to moderate right C7 foraminal narrowing, improved from previous.  C7-T1:  Mild facet and ligament flavum hypertrophy.  No stenosis.  Visualized upper thoracic spine demonstrates no significant finding.  IMPRESSION: 1. Prior ACDF at C4 through C7 without residual spinal stenosis. Improved multilevel foraminal narrowing at these levels as above, consistent with interval foraminotomies. 2. Degenerative disc osteophyte at C3-4 with resultant severe right and moderate left C4 foraminal stenosis, stable. 3. Persistent T2 signal abnormality within the cervical spinal cord at C4-5, compatible with chronic myelomalacia.   Electronically Signed By: Jeannine Boga M.D. On: 10/19/2018 20:13  Cervical DG 2-3 views: Results for orders placed during the  hospital encounter of 05/11/18 DG Cervical Spine 2 or 3 views  Narrative CLINICAL DATA:  Status post cervical spine fusion.  EXAM: CERVICAL SPINE - 2-3 VIEW  COMPARISON:  MR cervical spine dated January 21, 2018.  FINDINGS: The lateral view is diagnostic to the C7 level. Interval C4-C7 ACDF with interbody grafts. There is no acute fracture or subluxation. Vertebral body heights are preserved. Alignment is normal. Mild disc height loss at C3-C4. Facet arthropathy at C3-C4 and C7-T1. Postsurgical prevertebral soft tissue swelling in the mid to lower cervical spine.  IMPRESSION: C4-C7 ACDF without evidence of acute postoperative complication.   Electronically Signed By: Titus Dubin M.D. On: 05/11/2018 18:55  Thoracic Imaging: Thoracic MR wo contrast: Results for orders placed during the hospital encounter of 12/24/20  MR THORACIC SPINE WO CONTRAST  Narrative CLINICAL DATA:  Mid back pain along bra line  EXAM: MRI THORACIC SPINE WITHOUT CONTRAST  TECHNIQUE: Multiplanar, multisequence MR imaging of the thoracic spine was performed. No intravenous contrast was administered.  COMPARISON:  None.  FINDINGS: Alignment:  Normal  Vertebrae: Negative for fracture or mass. Multilevel disc degeneration. Discogenic edema at T3-4 and T5-6.  Cord:  Normal signal and morphology.  No cord lesion.  Paraspinal and other soft tissues: Metallic fragments in the posterior soft tissues lower neck compatible with prior cervical spine surgery. No soft tissue mass.  Disc levels:  T1-2: Mild disc degeneration and spurring. Mild foraminal narrowing bilaterally  T2-3: Mild disc and facet degeneration. Mild foraminal narrowing bilaterally.  T3-4: Moderate disc degeneration with disc space narrowing. Diffuse endplate spurring. No significant stenosis  T4-5: Moderate disc degeneration with diffuse endplate spurring. Negative for stenosis.  T5-6: Moderate disc degeneration.  Shallow central disc protrusion without significant stenosis.  T6-7: Left paracentral disc protrusion. Cord flattening on the left without significant spinal or foraminal stenosis  T7-8: Central extruded disc fragment with downgoing disc material. Small disc protrusion without significant spinal or foraminal stenosis.  T8-9: Disc degeneration with Schmorl's node. Mild facet degeneration. Negative for stenosis  T9-10: Disc degeneration with diffuse endplate spurring. Bilateral facet degeneration. Mild foraminal narrowing bilaterally.  T10-11: Negative  T11-12: Mild disc and facet degeneration with small central disc protrusion. Negative for stenosis  T12-L1: Small central disc protrusion.  Negative for stenosis.  IMPRESSION: Multilevel disc and facet degeneration in the thoracic spine. Mild foraminal narrowing bilaterally T1-2 and T2-3 and T9-10. Multiple small disc protrusions.  Electronically Signed By: Franchot Gallo M.D. On: 12/24/2020 13:27  Lumbosacral Imaging: Lumbar MR wo contrast: Results for orders placed during the hospital encounter of 12/24/20 MR LUMBAR SPINE WO CONTRAST  Narrative CLINICAL DATA:  Low back pain bilateral leg pain  EXAM:  MRI LUMBAR SPINE WITHOUT CONTRAST  TECHNIQUE: Multiplanar, multisequence MR imaging of the lumbar spine was performed. No intravenous contrast was administered.  COMPARISON:  Lumbar MRI 01/21/2018  FINDINGS: Segmentation:  Normal  Alignment: Mild retrolisthesis L3-4 with progression from the prior study. Otherwise normal alignment.  Vertebrae: Negative for fracture or mass. Progressive discogenic edema at L4-5. Progressive mild discogenic edema at L3-4 on the right.  Conus medullaris and cauda equina: Conus extends to the L1 level. Conus and cauda equina appear normal.  Paraspinal and other soft tissues: Negative for paraspinous mass, adenopathy, soft tissue edema  Disc levels:  T12-L1: Mild facet  degeneration.  Negative for stenosis  L1-2: Shallow central disc protrusion and mild facet degeneration. Negative for stenosis. No interval change.  L2-3: Disc degeneration with disc bulging and diffuse endplate spurring. Mild facet degeneration. Mild subarticular stenosis bilaterally. Mild spinal stenosis  L3-4: Asymmetric disc degeneration on the right. Diffuse disc bulging and endplate spurring right greater than left. Bilateral facet and ligamentum flavum hypertrophy. Moderate spinal stenosis. Moderate subarticular stenosis bilaterally right greater than left. Progressive stenosis since the prior study  L4-5: Progression of disc degeneration with disc space narrowing and moderate endplate edema. Diffuse endplate spurring. Bilateral facet degeneration. Moderate subarticular and foraminal stenosis on the right. Severe subarticular and foraminal stenosis on the left. Progressive stenosis since the prior study.  L5-S1: Disc degeneration with diffuse endplate spurring. Bilateral facet degeneration. Moderate subarticular and foraminal stenosis on the right. Moderate right subarticular stenosis.  IMPRESSION: Multilevel degenerative change throughout the lumbar spine. There is progression of degenerative change and stenosis compared with the MRI 2019.   Electronically Signed By: Franchot Gallo M.D. On: 12/24/2020 13:46  Complexity Note: Imaging results reviewed. Results shared with Brandi Newton, using Layman's terms.                        ROS  Cardiovascular: No reported cardiovascular signs or symptoms such as High blood pressure, coronary artery disease, abnormal heart rate or rhythm, heart attack, blood thinner therapy or heart weakness and/or failure Pulmonary or Respiratory: Wheezing and difficulty taking a deep full breath (Asthma) Neurological: Curved spine Psychological-Psychiatric: Depressed Gastrointestinal: Irregular, infrequent bowel movements  (Constipation) Genitourinary: Kidney disease Hematological: Brusing easily Endocrine: No reported endocrine signs or symptoms such as high or low blood sugar, rapid heart rate due to high thyroid levels, obesity or weight gain due to slow thyroid or thyroid disease Rheumatologic: Joint aches and or swelling due to excess weight (Osteoarthritis) and Rheumatoid arthritis questionable Musculoskeletal: Negative for myasthenia gravis, muscular dystrophy, multiple sclerosis or malignant hyperthermia Work History: Working part time  Allergies  Brandi Newton is allergic to percocet [oxycodone-acetaminophen] and ace inhibitors.  Laboratory Chemistry Profile   Renal Lab Results  Component Value Date   BUN 27 (H) 05/15/2020   CREATININE 1.53 (H) 05/15/2020   GFRAA 39 (L) 05/15/2020   GFRNONAA 34 (L) 05/15/2020   PROTEINUR NEGATIVE 05/03/2020     Electrolytes Lab Results  Component Value Date   NA 136 05/15/2020   K 4.7 05/15/2020   CL 104 05/15/2020   CALCIUM 9.0 05/15/2020     Hepatic Lab Results  Component Value Date   AST 16 05/03/2020   ALT 17 05/03/2020   ALBUMIN 4.1 05/03/2020   ALKPHOS 56 05/03/2020   LIPASE 34 08/28/2015     ID Lab Results  Component Value Date   SARSCOV2NAA NEGATIVE 05/10/2020   STAPHAUREUS NEGATIVE 05/03/2020   MRSAPCR  NEGATIVE 05/03/2020     Bone No results found for: VD25OH, [id number], CH8527PO2, [id number], 25OHVITD1, 25OHVITD2, 25OHVITD3, TESTOFREE, TESTOSTERONE   Endocrine Lab Results  Component Value Date   GLUCOSE 136 (H) 05/15/2020   GLUCOSEU NEGATIVE 05/03/2020     Neuropathy No results found for: VITAMINB12, FOLATE, HGBA1C, HIV   CNS No results found for: COLORCSF, APPEARCSF, RBCCOUNTCSF, WBCCSF, POLYSCSF, LYMPHSCSF, EOSCSF, PROTEINCSF, GLUCCSF, JCVIRUS, CSFOLI, IGGCSF, LABACHR, ACETBL, LABACHR, ACETBL   Inflammation (CRP: Acute  ESR: Chronic) No results found for: CRP, ESRSEDRATE, LATICACIDVEN   Rheumatology No results found  for: RF, ANA, LABURIC, URICUR, LYMEIGGIGMAB, LYMEABIGMQN, HLAB27   Coagulation Lab Results  Component Value Date   INR 0.99 05/03/2018   LABPROT 13.0 05/03/2018   APTT 28 05/03/2018   PLT 280 05/15/2020     Cardiovascular Lab Results  Component Value Date   HGB 10.3 (L) 05/15/2020   HCT 31.5 (L) 05/15/2020     Screening Lab Results  Component Value Date   SARSCOV2NAA NEGATIVE 05/10/2020   COVIDSOURCE NASOPHARYNGEAL 04/24/2019   STAPHAUREUS NEGATIVE 05/03/2020   MRSAPCR NEGATIVE 05/03/2020     Cancer Lab Results  Component Value Date   CA125 29.1 09/18/2015     Allergens No results found for: ALMOND, APPLE, ASPARAGUS, AVOCADO, BANANA, BARLEY, BASIL, BAYLEAF, GREENBEAN, LIMABEAN, WHITEBEAN, BEEFIGE, REDBEET, BLUEBERRY, BROCCOLI, CABBAGE, MELON, CARROT, CASEIN, CASHEWNUT, CAULIFLOWER, CELERY     Note: Lab results reviewed.  PFSH  Drug: Brandi Newton  reports no history of drug use. Alcohol:  reports current alcohol use. Tobacco:  reports that she quit smoking about 42 years ago. Her smoking use included cigarettes. She has a 10.00 pack-year smoking history. She has never used smokeless tobacco. Medical:  has a past medical history of Anemia, Arthritis, Asthma, Bilateral ovarian cysts, Bronchitis, Constipation, Family history of adverse reaction to anesthesia, GERD (gastroesophageal reflux disease), H/O seasonal allergies, History of kidney stones, Hypertension, PONV (postoperative nausea and vomiting), and Sinus problem. Family: family history includes Heart disease in her father and mother; Skin cancer in her father.  Past Surgical History:  Procedure Laterality Date  . ANTERIOR CERVICAL DECOMP/DISCECTOMY FUSION N/A 05/11/2018   Procedure: ANTERIOR CERVICAL DECOMPRESSION/DISCECTOMY FUSION 3 LEVELS-C4-7;  Surgeon: Meade Maw, MD;  Location: [facility];  Service: Neurosurgery;  Laterality: N/A;  . back Bilateral    cervical disc  . BACK SURGERY  87 or 88   X2  . BREAST  EXCISIONAL BIOPSY Right 1975?   benign  . CATARACT EXTRACTION    . LAPAROSCOPIC HYSTERECTOMY Bilateral 10/03/2015   Procedure: HYSTERECTOMY TOTAL LAPAROSCOPIC, BILATERAL SALPINGOOPHERECTOMY;  Surgeon: Honor Loh Ward, MD;  Location: [facility];  Service: Gynecology;  Laterality: Bilateral;  . nodules vocal cords    . TOTAL KNEE ARTHROPLASTY Right 04/27/2019   Procedure: TOTAL KNEE ARTHROPLASTY - RIGHT;  Surgeon: Corky Mull, MD;  Location: [facility];  Service: Orthopedics;  Laterality: Right;  . TOTAL KNEE ARTHROPLASTY Left 05/14/2020   Procedure: TOTAL KNEE ARTHROPLASTY;  Surgeon: Corky Mull, MD;  Location: [facility];  Service: Orthopedics;  Laterality: Left;  . TUBAL LIGATION  1984   Active Ambulatory Problems    Diagnosis Date Noted  . Cough 02/14/2013  . Allergic rhinitis 02/14/2013  . GERD (gastroesophageal reflux disease) 02/14/2013  . 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 S/P total hysterectomy and bilateral salpingo-oophorectomy 10/03/2015  . Primary osteoarthritis of both knees 01/24/2015  . Lumbar radiculopathy 01/26/2018  . Bilateral carpal tunnel syndrome 01/24/2018  . Cervical radiculopathy 05/11/2018  . 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 Status post total knee replacement using cement, left 05/14/2020  . Cellulitis of left knee 06/28/2020  . Edema of right lower leg 05/03/2020  . Primary osteoarthritis of left knee 01/24/2015  . Primary osteoarthritis of right knee 05/03/2020  . Cervical myelopathy ([place]) 04/21/2018  . 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 Chronic pain syndrome 03/17/2021  . Pharmacologic therapy 03/17/2021  . Disorder of skeletal system 03/17/2021  . Problems influencing health status 03/17/2021  . Abnormal MRI, cervical spine (10/19/2018) 03/17/2021  . Abnormal MRI, lumbar spine (12/24/2020) 03/17/2021  . Abnormal MRI, thoracic spine (12/24/2020) 03/17/2021  . Chronic low back pain (1ry area of Pain) (Bilateral)  (R>L) w/o sciatica 03/19/2021  . 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 Chronic upper back pain (2ry area of Pain) (Bilateral) (R>L) 03/19/2021  . Chronic lower extremity pain (3ry area of Pain) (Bilateral) 03/19/2021  . Lumbar facet syndrome (Bilateral) 03/19/2021  . 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 Chronic sacroiliac joint pain (Bilateral) (L>R) 03/19/2021   Resolved Ambulatory Problems    Diagnosis Date Noted  . No Resolved Ambulatory Problems   Past Medical History:  Diagnosis Date  . Anemia   . Arthritis   . Asthma   . Bilateral ovarian cysts   . Bronchitis   . Constipation   . Family history of adverse reaction to anesthesia   . H/O seasonal allergies   . History of kidney stones   . Hypertension   . PONV (postoperative nausea and vomiting)   . Sinus problem    Constitutional Exam  General appearance: Well nourished, well developed, and well hydrated. In no apparent acute distress Vitals:   03/19/21 1120  BP: 138/70  Pulse: 74  Resp: 18  Temp: (!) 97 F (36.1 C)  SpO2: 98%  Weight: 205 lb (93 kg)  Height: '5\' 7"'  (1.702 m)   BMI Assessment: Estimated body mass index is 32.11 kg/m as calculated from the following:   Height as of this encounter: '5\' 7"'  (1.702 m).   Weight as of this encounter: 205 lb (93 kg).  BMI interpretation table: BMI level Category Range association with higher incidence of chronic pain  <18 kg/m2 Underweight   18.5-24.9 kg/m2 Ideal body weight   25-29.9 kg/m2 Overweight Increased incidence by 20%  30-34.9 kg/m2 Obese (Class I) Increased incidence by 68%  35-39.9 kg/m2 Severe obesity (Class II) Increased incidence by 136%  >40 kg/m2 Extreme obesity (Class III) Increased incidence by 254%   Patient's current BMI Ideal Body weight  Body mass index is 32.11 kg/m. Ideal body weight: 61.6 kg (135 lb 12.9 oz) Adjusted ideal body weight: 74.2 kg (163  lb 7.7 oz)   BMI Readings from Last 4 Encounters:  03/19/21 32.11 kg/m  05/14/20 31.48 kg/m  04/27/19 32.89 kg/m  04/24/19 32.83 kg/m   Wt Readings from Last 4 Encounters:  03/19/21 205 lb (93 kg)  05/14/20 201 lb (91.2 kg)  04/27/19 210 lb (95.3 kg)  04/24/19 209 lb 9.6 oz (95.1 kg)    Psych/Mental status: Alert, oriented x 3 (person, place, & time)       Eyes: PERLA Respiratory: No evidence of acute respiratory distress   Assessment  Primary Diagnosis & Pertinent Problem List: The primary encounter diagnosis was Chronic low back pain (1ry area of Pain) (Bilateral) (R>L) w/o sciatica. 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  Visit Diagnosis (New problems to examiner): 1. Chronic low back pain (1ry area of Pain) (Bilateral) (R>L) w/o sciatica   2. 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 Lumbar facet syndrome (Bilateral)   4. Chronic sacroiliac joint pain (Bilateral) (L>R)   5. Chronic upper back pain (2ry area of Pain) (Bilateral) (R>L)   6. Chronic lower extremity pain (3ry area of Pain) (Bilateral)   7. 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 Abnormal MRI, lumbar spine (12/24/2020)   9. Abnormal MRI, thoracic spine (12/24/2020)   10. Abnormal MRI, cervical spine (10/19/2018)   11. Chronic pain syndrome   12. Pharmacologic  therapy   13. Disorder of skeletal system   14. Problems influencing health status    Plan of Care (Initial workup plan)  Note: Brandi Newton was reminded that as per protocol, today's visit has been an evaluation only. We have not taken over the patient's controlled substance management.  Problem-specific plan: No problem-specific Assessment & Plan notes found for this encounter.   Lab Orders     Compliance Drug Analysis, Ur     Comp. Metabolic Panel (12)     Magnesium     Vitamin B12     Sedimentation rate     25-Hydroxy vitamin D Lcms D2+D3     C-reactive protein  Imaging Orders     DG Cervical Spine With Flex & Extend     DG Lumbar Spine Complete W/Bend     DG Thoracic Spine 2 View     DG Si Joints     DG Knee 1-2 Views Right Referral Orders  No referral(s) requested today    Procedure Orders     TRIGGER POINT INJECTION Pharmacotherapy (current): Medications ordered:  No orders of the defined types were placed in this encounter.  Medications administered during this visit: Brittish Bolinger. Newton had no medications administered during this visit.   Pharmacological management options:  Opioid Analgesics: The patient was informed that there is no guarantee that she would be a candidate for opioid analgesics. The decision will be made following CDC guidelines. This decision will be based on the results of diagnostic studies, as well as Brandi Newton's risk profile.   Membrane stabilizer: To be determined at a later time  Muscle relaxant: To be determined at a later time  NSAID: To be determined at a later time  Other analgesic(s): To be determined at a later time   Interventional management options: Brandi Newton was informed that there is no guarantee that she would be a candidate for interventional therapies. The decision will be based on the results of diagnostic studies, as well as Brandi Newton's risk profile.  Procedure(s) under consideration:  Diagnostic/therapeutic Lumbar (PSIS)  trigger point injections    Provider-requested follow-up: Return for (F2F), (40 min)(2V), (s/p Tests) + (R) TPI #1.  No future appointments.  Note by: Gaspar Cola, MD Date: 03/19/2021; Time: 12:40 PM

## 2021-03-19 ENCOUNTER — Ambulatory Visit
Admission: RE | Admit: 2021-03-19 | Discharge: 2021-03-19 | Disposition: A | Discharge status: Home / Self Care | Payer: Medicare PPO | Source: Ambulatory Visit | Attending: Pain Medicine | Admitting: Pain Medicine

## 2021-03-19 ENCOUNTER — Encounter: Payer: Self-pay | Admitting: Pain Medicine

## 2021-03-19 ENCOUNTER — Ambulatory Visit: Payer: Medicare PPO | Attending: Pain Medicine | Admitting: Pain Medicine

## 2021-03-19 ENCOUNTER — Other Ambulatory Visit: Payer: Self-pay

## 2021-03-19 VITALS — BP 138/70 | HR 74 | Temp 97.0°F | Resp 18 | Ht 67.0 in | Wt 205.0 lb

## 2021-03-19 DIAGNOSIS — M79604 Pain in right leg: Secondary | ICD-10-CM | POA: Insufficient documentation

## 2021-03-19 DIAGNOSIS — M545 Low back pain, unspecified: Secondary | ICD-10-CM | POA: Diagnosis not present

## 2021-03-19 DIAGNOSIS — M25562 Pain in left knee: Secondary | ICD-10-CM | POA: Diagnosis present

## 2021-03-19 DIAGNOSIS — M79605 Pain in left leg: Secondary | ICD-10-CM

## 2021-03-19 DIAGNOSIS — Z789 Other specified health status: Secondary | ICD-10-CM

## 2021-03-19 DIAGNOSIS — Z96653 Presence of artificial knee joint, bilateral: Secondary | ICD-10-CM | POA: Insufficient documentation

## 2021-03-19 DIAGNOSIS — M47816 Spondylosis without myelopathy or radiculopathy, lumbar region: Secondary | ICD-10-CM | POA: Insufficient documentation

## 2021-03-19 DIAGNOSIS — G8929 Other chronic pain: Secondary | ICD-10-CM | POA: Insufficient documentation

## 2021-03-19 DIAGNOSIS — M25561 Pain in right knee: Secondary | ICD-10-CM

## 2021-03-19 DIAGNOSIS — M549 Dorsalgia, unspecified: Secondary | ICD-10-CM

## 2021-03-19 DIAGNOSIS — M7918 Myalgia, other site: Secondary | ICD-10-CM

## 2021-03-19 DIAGNOSIS — G8928 Other chronic postprocedural pain: Secondary | ICD-10-CM

## 2021-03-19 DIAGNOSIS — G894 Chronic pain syndrome: Secondary | ICD-10-CM

## 2021-03-19 DIAGNOSIS — M899 Disorder of bone, unspecified: Secondary | ICD-10-CM

## 2021-03-19 DIAGNOSIS — R937 Abnormal findings on diagnostic imaging of other parts of musculoskeletal system: Secondary | ICD-10-CM | POA: Insufficient documentation

## 2021-03-19 DIAGNOSIS — M533 Sacrococcygeal disorders, not elsewhere classified: Secondary | ICD-10-CM | POA: Insufficient documentation

## 2021-03-19 DIAGNOSIS — R94130 Abnormal response to nerve stimulation, unspecified: Secondary | ICD-10-CM

## 2021-03-19 DIAGNOSIS — Z79899 Other long term (current) drug therapy: Secondary | ICD-10-CM

## 2021-03-19 DIAGNOSIS — R94131 Abnormal electromyogram [EMG]: Secondary | ICD-10-CM

## 2021-03-19 NOTE — Progress Notes (Signed)
Safety precautions to be maintained throughout the outpatient stay will include: orient to surroundings, keep bed in low position, maintain call bell within reach at all times, provide assistance with transfer out of bed and ambulation.  

## 2021-03-20 ENCOUNTER — Encounter: Payer: Self-pay | Admitting: Pain Medicine

## 2021-03-20 ENCOUNTER — Ambulatory Visit: Payer: Medicare PPO | Attending: Pain Medicine | Admitting: Pain Medicine

## 2021-03-20 VITALS — BP 135/63 | HR 69 | Temp 97.2°F | Resp 16 | Ht 67.0 in | Wt 205.0 lb

## 2021-03-20 DIAGNOSIS — R937 Abnormal findings on diagnostic imaging of other parts of musculoskeletal system: Secondary | ICD-10-CM | POA: Insufficient documentation

## 2021-03-20 DIAGNOSIS — M545 Low back pain, unspecified: Secondary | ICD-10-CM | POA: Insufficient documentation

## 2021-03-20 DIAGNOSIS — M7918 Myalgia, other site: Secondary | ICD-10-CM | POA: Diagnosis present

## 2021-03-20 DIAGNOSIS — G8929 Other chronic pain: Secondary | ICD-10-CM | POA: Diagnosis present

## 2021-03-20 MED ORDER — TRIAMCINOLONE ACETONIDE 40 MG/ML IJ SUSP
INTRAMUSCULAR | Status: AC
  Filled 2021-03-20: qty 1

## 2021-03-20 MED ORDER — ROPIVACAINE HCL 2 MG/ML IJ SOLN
9.0000 mL | Freq: Once | INTRAMUSCULAR | Status: AC
Start: 2021-03-20 — End: 2021-03-20
  Administered 2021-03-20: 9 mL

## 2021-03-20 MED ORDER — ROPIVACAINE HCL 2 MG/ML IJ SOLN
INTRAMUSCULAR | Status: AC
  Filled 2021-03-20: qty 10

## 2021-03-20 MED ORDER — LIDOCAINE HCL 2 % IJ SOLN
20.0000 mL | Freq: Once | INTRAMUSCULAR | Status: AC
  Administered 2021-03-20: 400 mg

## 2021-03-20 MED ORDER — LIDOCAINE HCL 2 % IJ SOLN
INTRAMUSCULAR | Status: AC
  Filled 2021-03-20: qty 20

## 2021-03-20 MED ORDER — TRIAMCINOLONE ACETONIDE 40 MG/ML IJ SUSP
40.0000 mg | Freq: Once | INTRAMUSCULAR | Status: AC
  Administered 2021-03-20: 40 mg

## 2021-03-20 NOTE — Progress Notes (Signed)
Safety precautions to be maintained throughout the outpatient stay will include: orient to surroundings, keep bed in low position, maintain call bell within reach at all times, provide assistance with transfer out of bed and ambulation.  

## 2021-03-20 NOTE — Progress Notes (Signed)
PROVIDER NOTE: Information contained herein reflects review and annotations entered in association with encounter. Interpretation of such information and data should be left to medically-trained personnel. Information provided to patient can be located elsewhere in the medical record under "Patient Instructions". Document created using STT-dictation technology, any transcriptional errors that may result from process are unintentional.    Patient: Brandi Newton  Service Category: Procedure  Provider: Gaspar Cola, MD  DOB: 1946-07-03  DOS: 03/20/2021  Location: [facility]  MRN: [id number]  Setting: Ambulatory - outpatient  Referring Provider: Ranae Plumber, PA  Type: Established Patient  Specialty: Interventional Pain Management  PCP: Ranae Plumber, [place]   Primary Reason for Visit: Interventional Pain Management Treatment. CC: No chief complaint on file.  Procedure:          Anesthesia, Analgesia, Anxiolysis:  Type: PSIS/paravertebral muscle Trigger Point Injection (1-2 muscle groups) #1  CPT: 20552 Primary Purpose: Diagnostic Region: Lower Lumbosacral Level: Sacral Target Area: PSIS/paravertebral muscles Trigger Point Approach: Percutaneous, ipsilateral approach. Laterality: Bilateral Paraspinal  Type: Local Anesthesia Indication(s): Analgesia         Local Anesthetic: Lidocaine 1-2% Route: Infiltration ([place]/IM) IV Access: Declined Sedation: Declined   Position: Prone   Indications: 1. Chronic low back pain (1ry area of Pain) (Bilateral) (R>L) w/o sciatica   2. 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 Abnormal MRI, lumbar spine (12/24/2020)   4. Abnormal MRI, thoracic spine (12/24/2020)    Pain Score: Pre-procedure: 5 /10 Post-procedure: 4 /10   Pre-op H&P Assessment:  Brandi Newton is a 75 y.o. (year old), female patient, seen today for interventional treatment. 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 Brandi Newton has a current medication list which includes the following prescription(s): acetaminophen, albuterol, atorvastatin, benzonatate, cetirizine, cholecalciferol, cyanocobalamin, escitalopram, fluticasone, hydrochlorothiazide, losartan, polyethylene glycol, tiotropium, and gabapentin. Her primarily concern today is the No chief complaint on file.  Initial Vital Signs:  Pulse/HCG Rate: 84  Temp: (!) 97.2 F (36.2 C) Resp: 16 BP: (!) 154/80 SpO2: 97 %  BMI: Estimated body mass index is 32.11 kg/m as calculated from the following:   Height as of this encounter: 5\' 7"  (1.702 m).   Weight as of this encounter: 205 lb (93 kg).  Risk Assessment: Allergies: Reviewed. She is allergic to percocet [oxycodone-acetaminophen] and ace inhibitors.  Allergy Precautions: None required Coagulopathies: Reviewed. None identified.  Blood-thinner therapy: None at this time Active Infection(s): Reviewed. None identified. Brandi Newton is afebrile  Site Confirmation: Brandi Newton was asked to confirm the procedure and laterality before marking the site Procedure checklist: Completed Consent: Before the procedure and under the influence of no sedative(s), amnesic(s), or anxiolytics, the patient was informed of the treatment options, risks and possible complications. To fulfill our ethical and legal obligations, as recommended by the American Medical Association's Code of Ethics, I have informed the patient of my clinical impression; the nature and purpose of the treatment or procedure; the risks, benefits, and possible complications of the intervention; the alternatives, including doing nothing; the  risk(s) and benefit(s) of the alternative treatment(s) or procedure(s); and the risk(s) and benefit(s) of doing nothing. The patient was provided information about the general risks and possible complications associated with the  procedure. These may include, but are not limited to: failure to achieve desired goals, infection, bleeding, organ or nerve damage, allergic reactions, paralysis, and death. In addition, the patient was informed of those risks and complications associated to the procedure, such as failure to decrease pain; infection; bleeding; organ or nerve damage with subsequent damage to sensory, motor, and/or autonomic systems, resulting in permanent pain, numbness, and/or weakness of one or several areas of the body; allergic reactions; (i.e.: anaphylactic reaction); and/or death. Furthermore, the patient was informed of those risks and complications associated with the medications. These include, but are not limited to: allergic reactions (i.e.: anaphylactic or anaphylactoid reaction(s)); adrenal axis suppression; blood sugar elevation that in diabetics may result in ketoacidosis or comma; water retention that in patients with history of congestive heart failure may result in shortness of breath, pulmonary edema, and decompensation with resultant heart failure; weight gain; swelling or edema; medication-induced neural toxicity; particulate matter embolism and blood vessel occlusion with resultant organ, and/or nervous system infarction; and/or aseptic necrosis of one or more joints. Finally, the patient was informed that Medicine is not an exact science; therefore, there is also the possibility of unforeseen or unpredictable risks and/or possible complications that may result in a catastrophic outcome. The patient indicated having understood very clearly. We have given the patient no guarantees and we have made no promises. Enough time was given to the patient to ask questions, all of which were  answered to the patient's satisfaction. Brandi Newton has indicated that she wanted to continue with the procedure. Attestation: I, the ordering provider, attest that I have discussed with the patient the benefits, risks, side-effects, alternatives, likelihood of achieving goals, and potential problems during recovery for the procedure that I have provided informed consent. Date  Time: 03/20/2021 11:01 AM  Pre-Procedure Preparation:  Monitoring: As per clinic protocol. Respiration, ETCO2, SpO2, BP, heart rate and rhythm monitor placed and checked for adequate function Safety Precautions: Patient was assessed for positional comfort and pressure points before starting the procedure. Time-out: I initiated and conducted the "Time-out" before starting the procedure, as per protocol. The patient was asked to participate by confirming the accuracy of the "Time Out" information. Verification of the correct person, site, and procedure were performed and confirmed by me, the nursing staff, and the patient. "Time-out" conducted as per Joint Commission's Universal Protocol (UP.01.01.01). Time: 1130  Description of Procedure:          Area Prepped: Entire             Region DuraPrep (Iodine Povacrylex [0.7% available iodine] and Isopropyl Alcohol, 74% w/w) Safety Precautions: Aspiration looking for blood return was conducted prior to all injections. At no point did we inject any substances, as a needle was being advanced. No attempts were made at seeking any paresthesias. Safe injection practices and needle disposal techniques used. Medications properly checked for expiration dates. SDV (single dose vial) medications used. Description of the Procedure: Protocol guidelines were followed. The patient was placed in position over the fluoroscopy table. The target area was identified and the area prepped in the usual manner. Skin & deeper tissues infiltrated with local anesthetic. Appropriate amount of time allowed to pass  for local anesthetics to take effect. The procedure needles were then advanced to the target area. Proper needle placement secured. Negative aspiration confirmed. Solution injected in intermittent fashion, asking for systemic symptoms every 0.5cc of injectate. The needles were then removed and the area cleansed, making sure to leave some of  the prepping solution back to take advantage of its long term bactericidal properties.  Vitals:   03/20/21 1100 03/20/21 1141  BP: (!) 154/80 135/63  Pulse: 84 69  Resp: 16 16  Temp: (!) 97.2 F (36.2 C)   SpO2: 97% 95%  Weight: 205 lb (93 kg)   Height: 5\' 7"  (1.702 m)     Start Time: 1130 hrs. End Time:   hrs. Materials:  Needle(s) Type: Epidural needle Gauge: 20G Length: 3.5-in Medication(s): Please see orders for medications and dosing details.  Imaging Guidance:          Type of Imaging Technique: None used Indication(s): N/A Exposure Time: No patient exposure Contrast: None used. Fluoroscopic Guidance: N/A Ultrasound Guidance: N/A Interpretation: N/A  Antibiotic Prophylaxis:   Anti-infectives (From admission, onward)   None     Indication(s): None identified  Post-operative Assessment:  Post-procedure Vital Signs:  Pulse/HCG Rate: 69  Temp: (!) 97.2 F (36.2 C) Resp: 16 BP: 135/63 SpO2: 95 %  EBL: None  Complications: No immediate post-treatment complications observed by team, or reported by patient.  Note: The patient tolerated the entire procedure well. A repeat set of vitals were taken after the procedure and the patient was kept under observation following institutional policy, for this type of procedure. Post-procedural neurological assessment was performed, showing return to baseline, prior to discharge. The patient was provided with post-procedure discharge instructions, including a section on how to identify potential problems. Should any problems arise concerning this procedure, the patient was given instructions to  immediately contact us, at any time, without hesitation. In any case, we plan to contact the patient by telephone for a follow-up status report regarding this interventional procedure.  Comments:  No additional relevant information.  Plan of Care  Orders:  Orders Placed This Encounter  Procedures  . TRIGGER POINT INJECTION    Scheduling Instructions:     Area: Lower Back     Side: Bilateral     Sedation: No Sedation.     Timeframe: Today    Order Specific Question:   Where will this procedure be performed?    Answer:   [facility]  . Informed Consent Details: Physician/Practitioner Attestation; Transcribe to consent form and obtain patient signature    Provider Attestation: I, Pelham Dossie Arbour, MD, (Pain Management Specialist), the physician/practitioner, attest that I have discussed with the patient the benefits, risks, side effects, alternatives, likelihood of achieving goals and potential problems during recovery for the procedure that I have provided informed consent.    Scheduling Instructions:     Note: Always confirm laterality of pain with Ms. Dula, before procedure.     Transcribe to consent form and obtain patient signature.    Order Specific Question:   Physician/Practitioner attestation of informed consent for procedure/surgical case    Answer:   I, the physician/practitioner, attest that I have discussed with the patient the benefits, risks, side effects, alternatives, likelihood of achieving goals and potential problems during recovery for the procedure that I have provided informed consent.    Order Specific Question:   Procedure    Answer:   Myoneural Block (Trigger Point injection)    Order Specific Question:   Physician/Practitioner performing the procedure    Answer:   Rhylen Shaheen A. Dossie Arbour MD    Order Specific Question:   Indication/Reason    Answer:   Musculoskeletal pain/myofascial pain secondary to trigger point  . Provide equipment / supplies at  bedside    "Block Tray" (  Disposable  single use) Needle type: SpinalRegular Amount/quantity: 1 Size: Short(1.5-inch) Gauge: 25G    Standing Status:   Standing    Number of Occurrences:   1    Order Specific Question:   Specify    Answer:   Block Tray   Chronic Opioid Analgesic:  None MME/day: 0 mg/day   Medications ordered for procedure: Meds ordered this encounter  Medications  . lidocaine (XYLOCAINE) 2 % (with pres) injection 400 mg  . ropivacaine (PF) 2 mg/mL (0.2%) (NAROPIN) injection 9 mL  . triamcinolone acetonide (KENALOG-40) injection 40 mg   Medications administered: We administered lidocaine, ropivacaine (PF) 2 mg/mL (0.2%), and triamcinolone acetonide.  See the medical record for exact dosing, route, and time of administration.  Follow-up plan:   Return in about 2 weeks (around 04/03/2021) for (F2F), (40 min)(2V), (s/p Tests), (PPE).      Interventional Therapies  Risk  Complexity Considerations:   Estimated body mass index is 32.11 kg/m as calculated from the following:   Height as of this encounter: 5\' 7"  (1.702 m).   Weight as of this encounter: 205 lb (93 kg). WNL   Planned  Pending:   Pending further evaluation   Under consideration:   Diagnostic/therapeutic Lumbar (PSIS) trigger point injections    Completed:   None at this time   Therapeutic  Palliative (PRN) options:   None established    Recent Visits Date Type Provider Dept  03/19/21 Office Visit Milinda Pointer, MD [facility]  Showing recent visits within past 90 days and meeting all other requirements Today's Visits Date Type Provider Dept  03/20/21 Procedure visit Milinda Pointer, MD [facility]  Showing today's visits and meeting all other requirements Future Appointments Date Type Provider Dept  04/08/21 Appointment Milinda Pointer, MD [facility]  Showing future appointments within next 90 days and meeting all other  requirements  Disposition: Discharge home  Discharge (Date  Time): 03/20/2021; 1141 hrs.   Primary Care Physician: Ranae Plumber, PA Location: [facility] Note by: Gaspar Cola, MD Date: 03/20/2021; Time: 11:48 AM  Disclaimer:  Medicine is not an Chief Strategy Officer. The only guarantee in medicine is that nothing is guaranteed. It is important to note that the decision to proceed with this intervention was based on the information collected from the patient. The Data and conclusions were drawn from the patient's questionnaire, the interview, and the physical examination. Because the information was provided in large part by the patient, it cannot be guaranteed that it has not been purposely or unconsciously manipulated. Every effort has been made to obtain as much relevant data as possible for this evaluation. It is important to note that the conclusions that lead to this procedure are derived in large part from the available data. Always take into account that the treatment will also be dependent on availability of resources and existing treatment guidelines, considered by other Pain Management Practitioners as being common knowledge and practice, at the time of the intervention. For Medico-Legal purposes, it is also important to point out that variation in procedural techniques and pharmacological choices are the acceptable norm. The indications, contraindications, technique, and results of the above procedure should only be interpreted and judged by a Board-Certified Interventional Pain Specialist with extensive familiarity and expertise in the same exact procedure and technique.

## 2021-03-20 NOTE — Patient Instructions (Addendum)

## 2021-03-21 ENCOUNTER — Telehealth: Payer: Self-pay | Admitting: *Deleted

## 2021-03-21 NOTE — Telephone Encounter (Signed)
No problems post procedure. 

## 2021-03-25 LAB — COMPLIANCE DRUG ANALYSIS, UR

## 2021-04-02 LAB — COMP. METABOLIC PANEL (12)
AST: 16 IU/L (ref 0–40)
Albumin/Globulin Ratio: 1.7 (ref 1.2–2.2)
Albumin: 4.2 g/dL (ref 3.7–4.7)
Alkaline Phosphatase: 96 IU/L (ref 44–121)
BUN/Creatinine Ratio: 16 (ref 12–28)
BUN: 23 mg/dL (ref 8–27)
Bilirubin Total: 0.3 mg/dL (ref 0.0–1.2)
Calcium: 10.2 mg/dL (ref 8.7–10.3)
Chloride: 102 mmol/L (ref 96–106)
Creatinine, Ser: 1.4 mg/dL — ABNORMAL HIGH (ref 0.57–1.00)
Globulin, Total: 2.5 g/dL (ref 1.5–4.5)
Glucose: 83 mg/dL (ref 65–99)
Potassium: 3.9 mmol/L (ref 3.5–5.2)
Sodium: 141 mmol/L (ref 134–144)
Total Protein: 6.7 g/dL (ref 6.0–8.5)
eGFR: 40 mL/min/{1.73_m2} — ABNORMAL LOW (ref >59)

## 2021-04-02 LAB — MAGNESIUM: Magnesium: 1.9 mg/dL (ref 1.6–2.3)

## 2021-04-02 LAB — 25-HYDROXY VITAMIN D LCMS D2+D3
25-Hydroxy, Vitamin D-2: <1 ng/mL
25-Hydroxy, Vitamin D-3: 55 ng/mL
25-Hydroxy, Vitamin D: 55 ng/mL

## 2021-04-02 LAB — SEDIMENTATION RATE: Sed Rate: 24 mm/hr (ref 0–40)

## 2021-04-02 LAB — VITAMIN B12: Vitamin B-12: 758 pg/mL (ref 232–1245)

## 2021-04-02 LAB — C-REACTIVE PROTEIN: CRP: 18 mg/L — ABNORMAL HIGH (ref 0–10)

## 2021-04-08 ENCOUNTER — Other Ambulatory Visit: Payer: Self-pay

## 2021-04-08 ENCOUNTER — Encounter: Payer: Self-pay | Admitting: Pain Medicine

## 2021-04-08 ENCOUNTER — Ambulatory Visit: Payer: Medicare PPO | Attending: Pain Medicine | Admitting: Pain Medicine

## 2021-04-08 VITALS — BP 143/79 | HR 72 | Temp 97.0°F | Resp 18 | Ht 67.0 in | Wt 205.0 lb

## 2021-04-08 DIAGNOSIS — M47816 Spondylosis without myelopathy or radiculopathy, lumbar region: Secondary | ICD-10-CM | POA: Diagnosis present

## 2021-04-08 DIAGNOSIS — M47817 Spondylosis without myelopathy or radiculopathy, lumbosacral region: Secondary | ICD-10-CM | POA: Diagnosis not present

## 2021-04-08 DIAGNOSIS — G8929 Other chronic pain: Secondary | ICD-10-CM | POA: Diagnosis present

## 2021-04-08 DIAGNOSIS — M533 Sacrococcygeal disorders, not elsewhere classified: Secondary | ICD-10-CM | POA: Insufficient documentation

## 2021-04-08 DIAGNOSIS — M4310 Spondylolisthesis, site unspecified: Secondary | ICD-10-CM | POA: Diagnosis present

## 2021-04-08 DIAGNOSIS — R937 Abnormal findings on diagnostic imaging of other parts of musculoskeletal system: Secondary | ICD-10-CM | POA: Diagnosis present

## 2021-04-08 DIAGNOSIS — M545 Low back pain, unspecified: Secondary | ICD-10-CM | POA: Insufficient documentation

## 2021-04-08 DIAGNOSIS — M779 Enthesopathy, unspecified: Secondary | ICD-10-CM | POA: Insufficient documentation

## 2021-04-08 DIAGNOSIS — Z96653 Presence of artificial knee joint, bilateral: Secondary | ICD-10-CM | POA: Insufficient documentation

## 2021-04-08 DIAGNOSIS — M48061 Spinal stenosis, lumbar region without neurogenic claudication: Secondary | ICD-10-CM | POA: Insufficient documentation

## 2021-04-08 DIAGNOSIS — M4807 Spinal stenosis, lumbosacral region: Secondary | ICD-10-CM | POA: Insufficient documentation

## 2021-04-08 NOTE — Patient Instructions (Addendum)
____________________________________________________________________________________________  Preparing for Procedure with Sedation  Procedure appointments are limited to planned procedures: . No Prescription Refills. . No disability issues will be discussed. . No medication changes will be discussed.  Instructions: . Oral Intake: Do not eat or drink anything for at least 8 hours prior to your procedure. (Exception: Blood Pressure Medication. See below.) . Transportation: Unless otherwise stated by your physician, you may drive yourself after the procedure. . Blood Pressure Medicine: Do not forget to take your blood pressure medicine with a sip of water the morning of the procedure. If your Diastolic (lower reading)is above 100 mmHg, elective cases will be cancelled/rescheduled. . Blood thinners: These will need to be stopped for procedures. Notify our staff if you are taking any blood thinners. Depending on which one you take, there will be specific instructions on how and when to stop it. . Diabetics on insulin: Notify the staff so that you can be scheduled 1st case in the morning. If your diabetes requires high dose insulin, take only  of your normal insulin dose the morning of the procedure and notify the staff that you have done so. . Preventing infections: Shower with an antibacterial soap the morning of your procedure. . Build-up your immune system: Take 1000 mg of Vitamin C with every meal (3 times a day) the day prior to your procedure. . Antibiotics: Inform the staff if you have a condition or reason that requires you to take antibiotics before dental procedures. . Pregnancy: If you are pregnant, call and cancel the procedure. . Sickness: If you have a cold, fever, or any active infections, call and cancel the procedure. . Arrival: You must be in the facility at least 30 minutes prior to your scheduled procedure. . Children: Do not bring children with you. . Dress appropriately:  Bring dark clothing that you would not mind if they get stained. . Valuables: Do not bring any jewelry or valuables.  Reasons to call and reschedule or cancel your procedure: (Following these recommendations will minimize the risk of a serious complication.) . Surgeries: Avoid having procedures within 2 weeks of any surgery. (Avoid for 2 weeks before or after any surgery). . Flu Shots: Avoid having procedures within 2 weeks of a flu shots or . (Avoid for 2 weeks before or after immunizations). . Barium: Avoid having a procedure within 7-10 days after having had a radiological study involving the use of radiological contrast. (Myelograms, Barium swallow or enema study). . Heart attacks: Avoid any elective procedures or surgeries for the initial 6 months after a "Myocardial Infarction" (Heart Attack). . Blood thinners: It is imperative that you stop these medications before procedures. Let us know if you if you take any blood thinner.  . Infection: Avoid procedures during or within two weeks of an infection (including chest colds or gastrointestinal problems). Symptoms associated with infections include: Localized redness, fever, chills, night sweats or profuse sweating, burning sensation when voiding, cough, congestion, stuffiness, runny nose, sore throat, diarrhea, nausea, vomiting, cold or Flu symptoms, recent or current infections. It is specially important if the infection is over the area that we intend to treat. . Heart and lung problems: Symptoms that may suggest an active cardiopulmonary problem include: cough, chest pain, breathing difficulties or shortness of breath, dizziness, ankle swelling, uncontrolled high or unusually low blood pressure, and/or palpitations. If you are experiencing any of these symptoms, cancel your procedure and contact your primary care physician for an evaluation.  Remember:  Regular Business hours are:    Monday to Thursday 8:00 AM to 4:00 PM  Provider's  Schedule: Karandeep Resende, MD:  Procedure days: Tuesday and Thursday 7:30 AM to 4:00 PM  Bilal Lateef, MD:  Procedure days: Monday and Wednesday 7:30 AM to 4:00 PM ____________________________________________________________________________________________   ____________________________________________________________________________________________  General Risks and Possible Complications  Patient Responsibilities: It is important that you read this as it is part of your informed consent. It is our duty to inform you of the risks and possible complications associated with treatments offered to you. It is your responsibility as a patient to read this and to ask questions about anything that is not clear or that you believe was not covered in this document.  Patient's Rights: You have the right to refuse treatment. You also have the right to change your mind, even after initially having agreed to have the treatment done. However, under this last option, if you wait until the last second to change your mind, you may be charged for the materials used up to that point.  Introduction: Medicine is not an exact science. Everything in Medicine, including the lack of treatment(s), carries the potential for danger, harm, or loss (which is by definition: Risk). In Medicine, a complication is a secondary problem, condition, or disease that can aggravate an already existing one. All treatments carry the risk of possible complications. The fact that a side effects or complications occurs, does not imply that the treatment was conducted incorrectly. It must be clearly understood that these can happen even when everything is done following the highest safety standards.  No treatment: You can choose not to proceed with the proposed treatment alternative. The "PRO(s)" would include: avoiding the risk of complications associated with the therapy. The "CON(s)" would include: not getting any of the treatment  benefits. These benefits fall under one of three categories: diagnostic; therapeutic; and/or palliative. Diagnostic benefits include: getting information which can ultimately lead to improvement of the disease or symptom(s). Therapeutic benefits are those associated with the successful treatment of the disease. Finally, palliative benefits are those related to the decrease of the primary symptoms, without necessarily curing the condition (example: decreasing the pain from a flare-up of a chronic condition, such as incurable terminal cancer).  General Risks and Complications: These are associated to most interventional treatments. They can occur alone, or in combination. They fall under one of the following six (6) categories: no benefit or worsening of symptoms; bleeding; infection; nerve damage; allergic reactions; and/or death. 1. No benefits or worsening of symptoms: In Medicine there are no guarantees, only probabilities. No healthcare provider can ever guarantee that a medical treatment will work, they can only state the probability that it may. Furthermore, there is always the possibility that the condition may worsen, either directly, or indirectly, as a consequence of the treatment. 2. Bleeding: This is more common if the patient is taking a blood thinner, either prescription or over the counter (example: Goody Powders, Fish oil, Aspirin, Garlic, etc.), or if suffering a condition associated with impaired coagulation (example: Hemophilia, cirrhosis of the liver, low platelet counts, etc.). However, even if you do not have one on these, it can still happen. If you have any of these conditions, or take one of these drugs, make sure to notify your treating physician. 3. Infection: This is more common in patients with a compromised immune system, either due to disease (example: diabetes, cancer, human immunodeficiency virus [HIV], etc.), or due to medications or treatments (example: therapies used to treat  cancer and   rheumatological diseases). However, even if you do not have one on these, it can still happen. If you have any of these conditions, or take one of these drugs, make sure to notify your treating physician. 4. Nerve Damage: This is more common when the treatment is an invasive one, but it can also happen with the use of medications, such as those used in the treatment of cancer. The damage can occur to small secondary nerves, or to large primary ones, such as those in the spinal cord and brain. This damage may be temporary or permanent and it may lead to impairments that can range from temporary numbness to permanent paralysis and/or brain death. 5. Allergic Reactions: Any time a substance or material comes in contact with our body, there is the possibility of an allergic reaction. These can range from a mild skin rash (contact dermatitis) to a severe systemic reaction (anaphylactic reaction), which can result in death. 6. Death: In general, any medical intervention can result in death, most of the time due to an unforeseen complication. ____________________________________________________________________________________________   Facet Joint Block The facet joints connect the bones of the spine (vertebrae). They make it possible for you to bend, twist, and make other movements with your spine. They also keep you from bending too far, twisting too far, and making other extreme movements. A facet joint block is a procedure in which a numbing medicine (anesthetic) is injected into a facet joint. In many cases, an anti-inflammatory medicine (steroid) is also injected. A facet joint block may be done:  To diagnose neck or back pain. If the pain gets better after a facet joint block, it means the pain is probably coming from the facet joint. If the pain does not get better, it means the pain is probably not coming from the facet joint.  To relieve neck or back pain that is caused by an inflamed  facet joint. A facet joint block is only done to relieve pain if the pain does not improve with other methods, such as medicine, exercise programs, and physical therapy. Tell a health care provider about:  Any allergies you have.  All medicines you are taking, including vitamins, herbs, eye drops, creams, and over-the-counter medicines.  Any problems you or family members have had with anesthetic medicines.  Any blood disorders you have.  Any surgeries you have had.  Any medical conditions you have or have had.  Whether you are pregnant or may be pregnant. What are the risks? Generally, this is a safe procedure. However, problems may occur, including:  Bleeding.  Injury to a nerve near the injection site.  Pain at the injection site.  Weakness or numbness in areas controlled by nerves near the injection site.  Infection.  Temporary fluid retention.  Allergic reactions to medicines or dyes.  Injury to other structures or organs near the injection site. What happens before the procedure? Medicines Ask your health care provider about:  Changing or stopping your regular medicines. This is especially important if you are taking diabetes medicines or blood thinners.  Taking medicines such as aspirin and ibuprofen. These medicines can thin your blood. Do not take these medicines unless your health care provider tells you to take them.  Taking over-the-counter medicines, vitamins, herbs, and supplements. Eating and drinking Follow instructions from your health care provider about eating and drinking, which may include:  8 hours before the procedure - stop eating heavy meals or foods, such as meat, fried foods, or fatty foods.  6 hours before the procedure - stop eating light meals or foods, such as toast or cereal.  6 hours before the procedure - stop drinking milk or drinks that contain milk.  2 hours before the procedure - stop drinking clear liquids. Staying  hydrated Follow instructions from your health care provider about hydration, which may include:  Up to 2 hours before the procedure - you may continue to drink clear liquids, such as water, clear fruit juice, black coffee, and plain tea. General instructions  Do not use any products that contain nicotine or tobacco for at least 4-6 weeks before the procedure. These products include cigarettes, e-cigarettes, and chewing tobacco. If you need help quitting, ask your health care provider.  Plan to have someone take you home from the hospital or clinic.  Ask your health care provider: ? How your surgery site will be marked. ? What steps will be taken to help prevent infection. These may include:  Removing hair at the surgery site.  Washing skin with a germ-killing soap.  Receiving antibiotic medicine. What happens during the procedure?  You will put on a hospital gown.  You will lie on your stomach on an X-ray table. You may be asked to lie in a different position if an injection will be made in your neck.  Machines will be used to monitor your oxygen levels, heart rate, and blood pressure.  Your skin will be cleaned.  If an injection will be made in your neck, an IV will be inserted into one of your veins. Fluids and medicine will flow directly into your body through the IV.  A numbing medicine (local anesthetic) will be applied to your skin. Your skin may sting or burn for a moment.  A video X-ray machine (fluoroscopy) will be used to find the joint. In some cases, a CT scan may be used.  A contrast dye may be injected into the facet joint area to help find the joint.  When the joint is located, an anesthetic will be injected into the joint through the needle.  Your health care provider will ask you whether you feel pain relief. ? If you feel relief, a steroid may be injected to provide pain relief for a longer period of time. ? If you do not feel relief or feel only partial  relief, additional injections of an anesthetic may be made in other facet joints.  The needle will be removed.  Your skin will be cleaned.  A bandage (dressing) will be applied over each injection site. The procedure may vary among health care providers and hospitals.   What happens after the procedure?  Your blood pressure, heart rate, breathing rate, and blood oxygen level will be monitored until you leave the hospital or clinic.  You will lie down and rest for a period of time. Summary  A facet joint block is a procedure in which a numbing medicine (anesthetic) is injected into a facet joint. An anti-inflammatory medicine (stereoid) may also be injected.  Follow instructions from your health care provider about medicines and eating and drinking before the procedure.  Do not use any products that contain nicotine or tobacco for at least 4-6 weeks before the procedure.  You will lie on your stomach for the procedure, but you may be asked to lie in a different position if an injection will be made in your neck.  When the joint is located, an anesthetic will be injected into the joint through the needle. This  information is not intended to replace advice given to you by your health care provider. Make sure you discuss any questions you have with your health care provider. Document Revised: 03/16/2019 Document Reviewed: 10/28/2018 [employer] Patient Education  2021 [employer].

## 2021-04-08 NOTE — Progress Notes (Signed)
PROVIDER NOTE: Information contained herein reflects review and annotations entered in association with encounter. Interpretation of such information and data should be left to medically-trained personnel. Information provided to patient can be located elsewhere in the medical record under "Patient Instructions". Document created using STT-dictation technology, any transcriptional errors that may result from process are unintentional.    Patient: Brandi Newton  Service Category: E/M  Provider: Gaspar Cola, MD  DOB: 05/02/1946  DOS: 04/08/2021  Specialty: Interventional Pain Management  MRN: [id number]  Setting: Ambulatory outpatient  PCP: Ranae Plumber, PA  Type: Established Patient    Referring Provider: Ranae Plumber, PA  Location: Office  Delivery: Face-to-face     Primary Reason(s) for Visit: Encounter for evaluation before starting new chronic pain management plan of care (Level of risk: moderate) CC: Back Pain (lower)  HPI  Brandi Newton is a 75 y.o. year old, female patient, who comes today for a follow-up evaluation to review the test results and decide on a treatment plan. She has Cough; Allergic rhinitis; GERD (gastroesophageal reflux disease); Singer's nodule; S/P total hysterectomy and bilateral salpingo-oophorectomy; Osteoarthritis of knees (Bilateral); Lumbar radiculopathy (Right); Bilateral carpal tunnel syndrome; Cervical radiculopathy; Edema of right lower leg; Osteoarthritis of knee (Left); Osteoarthritis of knee (Right); Cervical myelopathy ([place]); Chronic pain syndrome; Pharmacologic therapy; Disorder of skeletal system; Problems influencing health status; Abnormal MRI, cervical spine (10/19/2018); Abnormal MRI, lumbar spine (12/24/2020); Abnormal MRI, thoracic spine (12/24/2020); Chronic low back pain (1ry area of Pain) (Bilateral) (R>L) w/o sciatica; 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); Chronic upper back pain (2ry area of Pain) (Bilateral) (R>L); Chronic  lower extremity pain (3ry area of Pain) (Bilateral); Lumbar facet syndrome (Bilateral); Chronic knee pain after total replacement (4th area of Pain) (Bilateral) (L>R); Chronic sacroiliac joint pain (Bilateral) (L>R); Abnormal lower extremity EMG (electromyogram) (01/26/2018); Abnormal upper extremity NCS (nerve conduction studies) (01/24/2018); History of total knee replacement (Bilateral); Lumbar Grade 1 Retrolisthesis of L3/L4; Lumbar facet hypertrophy (Multilevel) (Bilateral); Chronic sacroiliac joint pain (Left); Enthesopathy of sacroiliac joint (Left); Spondylosis without myelopathy or radiculopathy, lumbosacral region; 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 Her primarily concern today is the Back Pain (lower)  Pain Assessment: Location: Lower Back Radiating: denies Onset: More than a month ago Duration: Chronic pain Quality: Nagging Severity: 4 /10 (subjective, self-reported pain score)  Timing: Intermittent Modifying factors: sitting, sitting in recliner BP: (!) 143/79  HR: 72  Brandi Newton comes in today for a follow-up visit after her initial evaluation on 03/19/2021. Today we went over the results of her tests. These were explained in "Layman's terms". During today's appointment we went over my diagnostic impression, as well as the proposed treatment plan.  Review: According to the patient her primary area of pain was that of the lower back (Bilateral) (R>L).  She denied any prior lumbar spine surgery, physical therapy, but did admit to having had a nerve block done in the past.  She also had a recent MRI ordered by Dr. Lacinda Axon.  According to her records she had some epidural steroid injections done at [facility] by Dr. Jola Baptist.   The patient secondary area pain was that of the upper back (Bilateral) (R>L).  The  patient denied any thoracic surgeries but she did have a posterior cervical decompression 35 years ago.  She denies any physical therapy or recent x-rays of that area.  She also denies any nerve blocks in that area.  According to the medical record the patient also had nerve conduction test done by Dr. Melrose Nakayama at the [facility].  The patient does have a history of a C4-C7 cervical fusion done on 05/11/2018 by Dr. Cari Caraway.  The patient's third area pain is that of the lower extremities (Bilateral) (L>R).  Specifically the pain is in the area of the knees, bilaterally, s/p bilateral total knee replacement.  In the case of the left knee, which is the one that hurts the most, the pain seems to be anterior to the knee, just below the patella.  In addition, the patient complains of bilateral feet tingling and a sensation of them being called.  However, she indicates that her husband tells her that her feet are not cold, but she does feel them very cold.  She is given gabapentin for this, which she indicates does not work.  She refers that the only thing that seems to work is wrapping her feet with a blanket.  Initial physical exam: The patient had negative findings on toe walk and heel walk, both of which she was able to do although her balance seems to be less than perfect.  In addition, the patient had negative bilateral straight leg raise.  She did have a positive bilateral lumbar facet arthralgia on hyperextension on rotation maneuvers of the lumbar spine.  She also had pain across the lower back with simple hyperextension of the lumbar spine.  Provocative Patrick maneuver was positive bilaterally for sacroiliac joint arthralgia with the left side being worse than the right.  The patient also demonstrated decreased range of motion of the left hip, but did not experience any significant pain in that area with maneuver.  In addition, the patient had exquisite tenderness to palpation over the PSIS with the  right side being worse than the left hand the right side referring pain towards the right shoulder when pressure was exerted over the PSIS trigger point.  Physical exam today after her point injections: She no longer presents with pain in the right SI joint.  Hyperextension on rotation was positive for bilateral lumbar facet arthralgia and Patrick maneuver was positive for a left-sided sacroiliac joint arthralgia.  At this point, the pain from the facet joint seems to be more significant and therefore we will go ahead and schedule the patient to return for a diagnostic bilateral lumbar facet block under fluoroscopic guidance and IV sedation.  The plan was shared with the patient who understood and accepted.  Today's follow-up at the bilateral paraspinal trigger point injections done on 03/20/2021 indicated that although the patient did get some benefit, this seems to have been primarily from the systemic effect of the steroid.  However the diagnostic portion of the test was not impressive, suggesting that only 20% of the pain is somewhat associated with muscles in that area.  However, the exact etiology of the pain does not seem to be that of the trigger points.  This information was shared with the patient along with the plan which she understood and accepted.  Post-Procedure Evaluation  Procedure (03/20/2021): Diagnostic/therapeutic bilateral paraspinal/paravertebral trigger point injection #1, no fluoroscopy or IV sedation (PSIS level) Pre-procedure pain level: 5/10 Post-procedure: 4/10 Limited initial benefit, possibly due to rapid discharge after no sedation procedure, without enough time to allow full onset of block.  Sedation: None.  Effectiveness during initial hour after procedure(Ultra-Short Term Relief): 20 %.  Local anesthetic used: Long-acting (4-6 hours)  Effectiveness: Defined as any analgesic benefit obtained secondary to the administration of local anesthetics. This carries significant  diagnostic value as to the etiological location, or anatomical origin, of the pain. Duration of benefit is expected to coincide with the duration of the local anesthetic used.  Effectiveness during initial 4-6 hours after procedure(Short-Term Relief): 30 %.  Long-term benefit: Defined as any relief past the pharmacologic duration of the local anesthetics.  Effectiveness past the initial 6 hours after procedure(Long-Term Relief): 70 %.  Current benefits: Defined as benefit that persist at this time.   Analgesia:  Ongoing 70% relief of the low back pain, especially on the right side.  However, the left seems to continue to be causing some discomfort which appears to be a combination of pain, from the facet joint and the SI joint on the left side. Function: Ms. Kluge reports improvement in function ROM: Ms. Wamboldt reports improvement in ROM  Controlled Substance Pharmacotherapy Assessment REMS (Risk Evaluation and Mitigation Strategy)  Analgesic: None MME/day: 0 mg/day  Pill Count: None expected due to no prior prescriptions written by our practice. Landis Martins, RN  04/08/2021  1:26 PM  Sign when Signing Visit Safety precautions to be maintained throughout the outpatient stay will include: orient to surroundings, keep bed in low position, maintain call bell within reach at all times, provide assistance with transfer out of bed and ambulation.    Pharmacokinetics: Liberation and absorption (onset of action): WNL Distribution (time to peak effect): WNL Metabolism and excretion (duration of action): WNL         Pharmacodynamics: Desired effects: Analgesia: Ms. Cagley reports >50% benefit. Functional ability: Patient reports that medication allows her to accomplish basic ADLs Clinically meaningful improvement in function (CMIF): Sustained CMIF goals met Perceived effectiveness: Described as relatively effective, allowing for increase in activities of daily living (ADL) Undesirable  effects: Side-effects or Adverse reactions: None reported Monitoring: [place] PMP: PDMP reviewed during this encounter. Online review of the past 6-monthperiod previously conducted. Not applicable at this point since we have not taken over the patient's medication management yet. List of other Serum/Urine Drug Screening Test(s):  No results found for: AMPHSCRSER, BARBSCRSER, BENZOSCRSER, COCAINSCRSER, COCAINSCRNUR, PCPSCRSER, THCSCRSER, THCU, CANNABQUANT, [place]ick[place]ow[place]List of all UDS test(s) done:  Lab Results  Component Value Date   SUMMARY Note 03/19/2021   Last UDS on record: Summary  Date Value Ref Range Status  03/19/2021 Note  Final    Comment:    ==================================================================== Compliance Drug Analysis, Ur ==================================================================== Test                             Result       Flag       Units  Drug Present and Declared for Prescription Verification   Gabapentin                     PRESENT      EXPECTED   Citalopram                     PRESENT      EXPECTED   Desmethylcitalopram            PRESENT      EXPECTED    Desmethylcitalopram is an expected metabolite of citalopram or the    enantiomeric form, escitalopram.    Acetaminophen  PRESENT      EXPECTED  Drug Present not Declared for Prescription Verification   Dextromethorphan               PRESENT      UNEXPECTED   Dextrorphan/Levorphanol        PRESENT      UNEXPECTED    Dextrorphan is an expected metabolite of dextromethorphan, an over-    the-counter or prescription cough suppressant. Dextrorphan cannot be    distinguished from the scheduled prescription medication levorphanol    by the method used for analysis.  ==================================================================== Test                      Result    Flag   Units      Ref Range   Creatinine              91               mg/dL       >=58 ==================================================================== Declared Medications:  The flagging and interpretation on this report are based on the  following declared medications.  Unexpected results may arise from  inaccuracies in the declared medications.   **Note: The testing scope of this panel includes these medications:   Escitalopram (Lexapro)  Gabapentin (Neurontin)   **Note: The testing scope of this panel does not include small to  moderate amounts of these reported medications:   Acetaminophen (Tylenol)   **Note: The testing scope of this panel does not include the  following reported medications:   Albuterol (Ventolin HFA)  Atorvastatin (Lipitor)  Benzonatate (Tessalon)  Cetirizine (Zyrtec)  Cyanocobalamin  Fluticasone (Flonase)  Hydrochlorothiazide (Hydrodiuril)  Losartan (Cozaar)  Polyethylene Glycol (MiraLAX)  Tiotropium (Spiriva)  Vitamin D ==================================================================== For clinical consultation, please call [phone]. ====================================================================    UDS interpretation: No unexpected findings.          Medication Assessment Form: Patient introduced to form today Treatment compliance: Treatment may start today if patient agrees with proposed plan. Evaluation of compliance is not applicable at this point Risk Assessment Profile: Aberrant behavior: See initial evaluations. None observed or detected today Comorbid factors increasing risk of overdose: See initial evaluation. No additional risks detected today Opioid risk tool (ORT):  Opioid Risk  03/19/2021  Alcohol 1  Illegal Drugs 0  Rx Drugs 0  Alcohol 0  Illegal Drugs 0  Rx Drugs 0  Age between 16-45 years  0  History of Preadolescent Sexual Abuse 0  Psychological Disease 0  Depression 1  Opioid Risk Tool Scoring 2  Opioid Risk Interpretation Low Risk    ORT Scoring interpretation table:  Score  <3 = Low Risk for SUD  Score between 4-7 = Moderate Risk for SUD  Score >8 = High Risk for Opioid Abuse   Risk of substance use disorder (SUD): Low  Risk Mitigation Strategies:  Patient opioid safety counseling: Completed today. Counseling provided to patient as per "Patient Counseling Document". Document signed by patient, attesting to counseling and understanding Patient-Prescriber Agreement (PPA): Obtained today.  Controlled substance notification to other providers: Written and sent today.  Pharmacologic Plan: Today we may be taking over the patient's pharmacological regimen. See below.             Laboratory Chemistry Profile   Renal Lab Results  Component Value Date   BUN 23 03/19/2021   CREATININE 1.40 (H) 03/19/2021   BCR 16 03/19/2021   GFRAA 39 (L) 05/15/2020  GFRNONAA 34 (L) 05/15/2020   PROTEINUR NEGATIVE 05/03/2020     Electrolytes Lab Results  Component Value Date   NA 141 03/19/2021   K 3.9 03/19/2021   CL 102 03/19/2021   CALCIUM 10.2 03/19/2021   MG 1.9 03/19/2021     Hepatic Lab Results  Component Value Date   AST 16 03/19/2021   ALT 17 05/03/2020   ALBUMIN 4.2 03/19/2021   ALKPHOS 96 03/19/2021   LIPASE 34 08/28/2015     ID Lab Results  Component Value Date   SARSCOV2NAA NEGATIVE 05/10/2020   STAPHAUREUS NEGATIVE 05/03/2020   MRSAPCR NEGATIVE 05/03/2020     Bone Lab Results  Component Value Date   25OHVITD1 55 03/19/2021   25OHVITD2 <1.0 03/19/2021   25OHVITD3 55 03/19/2021     Endocrine Lab Results  Component Value Date   GLUCOSE 83 03/19/2021   GLUCOSEU NEGATIVE 05/03/2020     Neuropathy Lab Results  Component Value Date   VITAMINB12 758 03/19/2021     CNS No results found for: COLORCSF, APPEARCSF, RBCCOUNTCSF, WBCCSF, POLYSCSF, LYMPHSCSF, EOSCSF, PROTEINCSF, GLUCCSF, JCVIRUS, CSFOLI, IGGCSF, LABACHR, ACETBL, LABACHR, ACETBL   Inflammation (CRP: Acute  ESR: Chronic) Lab Results  Component Value Date   CRP 18 (H)  03/19/2021   ESRSEDRATE 24 03/19/2021     Rheumatology No results found for: RF, ANA, LABURIC, URICUR, LYMEIGGIGMAB, LYMEABIGMQN, HLAB27   Coagulation Lab Results  Component Value Date   INR 0.99 05/03/2018   LABPROT 13.0 05/03/2018   APTT 28 05/03/2018   PLT 280 05/15/2020     Cardiovascular Lab Results  Component Value Date   HGB 10.3 (L) 05/15/2020   HCT 31.5 (L) 05/15/2020     Screening Lab Results  Component Value Date   SARSCOV2NAA NEGATIVE 05/10/2020   COVIDSOURCE NASOPHARYNGEAL 04/24/2019   STAPHAUREUS NEGATIVE 05/03/2020   MRSAPCR NEGATIVE 05/03/2020     Cancer Lab Results  Component Value Date   CA125 29.1 09/18/2015     Allergens No results found for: ALMOND, APPLE, ASPARAGUS, AVOCADO, BANANA, BARLEY, BASIL, BAYLEAF, GREENBEAN, LIMABEAN, WHITEBEAN, BEEFIGE, REDBEET, BLUEBERRY, BROCCOLI, CABBAGE, MELON, CARROT, CASEIN, CASHEWNUT, CAULIFLOWER, CELERY     Note: Lab results reviewed.  Recent Diagnostic Imaging Review  Cervical Imaging: Cervical MR wo contrast: Results for orders placed during the hospital encounter of 10/19/18 MR CERVICAL SPINE WO CONTRAST  Narrative CLINICAL DATA:  Initial evaluation for bilateral arm numbness and tingling with extension down spine since surgery in June of 2019.  EXAM: MRI CERVICAL SPINE WITHOUT CONTRAST  TECHNIQUE: Multiplanar, multisequence MR imaging of the cervical spine was performed. No intravenous contrast was administered.  COMPARISON:  Prior radiographs from 05/11/2018 as well as previous MRI from 01/21/2018.  FINDINGS: Alignment: Straightening of the normal cervical lordosis. Trace anterolisthesis of C4 on C5, improved relative to preoperative exam.  Vertebrae: Susceptibility artifact from prior ACDF at C4 through C7. Vertebral body heights maintained. No acute or interval fracture. Bone marrow signal intensity within normal limits. No discrete or worrisome osseous lesions. No abnormal marrow  edema.  Cord: Persistent T2 signal abnormality within the cervical spinal cord at the level of C4-5, compatible with chronic myelomalacia. Signal intensity within the cervical spinal cord otherwise within normal limits.  Posterior Fossa, vertebral arteries, paraspinal tissues: Visualized brain and posterior fossa normal. Paraspinous and prevertebral soft tissues demonstrate no acute finding. Remote postsurgical changes noted. Normal intravascular flow voids present within the vertebral arteries bilaterally. Left vertebral artery dominant.  Disc levels:  C2-C3: Mild disc bulge.  Left greater than right uncovertebral spurring with left-sided facet hypertrophy. Mild left C3 foraminal narrowing. No significant canal or right foraminal stenosis. Appearance is stable from previous.  C3-C4: Diffuse circumferential disc osteophyte complex with intervertebral disc space narrowing. Severe bilateral facet arthrosis, slightly worse on the left. Borderline mild spinal stenosis, stable. Severe right with moderate left C4 foraminal narrowing, also unchanged.  C4-C5: Prior ACDF. No significant residual spinal stenosis. Moderate right with mild left C5 foraminal stenosis, also improved.  C5-C6: Prior fusion. No residual spinal stenosis. Left greater than right uncovertebral hypertrophy with resultant moderate left and mild right C6 foraminal narrowing.  C6-C7: Prior ACDF. No residual spinal stenosis. Residual mild to moderate right C7 foraminal narrowing, improved from previous.  C7-T1:  Mild facet and ligament flavum hypertrophy.  No stenosis.  Visualized upper thoracic spine demonstrates no significant finding.  IMPRESSION: 1. Prior ACDF at C4 through C7 without residual spinal stenosis. Improved multilevel foraminal narrowing at these levels as above, consistent with interval foraminotomies. 2. Degenerative disc osteophyte at C3-4 with resultant severe right and moderate left C4  foraminal stenosis, stable. 3. Persistent T2 signal abnormality within the cervical spinal cord at C4-5, compatible with chronic myelomalacia.   Electronically Signed By: Jeannine Boga M.D. On: 10/19/2018 20:13  Results for orders placed during the hospital encounter of 01/21/18 MR CERVICAL SPINE WO CONTRAST  Narrative CLINICAL DATA:  Cervical radiculopathy. Numbness in both hands, left worse than right. Right arm tingling.  EXAM: MRI CERVICAL SPINE WITHOUT CONTRAST  TECHNIQUE: Multiplanar, multisequence MR imaging of the cervical spine was performed. No intravenous contrast was administered.  COMPARISON:  None.  FINDINGS: Alignment: Mild reversal of the normal cervical lordosis. 4 mm anterolisthesis of C4 on C5 likely degenerative and related to advanced facet arthrosis.  Vertebrae: No fracture, suspicious osseous lesion, or significant marrow edema. Right-sided degenerative endplate changes at V4-9 greater than C3-4.  Cord: Abnormal T2 hyperintensity focally in the cord bilaterally at C4-5 with evidence of mild volume loss. Focal susceptibility artifact in or along the surface of the right lateral cord at C7-T1 suggestive of chronic blood products.  Posterior Fossa, vertebral arteries, paraspinal tissues: Susceptibility artifact in the posterior neck soft tissues related to prior cervical spine surgery. Preserved vertebral artery flow voids.  Disc levels:  Moderate disc space narrowing from C3-4 to C6-7.  C2-3: Mild disc bulging, left uncovertebral spurring, and mild left facet arthrosis result in mild left neural foraminal stenosis without spinal stenosis.  C3-4: Broad-based posterior disc osteophyte complex and severe facet arthrosis result in borderline to mild spinal stenosis and severe right and moderate left neural foraminal stenosis.  C4-5: Anterolisthesis, bulging uncovered disc, uncovertebral spurring, and severe right and mild left facet  arthrosis result in moderate spinal stenosis with mild cord flattening and moderate right and mild left neural foraminal stenosis.  C5-6: Broad-based posterior disc osteophyte complex results in mild spinal stenosis and mild bilateral neural foraminal stenosis.  C6-7: Postoperative changes posteriorly on the right with suspected fibrosis near the medial aspect of the right neural foramen. No definite compressive neural foraminal stenosis. Minimal disc bulging without spinal stenosis.  C7-T1: Minimal disc bulging and mild facet arthrosis without stenosis.  Sagittal imaging of the upper thoracic spine demonstrates mild neural foraminal stenosis on the left at T1-2 and bilaterally at T2-3 due to facet arthrosis. Mild disc bulging at T2-3 and T3-4 without evidence of significant spinal stenosis.  IMPRESSION: 1. Severe C4-5 facet arthrosis with grade 1 anterolisthesis, moderate spinal stenosis,  moderate right neural foraminal stenosis, and spinal cord myelomalacia. 2. Mild spinal stenosis at C3-4 and C5-6. 3. Prior posterior right foraminal decompression at C6-7.   Electronically Signed By: Logan Bores M.D. On: 01/21/2018 19:58  Cervical DG 2-3 views: Results for orders placed during the hospital encounter of 05/11/18 DG Cervical Spine 2 or 3 views  Narrative CLINICAL DATA:  Status post cervical spine fusion.  EXAM: CERVICAL SPINE - 2-3 VIEW  COMPARISON:  MR cervical spine dated January 21, 2018.  FINDINGS: The lateral view is diagnostic to the C7 level. Interval C4-C7 ACDF with interbody grafts. There is no acute fracture or subluxation. Vertebral body heights are preserved. Alignment is normal. Mild disc height loss at C3-C4. Facet arthropathy at C3-C4 and C7-T1. Postsurgical prevertebral soft tissue swelling in the mid to lower cervical spine.  IMPRESSION: C4-C7 ACDF without evidence of acute postoperative complication.   Electronically Signed By: Titus Dubin M.D. On: 05/11/2018 18:55  DG Cervical Spine 2-3 Views  Narrative CLINICAL DATA:  Intraoperative radiograph.  EXAM: CERVICAL SPINE - 2-3 VIEW; DG C-ARM 61-120 MIN; DG C-ARM 1-60 MIN-NO REPORT  COMPARISON:  None.  FINDINGS: Intraoperative fluoroscopic images demonstrate anterior fusion at the level of C4-C7 with plate/intrapedicular screws and disc spacers. No immediate complications noted.  Fluoroscopy time is recorded as 6.7 seconds.  IMPRESSION: Intraoperative fluoroscopic images from anterior C4-C7 cervical fusion.   Electronically Signed By: Fidela Salisbury M.D. On: 05/11/2018 15:24  Cervical DG Bending/F/E views: Results for orders placed during the hospital encounter of 03/19/21 DG Cervical Spine With Flex & Extend  Narrative CLINICAL DATA:  Cervicalgia, chronic upper back pain  EXAM: CERVICAL SPINE COMPLETE WITH FLEXION AND EXTENSION VIEWS  COMPARISON:  MRI cervical spine 10/19/2018  FINDINGS: Bones demineralized.  Prevertebral soft tissues normal thickness.  Prior anterior fusion C4-C7 with incorporated bone plugs.  Disc space narrowing C3-C4.  Multilevel facet degenerative changes.  Vertebral body heights maintained without fracture or bone destruction.  Mild retrolisthesis at C3-C4 of 2 mm, nearly completely resolved with flexion and increased to 3 mm with extension.  Encroachment upon BILATERAL cervical neural foramina at C3-C4 by uncovertebral spurs greater on RIGHT.  Lung apices clear.  IMPRESSION: Prior anterior fusion C4-C7.  Degenerative disc disease changes C3-C4 with multilevel facet degenerative changes.  Mild retrolisthesis C3-C4 which reduces with flexion and increases with extension as above.   Electronically Signed By: Lavonia Dana M.D. On: 03/20/2021 16:40  Thoracic Imaging: Thoracic MR wo contrast: Results for orders placed during the hospital encounter of 12/24/20 MR THORACIC SPINE WO  CONTRAST  Narrative CLINICAL DATA:  Mid back pain along bra line  EXAM: MRI THORACIC SPINE WITHOUT CONTRAST  TECHNIQUE: Multiplanar, multisequence MR imaging of the thoracic spine was performed. No intravenous contrast was administered.  COMPARISON:  None.  FINDINGS: Alignment:  Normal  Vertebrae: Negative for fracture or mass. Multilevel disc degeneration. Discogenic edema at T3-4 and T5-6.  Cord:  Normal signal and morphology.  No cord lesion.  Paraspinal and other soft tissues: Metallic fragments in the posterior soft tissues lower neck compatible with prior cervical spine surgery. No soft tissue mass.  Disc levels:  T1-2: Mild disc degeneration and spurring. Mild foraminal narrowing bilaterally  T2-3: Mild disc and facet degeneration. Mild foraminal narrowing bilaterally.  T3-4: Moderate disc degeneration with disc space narrowing. Diffuse endplate spurring. No significant stenosis  T4-5: Moderate disc degeneration with diffuse endplate spurring. Negative for stenosis.  T5-6: Moderate disc degeneration. Shallow central disc  protrusion without significant stenosis.  T6-7: Left paracentral disc protrusion. Cord flattening on the left without significant spinal or foraminal stenosis  T7-8: Central extruded disc fragment with downgoing disc material. Small disc protrusion without significant spinal or foraminal stenosis.  T8-9: Disc degeneration with Schmorl's node. Mild facet degeneration. Negative for stenosis  T9-10: Disc degeneration with diffuse endplate spurring. Bilateral facet degeneration. Mild foraminal narrowing bilaterally.  T10-11: Negative  T11-12: Mild disc and facet degeneration with small central disc protrusion. Negative for stenosis  T12-L1: Small central disc protrusion.  Negative for stenosis.  IMPRESSION: Multilevel disc and facet degeneration in the thoracic spine. Mild foraminal narrowing bilaterally T1-2 and T2-3 and T9-10.  Multiple small disc protrusions.   Electronically Signed By: Franchot Gallo M.D. On: 12/24/2020 13:27  Thoracic DG 2-3 views: Results for orders placed during the hospital encounter of 03/19/21 DG Thoracic Spine 2 View  Narrative CLINICAL DATA:  Upper thoracic pain  EXAM: THORACIC SPINE 2 VIEWS  COMPARISON:  MR thoracic spine 12/24/2020  FINDINGS: Osseous demineralization.  Vertebral body and disc space heights maintained.  Dextroconvex thoracolumbar scoliosis.  No fracture, subluxation, or bone destruction.  Scattered disc space narrowing greatest at T9-T10.  Atherosclerotic calcification aorta.  Prior cervical fusion C4-C7.  IMPRESSION: Mild degenerative disc disease changes and dextroconvex scoliosis of the thoracolumbar spine.   Electronically Signed By: Lavonia Dana M.D. On: 03/20/2021 16:44  Lumbosacral Imaging: Lumbar MR wo contrast: Results for orders placed during the hospital encounter of 12/24/20 MR LUMBAR SPINE WO CONTRAST  Narrative CLINICAL DATA:  Low back pain bilateral leg pain  EXAM: MRI LUMBAR SPINE WITHOUT CONTRAST  TECHNIQUE: Multiplanar, multisequence MR imaging of the lumbar spine was performed. No intravenous contrast was administered.  COMPARISON:  Lumbar MRI 01/21/2018  FINDINGS: Segmentation:  Normal  Alignment: Mild retrolisthesis L3-4 with progression from the prior study. Otherwise normal alignment.  Vertebrae: Negative for fracture or mass. Progressive discogenic edema at L4-5. Progressive mild discogenic edema at L3-4 on the right.  Conus medullaris and cauda equina: Conus extends to the L1 level. Conus and cauda equina appear normal.  Paraspinal and other soft tissues: Negative for paraspinous mass, adenopathy, soft tissue edema  Disc levels:  T12-L1: Mild facet degeneration.  Negative for stenosis  L1-2: Shallow central disc protrusion and mild facet degeneration. Negative for stenosis. No interval  change.  L2-3: Disc degeneration with disc bulging and diffuse endplate spurring. Mild facet degeneration. Mild subarticular stenosis bilaterally. Mild spinal stenosis  L3-4: Asymmetric disc degeneration on the right. Diffuse disc bulging and endplate spurring right greater than left. Bilateral facet and ligamentum flavum hypertrophy. Moderate spinal stenosis. Moderate subarticular stenosis bilaterally right greater than left. Progressive stenosis since the prior study  L4-5: Progression of disc degeneration with disc space narrowing and moderate endplate edema. Diffuse endplate spurring. Bilateral facet degeneration. Moderate subarticular and foraminal stenosis on the right. Severe subarticular and foraminal stenosis on the left. Progressive stenosis since the prior study.  L5-S1: Disc degeneration with diffuse endplate spurring. Bilateral facet degeneration. Moderate subarticular and foraminal stenosis on the right. Moderate right subarticular stenosis.  IMPRESSION: Multilevel degenerative change throughout the lumbar spine. There is progression of degenerative change and stenosis compared with the MRI 2019.   Electronically Signed By: Franchot Gallo M.D. On: 12/24/2020 13:46  Results for orders placed during the hospital encounter of 01/21/18 MR LUMBAR SPINE WO CONTRAST  Narrative CLINICAL DATA:  Lumbar radiculopathy. Bilateral foot pain. Left leg paresthesias.  EXAM: MRI LUMBAR SPINE WITHOUT  CONTRAST  TECHNIQUE: Multiplanar, multisequence MR imaging of the lumbar spine was performed. No intravenous contrast was administered.  COMPARISON:  CT abdomen and pelvis 08/28/2015  FINDINGS: Segmentation:  Standard.  Alignment:  Mild lumbar spine straightening.  No listhesis.  Vertebrae: Unchanged L2 superior endplate Schmorl's node. Smaller Schmorl's nodes in the L3 superior endplate and at E32-Z2. No acute fracture or suspicious osseous lesion. Mild mixed Modic  endplate changes at Y4-M2.  Conus medullaris and cauda equina: Conus extends to the upper L1 level. Conus and cauda equina appear normal.  Paraspinal and other soft tissues: Unremarkable.  Disc levels:  Disc desiccation and mild disc space narrowing throughout the lumbar spine.  T12-L1: Mild facet hypertrophy without stenosis.  L1-2: Mild disc bulging without stenosis.  L2-3: Mild circumferential disc bulging and mild facet and ligamentum flavum hypertrophy result in mild spinal stenosis without neural foraminal stenosis.  L3-4: Mild circumferential disc bulging and mild facet and ligamentum flavum hypertrophy result in borderline to mild spinal stenosis without neural foraminal stenosis.  L4-5: Circumferential disc bulging, a small left paracentral disc extrusion with mild inferior migration, and mild facet and ligamentum flavum hypertrophy result in mild-to-moderate left lateral recess stenosis and potential left L5 nerve root impingement. There is minimal narrowing of the right lateral recess and both neural foramina. No significant spinal stenosis.  L5-S1: Circumferential disc bulging, endplate spurring, and mild facet hypertrophy result in mild bilateral neural foraminal stenosis without spinal stenosis.  IMPRESSION: 1. L4-5 disc extrusion resulting in left lateral recess stenosis and potential left L5 nerve root impingement. 2. Mild spinal stenosis at L2-3 and L3-4.   Electronically Signed By: Logan Bores M.D. On: 01/21/2018 20:04  Lumbar DG Bending views: Results for orders placed during the hospital encounter of 03/19/21 DG Lumbar Spine Complete W/Bend  Narrative CLINICAL DATA:  Abnormal MRI lumbar spine  EXAM: LUMBAR SPINE - COMPLETE WITH BENDING VIEWS  COMPARISON:  MRI lumbar spine 12/24/2020  FINDINGS: Five non-rib-bearing lumbar vertebra.  Osseous demineralization.  Multilevel disc space narrowing and endplate spur formation greatest at  L3-L4 and L4-L5.  Vertebral body heights maintained.  Facet degenerative changes lower lumbar spine.  Minimal anterolisthesis L4-L5.  Question AP narrowing of spinal canal at L5.  No fracture, additional subluxation, or bone destruction.  No spondylolysis.  SI joints preserved.  Atherosclerotic calcification aorta.  IMPRESSION: Degenerative disc and facet disease changes greatest at lower lumbar spine.  Question AP narrowing of spinal canal at L5.  No acute abnormalities.  Aortic Atherosclerosis (ICD10-I70.0).   Electronically Signed By: Lavonia Dana M.D. On: 03/20/2021 16:37        Sacroiliac Joint Imaging: Sacroiliac Joint DG: Results for orders placed during the hospital encounter of 03/19/21 DG Si Joints  Narrative CLINICAL DATA:  Crush low back pain  EXAM: BILATERAL SACROILIAC JOINTS - 3+ VIEW  COMPARISON:  None  FINDINGS: Degenerative disc and facet disease changes at lower lumbar spine.  SI joints preserved.  Sacral foramina symmetric.  No fracture or bone destruction.  Diffuse osseous demineralization noted.  IMPRESSION: No acute SI joint abnormalities.  Degenerative disc and facet disease changes of lower lumbar spine.   Electronically Signed By: Lavonia Dana M.D. On: 03/20/2021 16:45  Knee Imaging: Knee-R DG 1-2 views: Results for orders placed during the hospital encounter of 03/19/21 DG Knee 1-2 Views Right  Narrative CLINICAL DATA:  Chronic RIGHT knee pain after total knee replacement surgery  EXAM: RIGHT KNEE - 1-2 VIEW  COMPARISON:  04/27/2019  FINDINGS: Osseous demineralization.  Components of a RIGHT total knee prosthesis identified.  No acute fracture, dislocation, bone destruction or periprosthetic lucency.  No joint effusion.  IMPRESSION: RIGHT knee prosthesis.  No acute abnormalities.   Electronically Signed By: Lavonia Dana M.D. On: 03/20/2021 16:45  Complexity Note: Imaging results reviewed. Results  shared with Ms. Deery, using Layman's terms.                        Meds   Current Outpatient Medications:  .  acetaminophen (TYLENOL) 500 MG tablet, Take by mouth., Disp: , Rfl:  .  albuterol (VENTOLIN HFA) 108 (90 Base) MCG/ACT inhaler, Inhale 2 puffs into the lungs every 4 (four) hours as needed for wheezing or shortness of breath. , Disp: , Rfl:  .  atorvastatin (LIPITOR) 20 MG tablet, Take 20 mg by mouth daily., Disp: , Rfl:  .  benzonatate (TESSALON) 100 MG capsule, Take 100 mg by mouth daily as needed for cough., Disp: , Rfl:  .  cetirizine (ZYRTEC) 10 MG tablet, Take 10 mg by mouth every morning., Disp: , Rfl:  .  chlorthalidone (HYGROTON) 25 MG tablet, Take 25 mg by mouth daily., Disp: , Rfl:  .  cholecalciferol (VITAMIN D) 1000 units tablet, Take 1,000 Units by mouth daily. , Disp: , Rfl:  .  cyanocobalamin 1000 MCG tablet, Take 1,000 mcg by mouth daily., Disp: , Rfl:  .  escitalopram (LEXAPRO) 20 MG tablet, Take 20 mg by mouth every morning. , Disp: , Rfl:  .  fluticasone (FLONASE) 50 MCG/ACT nasal spray, Place 2 sprays into both nostrils daily as needed for allergies. , Disp: , Rfl:  .  losartan (COZAAR) 25 MG tablet, Take 50 mg by mouth every morning., Disp: , Rfl:  .  polyethylene glycol (MIRALAX / GLYCOLAX) packet, Take 17 g by mouth daily., Disp: , Rfl:  .  tiotropium (SPIRIVA) 18 MCG inhalation capsule, Place 18 mcg into inhaler and inhale daily., Disp: , Rfl:  .  gabapentin (NEURONTIN) 300 MG capsule, Take 300 mg by mouth at bedtime. , Disp: , Rfl:   ROS  Constitutional: Denies any fever or chills Gastrointestinal: No reported hemesis, hematochezia, vomiting, or acute GI distress Musculoskeletal: Denies any acute onset joint swelling, redness, loss of ROM, or weakness Neurological: No reported episodes of acute onset apraxia, aphasia, dysarthria, agnosia, amnesia, paralysis, loss of coordination, or loss of consciousness  Allergies  Ms. Maricle is allergic to percocet  [oxycodone-acetaminophen] and ace inhibitors.  PFSH  Drug: Ms. Kesselman  reports no history of drug use. Alcohol:  reports current alcohol use. Tobacco:  reports that she quit smoking about 42 years ago. Her smoking use included cigarettes. She has a 10.00 pack-year smoking history. She has never used smokeless tobacco. Medical:  has a past medical history of Anemia, Arthritis, Asthma, Bilateral ovarian cysts, Bronchitis, Constipation, Family history of adverse reaction to anesthesia, GERD (gastroesophageal reflux disease), H/O seasonal allergies, History of kidney stones, Hypertension, PONV (postoperative nausea and vomiting), and Sinus problem. Surgical: Ms. 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 Family: family history includes Heart disease in her father and mother; Skin cancer in her father.  Constitutional Exam  General appearance: Well nourished, well developed,  and well hydrated. In no apparent acute distress Vitals:   04/08/21 1325  BP: (!) 143/79  Pulse: 72  Resp: 18  Temp: (!) 97 F (36.1 C)  TempSrc: Temporal  SpO2: 97%  Weight: 205 lb (93 kg)  Height: _0  (1.702 m)   BMI Assessment: Estimated body mass index is 32.11 kg/m as calculated from the following:   Height as of this encounter: _1  (1.702 m).   Weight as of this encounter: 205 lb (93 kg).  BMI interpretation table: BMI level Category Range association with higher incidence of chronic pain  <18 kg/m2 Underweight   18.5-24.9 kg/m2 Ideal body weight   25-29.9 kg/m2 Overweight Increased incidence by 20%  30-34.9 kg/m2 Obese (Class I) Increased incidence by 68%   35-39.9 kg/m2 Severe obesity (Class II) Increased incidence by 136%  >40 kg/m2 Extreme obesity (Class III) Increased incidence by 254%   Patient's current BMI Ideal Body weight  Body mass index is 32.11 kg/m. Ideal body weight: 61.6 kg (135 lb 12.9 oz) Adjusted ideal body weight: 74.2 kg (163 lb 7.7 oz)   BMI Readings from Last 4 Encounters:  04/08/21 32.11 kg/m  03/20/21 32.11 kg/m  03/19/21 32.11 kg/m  05/14/20 31.48 kg/m   Wt Readings from Last 4 Encounters:  04/08/21 205 lb (93 kg)  03/20/21 205 lb (93 kg)  03/19/21 205 lb (93 kg)  05/14/20 201 lb (91.2 kg)    Psych/Mental status: Alert, oriented x 3 (person, place, & time)       Eyes: PERLA Respiratory: No evidence of acute respiratory distress  Assessment & Plan  Primary Diagnosis & Pertinent Problem List: The primary encounter diagnosis was Lumbar facet syndrome (Bilateral). 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  Visit Diagnosis: 1. Lumbar facet syndrome (Bilateral)   2. Lumbar facet hypertrophy (Multilevel) (Bilateral)   3. Lumbar Grade 1 Retrolisthesis of L3/L4   4. Spondylosis without myelopathy or radiculopathy, lumbosacral region   5. Chronic low back pain (1ry area of Pain) (Bilateral) (R>L) w/o sciatica   6. Abnormal MRI, lumbar spine (12/24/2020)    Problems updated and reviewed during this visit: Problem  History of total knee replacement (Bilateral)  Lumbar Grade 1 Retrolisthesis of L3/L4  Lumbar facet hypertrophy (Multilevel) (Bilateral)   (12/24/2020) LUMBAR MRI FINDINGS: LEVELS: T12-L1: Mild facet degeneration. L1-2: mild facet degeneration. L2-3: Mild facet degeneration. L3-4: Bilateral facet hypertrophy. L4-5:  Bilateral facet degeneration. L5-S1: Bilateral facet degeneration.   Chronic sacroiliac joint pain (Left)  Enthesopathy of sacroiliac joint (Left)  Spondylosis Without Myelopathy Or Radiculopathy, Lumbosacral Region  Lumbar lateral recess stenosis (Bilateral: L2-3, L3-4, L4-5, L5-S1)   (12/24/2020) LUMBAR MRI FINDINGS: LEVELS: L2-3: Mild subarticular stenosis bilaterally. L3-4: Moderate subarticular stenosis bilaterally right greater than left. L4-5: Moderate subarticular and foraminal stenosis on the right. Severe subarticular and foraminal stenosis on the left.  L5-S1: Moderate subarticular and foraminal stenosis on the right. Moderate right subarticular stenosis.   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 Progressive stenosis since the prior study.   Lumbosacral foraminal stenosis (Right: L4-5, L5-S1) (Left: L4-5, severe)   (12/24/2020) LUMBAR MRI FINDINGS: LEVELS: L4-5: Moderate foraminal stenosis on the right. Severe foraminal stenosis on the left.  Progressive stenosis since the prior study. L5-S1: Moderate foraminal stenosis on the right.   Osteoarthritis of knee (Right)  Cervical Radiculopathy  Cervical Myelopathy (Hcc)  Lumbar radiculopathy (Right)   By EMG/PNCV   Osteoarthritis of knees (Bilateral)  Osteoarthritis of knee (Left)  Cellulitis of Left Knee (Resolved)  Status Post Total Knee Replacement Using Cement, Left (Resolved)  Knee Joint Replaced By Other Means (Resolved)  Status Post Total Knee Replacement Using Cement, Right (Resolved)    Plan of Care  Pharmacotherapy (Medications Ordered): No orders of the defined types were placed in this encounter.   Procedure Orders     LUMBAR FACET(MEDIAL BRANCH NERVE BLOCK) MBNB Lab Orders  No laboratory test(s) ordered today   Imaging Orders  No imaging studies ordered today   Referral Orders  No  referral(s) requested today    Pharmacological management options:  Opioid Analgesics: We'll take over management today. See above orders Membrane stabilizer: Options discussed, including a trial. Muscle relaxant: We have discussed the possibility of a trial NSAID: Trial discussed. Other analgesic(s): To be determined at a later time     Interventional Therapies  Risk  Complexity Considerations:   Estimated body mass index is 32.11 kg/m as calculated from the following:   Height as of this encounter: _0  (1.702 m).   Weight as of this encounter: 205 lb (93 kg). WNL   Planned  Pending:   Diagnostic bilateral lumbar facet block #1    Under consideration:   Diagnostic bilateral lumbar facet block #1  Diagnostic left sacroiliac joint block #1    Completed:   Diagnostic/therapeutic bilateral lumbar (PSIS) trigger point injections x1(03/20/2021) (20/30/70/70)   Therapeutic  Palliative (PRN) options:   None established    Provider-requested follow-up: Return for Procedure (w/ sedation): (B) L-FCT BLK #1. Recent Visits Date Type Provider Dept  03/20/21 Procedure visit Milinda Pointer, MD [facility]  03/19/21 Office Visit Milinda Pointer, MD [facility]  Showing recent visits within past 90 days and meeting all other requirements Today's Visits Date Type Provider Dept  04/08/21 Office Visit Milinda Pointer, MD [facility]  Showing today's visits and meeting all other requirements Future Appointments No visits were found meeting these conditions. Showing future appointments within next 90 days and meeting all other requirements  Primary Care Physician: Ranae Plumber, [place] Note by: Gaspar Cola, MD Date: 04/08/2021; Time: 3:12 PM

## 2021-04-08 NOTE — Progress Notes (Signed)
Safety precautions to be maintained throughout the outpatient stay will include: orient to surroundings, keep bed in low position, maintain call bell within reach at all times, provide assistance with transfer out of bed and ambulation.  

## 2021-04-14 ENCOUNTER — Telehealth: Payer: Self-pay | Admitting: *Deleted

## 2021-04-16 NOTE — Progress Notes (Signed)
PROVIDER NOTE: Information contained herein reflects review and annotations entered in association with encounter. Interpretation of such information and data should be left to medically-trained personnel. Information provided to patient can be located elsewhere in the medical record under "Patient Instructions". Document created using STT-dictation technology, any transcriptional errors that may result from process are unintentional.    Patient: Brandi Newton  Service Category: Procedure  Provider: Gaspar Cola, MD  DOB: 1946/04/21  DOS: 04/17/2021  Location: [facility]  MRN: [id number]  Setting: Ambulatory - outpatient  Referring Provider: Ranae Plumber, PA  Type: Established Patient  Specialty: Interventional Pain Management  PCP: Ranae Plumber, [place]   Primary Reason for Visit: Interventional Pain Management Treatment. CC: Back Pain (Low left)  Procedure:          Anesthesia, Analgesia, Anxiolysis:  Type: Lumbar Facet, Medial Branch Block(s) #1  Primary Purpose: Diagnostic Region: Posterolateral Lumbosacral Spine Level: L2, L3, L4, L5, & S1 Medial Branch Level(s). Injecting these levels blocks the L3-4, L4-5, and L5-S1 lumbar facet joints. Laterality: Bilateral  Type: Moderate (Conscious) Sedation combined with Local Anesthesia Indication(s): Analgesia and Anxiety Route: Intravenous (IV) IV Access: Secured Sedation: Meaningful verbal contact was maintained at all times during the procedure  Local Anesthetic: Lidocaine 1-2%  Position: Prone   Indications: 1. Lumbar facet syndrome (Bilateral)   2. Spondylosis without myelopathy or radiculopathy, lumbosacral region   3. Lumbar Grade 1 Retrolisthesis of L3/L4   4. DDD (degenerative disc disease), lumbosacral   5. Lumbar facet hypertrophy (Multilevel) (Bilateral)   6. Chronic low back pain (1ry area of Pain) (Bilateral) (R>L) w/o sciatica   7. Abnormal MRI, lumbar spine (12/24/2020)    Pain  Score: Pre-procedure: 7 /10 Post-procedure: 0-No pain/10   Pre-op H&P Assessment:  Brandi Newton is a 75 y.o. (year old), female patient, seen today for interventional treatment. 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 Brandi Newton has a current medication list which includes the following prescription(s): acetaminophen, albuterol, atorvastatin, benzonatate, cetirizine, chlorthalidone, cholecalciferol, cyanocobalamin, escitalopram, fluticasone, gabapentin, losartan, polyethylene glycol, and tiotropium, and the following Facility-Administered Medications: fentanyl and midazolam. Her primarily concern today is the Back Pain (Low left)  Initial Vital Signs:  Pulse/HCG Rate: 82ECG Heart Rate: 80 Temp: (!) 97.2 F (36.2 C) Resp: 18 BP: (!) 143/79 SpO2: 100 %  BMI: Estimated body mass index is 32.42 kg/m as calculated from the following:   Height as of this encounter: 5\' 7"  (1.702 m).   Weight as of this encounter: 207 lb (93.9 kg).  Risk Assessment: Allergies: Reviewed. She is allergic to percocet [oxycodone-acetaminophen] and ace inhibitors.  Allergy Precautions: None required Coagulopathies: Reviewed. None identified.  Blood-thinner therapy: None at this time Active Infection(s): Reviewed. None identified. Brandi Newton is afebrile  Site Confirmation: Brandi Newton was asked to confirm the procedure and laterality before marking the site Procedure checklist: Completed Consent: Before the procedure and under the influence of no sedative(s), amnesic(s), or anxiolytics, the patient was informed of the treatment options, risks and possible complications. To  fulfill our ethical and legal obligations, as recommended by the American Medical Association's Code of Ethics, I have informed the patient of my clinical impression; the nature and purpose of the treatment or procedure; the risks,  benefits, and possible complications of the intervention; the alternatives, including doing nothing; the risk(s) and benefit(s) of the alternative treatment(s) or procedure(s); and the risk(s) and benefit(s) of doing nothing. The patient was provided information about the general risks and possible complications associated with the procedure. These may include, but are not limited to: failure to achieve desired goals, infection, bleeding, organ or nerve damage, allergic reactions, paralysis, and death. In addition, the patient was informed of those risks and complications associated to Spine-related procedures, such as failure to decrease pain; infection (i.e.: Meningitis, epidural or intraspinal abscess); bleeding (i.e.: epidural hematoma, subarachnoid hemorrhage, or any other type of intraspinal or peri-dural bleeding); organ or nerve damage (i.e.: Any type of peripheral nerve, nerve root, or spinal cord injury) with subsequent damage to sensory, motor, and/or autonomic systems, resulting in permanent pain, numbness, and/or weakness of one or several areas of the body; allergic reactions; (i.e.: anaphylactic reaction); and/or death. Furthermore, the patient was informed of those risks and complications associated with the medications. These include, but are not limited to: allergic reactions (i.e.: anaphylactic or anaphylactoid reaction(s)); adrenal axis suppression; blood sugar elevation that in diabetics may result in ketoacidosis or comma; water retention that in patients with history of congestive heart failure may result in shortness of breath, pulmonary edema, and decompensation with resultant heart failure; weight gain; swelling or edema; medication-induced neural toxicity;  particulate matter embolism and blood vessel occlusion with resultant organ, and/or nervous system infarction; and/or aseptic necrosis of one or more joints. Finally, the patient was informed that Medicine is not an exact science; therefore, there is also the possibility of unforeseen or unpredictable risks and/or possible complications that may result in a catastrophic outcome. The patient indicated having understood very clearly. We have given the patient no guarantees and we have made no promises. Enough time was given to the patient to ask questions, all of which were answered to the patient's satisfaction. Ms. Deleo has indicated that she wanted to continue with the procedure. Attestation: I, the ordering provider, attest that I have discussed with the patient the benefits, risks, side-effects, alternatives, likelihood of achieving goals, and potential problems during recovery for the procedure that I have provided informed consent. Date  Time: 04/17/2021  8:37 AM  Pre-Procedure Preparation:  Monitoring: As per clinic protocol. Respiration, ETCO2, SpO2, BP, heart rate and rhythm monitor placed and checked for adequate function Safety Precautions: Patient was assessed for positional comfort and pressure points before starting the procedure. Time-out: I initiated and conducted the "Time-out" before starting the procedure, as per protocol. The patient was asked to participate by confirming the accuracy of the "Time Out" information. Verification of the correct person, site, and procedure were performed and confirmed by me, the nursing staff, and the patient. "Time-out" conducted as per Joint Commission's Universal Protocol (UP.01.01.01). Time: 0935  Description of Procedure:          Laterality: Bilateral. The procedure was performed in identical fashion on both sides. Levels:  L2, L3, L4, L5, & S1 Medial Branch Level(s) Area Prepped: Posterior Lumbosacral Region DuraPrep (Iodine Povacrylex [0.7%  available iodine] and Isopropyl Alcohol, 74% w/w) Safety Precautions: Aspiration looking for blood return was conducted prior to all injections. At no point did we inject any substances, as a needle was being advanced. Before injecting, the patient was told to immediately notify me if she was experiencing any new onset of "ringing in the ears, or metallic taste in the mouth". No attempts were made at seeking any  paresthesias. Safe injection practices and needle disposal techniques used. Medications properly checked for expiration dates. SDV (single dose vial) medications used. After the completion of the procedure, all disposable equipment used was discarded in the proper designated medical waste containers. Local Anesthesia: Protocol guidelines were followed. The patient was positioned over the fluoroscopy table. The area was prepped in the usual manner. The time-out was completed. The target area was identified using fluoroscopy. A 12-in long, straight, sterile hemostat was used with fluoroscopic guidance to locate the targets for each level blocked. Once located, the skin was marked with an approved surgical skin marker. Once all sites were marked, the skin (epidermis, dermis, and hypodermis), as well as deeper tissues (fat, connective tissue and muscle) were infiltrated with a small amount of a short-acting local anesthetic, loaded on a 10cc syringe with a 25G, 1.5-in  Needle. An appropriate amount of time was allowed for local anesthetics to take effect before proceeding to the next step. Local Anesthetic: Lidocaine 2.0% The unused portion of the local anesthetic was discarded in the proper designated containers. Technical explanation of process:  L2 Medial Branch Nerve Block (MBB): The target area for the L2 medial branch is at the junction of the postero-lateral aspect of the superior articular process and the superior, posterior, and medial edge of the transverse process of L3. Under fluoroscopic  guidance, a Quincke needle was inserted until contact was made with os over the superior postero-lateral aspect of the pedicular shadow (target area). After negative aspiration for blood, 0.5 mL of the nerve block solution was injected without difficulty or complication. The needle was removed intact. L3 Medial Branch Nerve Block (MBB): The target area for the L3 medial branch is at the junction of the postero-lateral aspect of the superior articular process and the superior, posterior, and medial edge of the transverse process of L4. Under fluoroscopic guidance, a Quincke needle was inserted until contact was made with os over the superior postero-lateral aspect of the pedicular shadow (target area). After negative aspiration for blood, 0.5 mL of the nerve block solution was injected without difficulty or complication. The needle was removed intact. L4 Medial Branch Nerve Block (MBB): The target area for the L4 medial branch is at the junction of the postero-lateral aspect of the superior articular process and the superior, posterior, and medial edge of the transverse process of L5. Under fluoroscopic guidance, a Quincke needle was inserted until contact was made with os over the superior postero-lateral aspect of the pedicular shadow (target area). After negative aspiration for blood, 0.5 mL of the nerve block solution was injected without difficulty or complication. The needle was removed intact. L5 Medial Branch Nerve Block (MBB): The target area for the L5 medial branch is at the junction of the postero-lateral aspect of the superior articular process and the superior, posterior, and medial edge of the sacral ala. Under fluoroscopic guidance, a Quincke needle was inserted until contact was made with os over the superior postero-lateral aspect of the pedicular shadow (target area). After negative aspiration for blood, 0.5 mL of the nerve block solution was injected without difficulty or complication. The  needle was removed intact. S1 Medial Branch Nerve Block (MBB): The target area for the S1 medial branch is at the posterior and inferior 6 o'clock position of the L5-S1 facet joint. Under fluoroscopic guidance, the Quincke needle inserted for the L5 MBB was redirected until contact was made with os over the inferior and postero aspect of the sacrum, at the 6  o' clock position under the L5-S1 facet joint (Target area). After negative aspiration for blood, 0.5 mL of the nerve block solution was injected without difficulty or complication. The needle was removed intact.  Nerve block solution: 0.2% PF-Ropivacaine + Triamcinolone (40 mg/mL) diluted to a final concentration of 4 mg of Triamcinolone/mL of Ropivacaine The unused portion of the solution was discarded in the proper designated containers. Procedural Needles: 22-gauge, 3.5-inch, Quincke needles used for all levels.  Once the entire procedure was completed, the treated area was cleaned, making sure to leave some of the prepping solution back to take advantage of its long term bactericidal properties.      Illustration of the posterior view of the lumbar spine and the posterior neural structures. Laminae of L2 through S1 are labeled. DPRL5, dorsal primary ramus of L5; DPRS1, dorsal primary ramus of S1; DPR3, dorsal primary ramus of L3; FJ, facet (zygapophyseal) joint L3-L4; I, inferior articular process of L4; LB1, lateral branch of dorsal primary ramus of L1; IAB, inferior articular branches from L3 medial branch (supplies L4-L5 facet joint); IBP, intermediate branch plexus; MB3, medial branch of dorsal primary ramus of L3; NR3, third lumbar nerve root; S, superior articular process of L5; SAB, superior articular branches from L4 (supplies L4-5 facet joint also); TP3, transverse process of L3.  Vitals:   04/17/21 0946 04/17/21 0957 04/17/21 1007 04/17/21 1017  BP: 124/72 125/61 116/65 126/61  Pulse:      Resp: 18 14 (!) 24 18  Temp:  (!) 97.2  F (36.2 C)  (!) 97.2 F (36.2 C)  TempSrc:  Temporal  Temporal  SpO2: 99% 94% 95% 94%  Weight:      Height:         Start Time: 0935 hrs. End Time:   hrs.  Imaging Guidance (Spinal):          Type of Imaging Technique: Fluoroscopy Guidance (Spinal) Indication(s): Assistance in needle guidance and placement for procedures requiring needle placement in or near specific anatomical locations not easily accessible without such assistance. Exposure Time: Please see nurses notes. Contrast: None used. Fluoroscopic Guidance: I was personally present during the use of fluoroscopy. "Tunnel Vision Technique" used to obtain the best possible view of the target area. Parallax error corrected before commencing the procedure. "Direction-depth-direction" technique used to introduce the needle under continuous pulsed fluoroscopy. Once target was reached, antero-posterior, oblique, and lateral fluoroscopic projection used confirm needle placement in all planes. Images permanently stored in EMR. Interpretation: No contrast injected. I personally interpreted the imaging intraoperatively. Adequate needle placement confirmed in multiple planes. Permanent images saved into the patient's record.  Antibiotic Prophylaxis:   Anti-infectives (From admission, onward)   Start     Dose/Rate Route Frequency Ordered Stop   04/17/21 0930  ceFAZolin (ANCEF) IVPB 1 g/50 mL premix        1 g 100 mL/hr over 30 Minutes Intravenous  Once 04/17/21 0922 04/17/21 0954     Indication(s): None identified  Post-operative Assessment:  Post-procedure Vital Signs:  Pulse/HCG Rate: 8269 Temp: (!) 97.2 F (36.2 C) Resp: 18 BP: 126/61 SpO2: 94 %  EBL: None  Complications: No immediate post-treatment complications observed by team, or reported by patient.  Note: The patient tolerated the entire procedure well. A repeat set of vitals were taken after the procedure and the patient was kept under observation following  institutional policy, for this type of procedure. Post-procedural neurological assessment was performed, showing return to baseline, prior to discharge. The patient  was provided with post-procedure discharge instructions, including a section on how to identify potential problems. Should any problems arise concerning this procedure, the patient was given instructions to immediately contact us, at any time, without hesitation. In any case, we plan to contact the patient by telephone for a follow-up status report regarding this interventional procedure.  Comments:  No additional relevant information.  Plan of Care  Orders:  Orders Placed This Encounter  Procedures  . LUMBAR FACET(MEDIAL BRANCH NERVE BLOCK) MBNB    Scheduling Instructions:     Procedure: Lumbar facet block (AKA.: Lumbosacral medial branch nerve block)     Side: Bilateral     Level: L3-4, L4-5, & L5-S1 Facets (L2, L3, L4, L5, & S1 Medial Branch Nerves)     Sedation: Patient's choice.     Timeframe: Today    Order Specific Question:   Where will this procedure be performed?    Answer:   [facility]  . [facility] C-ARM 1-60 MIN NO REPORT    Intraoperative interpretation by procedural physician at [place].    Standing Status:   Standing    Number of Occurrences:   1    Order Specific Question:   Reason for exam:    Answer:   Assistance in needle guidance and placement for procedures requiring needle placement in or near specific anatomical locations not easily accessible without such assistance.  . Informed Consent Details: Physician/Practitioner Attestation; Transcribe to consent form and obtain patient signature    Nursing Order: Transcribe to consent form and obtain patient signature. Note: Always confirm laterality of pain with Ms. Capobianco, before procedure.    Order Specific Question:   Physician/Practitioner attestation of informed consent for procedure/surgical case    Answer:   I, the  physician/practitioner, attest that I have discussed with the patient the benefits, risks, side effects, alternatives, likelihood of achieving goals and potential problems during recovery for the procedure that I have provided informed consent.    Order Specific Question:   Procedure    Answer:   Lumbar Facet Block  under fluoroscopic guidance    Order Specific Question:   Physician/Practitioner performing the procedure    Answer:   Hazelyn Kallen A. Dossie Arbour MD    Order Specific Question:   Indication/Reason    Answer:   Low Back Pain, with our without leg pain, due to Facet Joint Arthralgia (Joint Pain) Spondylosis (Arthritis of the Spine), without myelopathy or radiculopathy (Nerve Damage).  . Provide equipment / supplies at bedside    "Block Tray" (Disposable  single use) Needle type: SpinalSpinal Amount/quantity: 4 Size: Medium (5-inch) Gauge: 22G    Standing Status:   Standing    Number of Occurrences:   1    Order Specific Question:   Specify    Answer:   Block Tray   Chronic Opioid Analgesic:  None MME/day: 0 mg/day   Medications ordered for procedure: Meds ordered this encounter  Medications  . lidocaine (XYLOCAINE) 2 % (with pres) injection 400 mg  . lactated ringers infusion 1,000 mL  . midazolam (VERSED) 5 MG/5ML injection 1-2 mg    Make sure Flumazenil is available in the pyxis when using this medication. If oversedation occurs, administer 0.2 mg IV over 15 sec. If after 45 sec no response, administer 0.2 mg again over 1 min; may repeat at 1 min intervals; not to exceed 4 doses (1 mg)  . fentaNYL (SUBLIMAZE) injection 25-50 mcg    Make sure Narcan is  available in the pyxis when using this medication. In the event of respiratory depression (RR< 8/min): Titrate NARCAN (naloxone) in increments of 0.1 to 0.2 mg IV at 2-3 minute intervals, until desired degree of reversal.  . ropivacaine (PF) 2 mg/mL (0.2%) (NAROPIN) injection 18 mL  . triamcinolone acetonide (KENALOG-40) injection  80 mg  . ceFAZolin (ANCEF) IVPB 1 g/50 mL premix    Order Specific Question:   Antibiotic Indication:    Answer:   Surgical Prophylaxis    Order Specific Question:   Other Indication:    Answer:   Surgical Prophylaxis   Medications administered: We administered lidocaine, lactated ringers, midazolam, fentaNYL, ropivacaine (PF) 2 mg/mL (0.2%), triamcinolone acetonide, and ceFAZolin.  See the medical record for exact dosing, route, and time of administration.  Follow-up plan:   Return in about 2 weeks (around 05/01/2021) for afternoon of procedure day, (F2F), (PPE).       Interventional Therapies  Risk  Complexity Considerations:   Estimated body mass index is 32.11 kg/m as calculated from the following:   Height as of this encounter: 5\' 7"  (1.702 m).   Weight as of this encounter: 205 lb (93 kg). WNL   Planned  Pending:   Diagnostic bilateral lumbar facet block #1    Under consideration:   Diagnostic bilateral lumbar facet block #1  Diagnostic left sacroiliac joint block #1    Completed:   Diagnostic/therapeutic bilateral lumbar (PSIS) trigger point injections x1(03/20/2021) (20/30/70/70)   Therapeutic  Palliative (PRN) options:   None established     Recent Visits Date Type Provider Dept  04/08/21 Office Visit Milinda Pointer, MD [facility]  03/20/21 Procedure visit Milinda Pointer, MD [facility]  03/19/21 Office Visit Milinda Pointer, MD [facility]  Showing recent visits within past 90 days and meeting all other requirements Today's Visits Date Type Provider Dept  04/17/21 Procedure visit Milinda Pointer, MD [facility]  Showing today's visits and meeting all other requirements Future Appointments Date Type Provider Dept  05/28/21 Appointment Milinda Pointer, MD [facility]  Showing future appointments within next 90 days and meeting all other requirements  Disposition: Discharge home  Discharge  (Date  Time): 04/17/2021; 1021 hrs.   Primary Care Physician: Ranae Plumber, PA Location: [facility] Note by: Gaspar Cola, MD Date: 04/17/2021; Time: 12:11 PM  Disclaimer:  Medicine is not an Chief Strategy Officer. The only guarantee in medicine is that nothing is guaranteed. It is important to note that the decision to proceed with this intervention was based on the information collected from the patient. The Data and conclusions were drawn from the patient's questionnaire, the interview, and the physical examination. Because the information was provided in large part by the patient, it cannot be guaranteed that it has not been purposely or unconsciously manipulated. Every effort has been made to obtain as much relevant data as possible for this evaluation. It is important to note that the conclusions that lead to this procedure are derived in large part from the available data. Always take into account that the treatment will also be dependent on availability of resources and existing treatment guidelines, considered by other Pain Management Practitioners as being common knowledge and practice, at the time of the intervention. For Medico-Legal purposes, it is also important to point out that variation in procedural techniques and pharmacological choices are the acceptable norm. The indications, contraindications, technique, and results of the above procedure should only be interpreted and judged  by a Board-Certified Interventional Pain Specialist with extensive familiarity and expertise in the same exact procedure and technique.

## 2021-04-17 ENCOUNTER — Encounter: Payer: Self-pay | Admitting: Pain Medicine

## 2021-04-17 ENCOUNTER — Ambulatory Visit
Admission: RE | Admit: 2021-04-17 | Discharge: 2021-04-17 | Disposition: A | Discharge status: Home / Self Care | Payer: Medicare PPO | Source: Ambulatory Visit | Attending: Pain Medicine | Admitting: Pain Medicine

## 2021-04-17 ENCOUNTER — Other Ambulatory Visit: Payer: Self-pay

## 2021-04-17 ENCOUNTER — Ambulatory Visit (HOSPITAL_BASED_OUTPATIENT_CLINIC_OR_DEPARTMENT_OTHER): Payer: Medicare PPO | Admitting: Pain Medicine

## 2021-04-17 VITALS — BP 126/61 | HR 82 | Temp 97.2°F | Resp 18 | Ht 67.0 in | Wt 207.0 lb

## 2021-04-17 DIAGNOSIS — M4310 Spondylolisthesis, site unspecified: Secondary | ICD-10-CM | POA: Diagnosis present

## 2021-04-17 DIAGNOSIS — R937 Abnormal findings on diagnostic imaging of other parts of musculoskeletal system: Secondary | ICD-10-CM

## 2021-04-17 DIAGNOSIS — M47817 Spondylosis without myelopathy or radiculopathy, lumbosacral region: Secondary | ICD-10-CM | POA: Diagnosis present

## 2021-04-17 DIAGNOSIS — Z96653 Presence of artificial knee joint, bilateral: Secondary | ICD-10-CM | POA: Diagnosis present

## 2021-04-17 DIAGNOSIS — M47816 Spondylosis without myelopathy or radiculopathy, lumbar region: Secondary | ICD-10-CM | POA: Diagnosis not present

## 2021-04-17 DIAGNOSIS — G8929 Other chronic pain: Secondary | ICD-10-CM | POA: Insufficient documentation

## 2021-04-17 DIAGNOSIS — M5137 Other intervertebral disc degeneration, lumbosacral region: Secondary | ICD-10-CM | POA: Insufficient documentation

## 2021-04-17 DIAGNOSIS — M545 Low back pain, unspecified: Secondary | ICD-10-CM

## 2021-04-17 MED ORDER — MIDAZOLAM HCL 5 MG/5ML IJ SOLN
INTRAMUSCULAR | Status: AC
  Filled 2021-04-17: qty 5

## 2021-04-17 MED ORDER — LACTATED RINGERS IV SOLN
1000.0000 mL | Freq: Once | INTRAVENOUS | Status: AC
  Administered 2021-04-17: 1000 mL via INTRAVENOUS

## 2021-04-17 MED ORDER — LIDOCAINE HCL 2 % IJ SOLN
INTRAMUSCULAR | Status: AC
  Filled 2021-04-17: qty 20

## 2021-04-17 MED ORDER — ROPIVACAINE HCL 2 MG/ML IJ SOLN
INTRAMUSCULAR | Status: AC
  Filled 2021-04-17: qty 20

## 2021-04-17 MED ORDER — LIDOCAINE HCL 2 % IJ SOLN
20.0000 mL | Freq: Once | INTRAMUSCULAR | Status: AC
Start: 2021-04-17 — End: 2021-04-17
  Administered 2021-04-17: 400 mg

## 2021-04-17 MED ORDER — MIDAZOLAM HCL 5 MG/5ML IJ SOLN
1.0000 mg | INTRAMUSCULAR | Status: DC | PRN
  Administered 2021-04-17: 2 mg via INTRAVENOUS

## 2021-04-17 MED ORDER — FENTANYL CITRATE (PF) 100 MCG/2ML IJ SOLN
25.0000 ug | INTRAMUSCULAR | Status: DC | PRN
Start: 2021-04-17 — End: 2021-04-17
  Administered 2021-04-17: 50 ug via INTRAVENOUS

## 2021-04-17 MED ORDER — TRIAMCINOLONE ACETONIDE 40 MG/ML IJ SUSP
INTRAMUSCULAR | Status: AC
  Filled 2021-04-17: qty 2

## 2021-04-17 MED ORDER — CEFAZOLIN SODIUM 1 G IJ SOLR
INTRAMUSCULAR | Status: AC
  Filled 2021-04-17: qty 10

## 2021-04-17 MED ORDER — TRIAMCINOLONE ACETONIDE 40 MG/ML IJ SUSP
80.0000 mg | Freq: Once | INTRAMUSCULAR | Status: AC
  Administered 2021-04-17: 80 mg

## 2021-04-17 MED ORDER — FENTANYL CITRATE (PF) 100 MCG/2ML IJ SOLN
INTRAMUSCULAR | Status: AC
  Filled 2021-04-17: qty 2

## 2021-04-17 MED ORDER — ROPIVACAINE HCL 2 MG/ML IJ SOLN
18.0000 mL | Freq: Once | INTRAMUSCULAR | Status: AC
  Administered 2021-04-17: 18 mL via PERINEURAL

## 2021-04-17 MED ORDER — CEFAZOLIN SODIUM-DEXTROSE 1-4 GM/50ML-% IV SOLN
1.0000 g | Freq: Once | INTRAVENOUS | Status: AC
  Administered 2021-04-17: 1 g via INTRAVENOUS

## 2021-04-17 NOTE — Patient Instructions (Signed)

## 2021-04-17 NOTE — Progress Notes (Signed)
Safety precautions to be maintained throughout the outpatient stay will include: orient to surroundings, keep bed in low position, maintain call bell within reach at all times, provide assistance with transfer out of bed and ambulation.  

## 2021-04-18 ENCOUNTER — Telehealth: Payer: Self-pay

## 2021-04-18 NOTE — Telephone Encounter (Signed)
Patient called and stated she was doing well today with minimal pain.

## 2021-05-27 NOTE — Progress Notes (Signed)
PROVIDER NOTE: Information contained herein reflects review and annotations entered in association with encounter. Interpretation of such information and data should be left to medically-trained personnel. Information provided to patient can be located elsewhere in the medical record under "Patient Instructions". Document created using STT-dictation technology, any transcriptional errors that may result from process are unintentional.    Patient: Brandi Newton  Service Category: E/M  Provider: Gaspar Cola, MD  DOB: 1945-12-29  DOS: 05/28/2021  Specialty: Interventional Pain Management  MRN: [id number]  Setting: Ambulatory outpatient  PCP: Ranae Plumber, PA  Type: Established Patient    Referring Provider: Ranae Plumber, PA  Location: Office  Delivery: Face-to-face     HPI  Ms. Brandi Newton, a 75 y.o. year old female, is here today because of her Chronic left sacroiliac joint pain [M53.3, G89.29]. Ms. Kanzler primary complain today is No chief complaint on file. Last encounter: My last encounter with her was on 04/17/2021. Pertinent problems: Ms. 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); Chronic upper back pain (2ry area of Pain) (Bilateral) (R>L); Chronic lower extremity pain (3ry area of Pain) (Bilateral); Lumbar facet syndrome (Bilateral); Chronic knee pain after total replacement (4th area of Pain) (Bilateral) (L>R); Chronic sacroiliac joint pain (Bilateral) (L>R); Abnormal lower extremity EMG  (electromyogram) (01/26/2018); Abnormal upper extremity NCS (nerve conduction studies) (01/24/2018); History of total knee replacement (Bilateral); Lumbar Grade 1 Retrolisthesis of L3/L4; Lumbar facet hypertrophy (Multilevel) (Bilateral); Chronic sacroiliac joint pain (Left); Enthesopathy of sacroiliac joint (Left); Spondylosis without myelopathy or radiculopathy, lumbosacral region; 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 Pain Assessment: Severity of Chronic pain is reported as a 2 /10. Location: Back Lower, Left/carmping in the left leg in the calf area and into the foot.. Onset: More than a month ago. Quality: Discomfort, Constant, Dull, Cramping. Timing: Constant. Modifying factor(s): rest. Vitals:  height is 5' 7" (1.702 m) and weight is 207 lb (93.9 kg). Her temporal temperature is 97.2 F (36.2 C) (abnormal). Her blood pressure is 131/89 and her pulse is 75. Her respiration is 16 and oxygen saturation is 100%.   Reason for encounter: post-procedure assessment.  According to the patient she attained excellent relief of the pain with the bilateral lumbar facet block.  She refers having attained 100% relief of the pain for the duration of the local anesthetic which then lasted for another 3 to 4 days.  The pain now seems to come and go on the left side, but the right side is doing great.  Physical exam today shows the patient to have some remaining pain in the area of the left sacroiliac joint as shown on the Patrick maneuver which was also positive for decreased range of motion of the left hip as well as being positive for left hip and left sacroiliac joint arthralgia.  Today I took the time to review the patient's  imaging and there is nothing about her hip  and therefore today we will be ordering some x-rays of that hip to see why she has the pain and the decreased range of motion.  With regards to the pain in the back, she refers that the SI joint pain seems to be worse than the hip pain and therefore we will be scheduling her to return for a diagnostic sacroiliac joint injection first.  Once we get the results of that hip x-rays, depending on what she needs, we may consider treating that pain with an intra-articular injection.  The plan was shared with the patient who understood and accepted.  Post-Procedure Evaluation  Procedure (04/17/2021): Diagnostic bilateral lumbar facet block #1 under fluoroscopic guidance and IV sedation Pre-procedure pain level: 7/10 Post-procedure: 0/10 (100% relief)  Sedation: Sedation provided.  Effectiveness during initial hour after procedure(Ultra-Short Term Relief): 100 %.  Local anesthetic used: Long-acting (4-6 hours) Effectiveness: Defined as any analgesic benefit obtained secondary to the administration of local anesthetics. This carries significant diagnostic value as to the etiological location, or anatomical origin, of the pain. Duration of benefit is expected to coincide with the duration of the local anesthetic used.  Effectiveness during initial 4-6 hours after procedure(Short-Term Relief): 100 %.  Long-term benefit: Defined as any relief past the pharmacologic duration of the local anesthetics.  Effectiveness past the initial 6 hours after procedure(Long-Term Relief): 100 % (good pain relief x 3 - 4 days and now the pain comes and goes.  right seems to more improved than the left.).  Current benefits: Defined as benefit that persist at this time.   Analgesia:   The patient indicates continuing to enjoying an ongoing 100% relief of the pain on the right side, but on the left side the pain is beginning to come back.  Physical exam today, as shown above, was also  positive for left hip and left sacroiliac joint arthralgia, which is likely to be the pain that remains. Function: Ms. Alamo reports improvement in function ROM: Ms. Schoonmaker reports improvement in ROM   Pharmacotherapy Assessment  Analgesic: None MME/day: 0 mg/day   Monitoring: [place] PMP: PDMP reviewed during this encounter.       Pharmacotherapy: No side-effects or adverse reactions reported. Compliance: No problems identified. Effectiveness: Clinically acceptable.  Patterson, Delores G, RN  05/28/2021  3:11 PM  Sign when Signing Visit Safety precautions to be maintained throughout the outpatient stay will include: orient to surroundings, keep bed in low position, maintain call bell within reach at all times, provide assistance with transfer out of bed and ambulation.     UDS:  Summary  Date Value Ref Range Status  03/19/2021 Note  Final    Comment:    ==================================================================== Compliance Drug Analysis, Ur ==================================================================== Test                             Result       Flag       Units  Drug Present and Declared for Prescription Verification   Gabapentin                     PRESENT      EXPECTED   Citalopram                     PRESENT      EXPECTED   Desmethylcitalopram            PRESENT      EXPECTED      Desmethylcitalopram is an expected metabolite of citalopram or the    enantiomeric form, escitalopram.    Acetaminophen                  PRESENT      EXPECTED  Drug Present not Declared for Prescription Verification   Dextromethorphan               PRESENT      UNEXPECTED   Dextrorphan/Levorphanol        PRESENT      UNEXPECTED    Dextrorphan is an expected metabolite of dextromethorphan, an over-    the-counter or prescription cough suppressant. Dextrorphan cannot be    distinguished from the scheduled prescription medication levorphanol    by the method used for  analysis.  ==================================================================== Test                      Result    Flag   Units      Ref Range   Creatinine              91               mg/dL      >=20 ==================================================================== Declared Medications:  The flagging and interpretation on this report are based on the  following declared medications.  Unexpected results may arise from  inaccuracies in the declared medications.   **Note: The testing scope of this panel includes these medications:   Escitalopram (Lexapro)  Gabapentin (Neurontin)   **Note: The testing scope of this panel does not include small to  moderate amounts of these reported medications:   Acetaminophen (Tylenol)   **Note: The testing scope of this panel does not include the  following reported medications:   Albuterol (Ventolin HFA)  Atorvastatin (Lipitor)  Benzonatate (Tessalon)  Cetirizine (Zyrtec)  Cyanocobalamin  Fluticasone (Flonase)  Hydrochlorothiazide (Hydrodiuril)  Losartan (Cozaar)  Polyethylene Glycol (MiraLAX)  Tiotropium (Spiriva)  Vitamin D ==================================================================== For clinical consultation, please call [phone]. ====================================================================      ROS  Constitutional: Denies any fever or chills Gastrointestinal: No reported hemesis, hematochezia, vomiting, or acute GI distress Musculoskeletal: Denies any acute onset joint swelling, redness, loss of ROM, or weakness Neurological: No reported episodes of acute onset apraxia, aphasia, dysarthria, agnosia, amnesia, paralysis, loss of coordination, or loss of consciousness  Medication Review  acetaminophen, albuterol, atorvastatin, benzonatate, cetirizine, chlorthalidone, cholecalciferol, cyanocobalamin, escitalopram, fluticasone, gabapentin, losartan, and polyethylene glycol  History Review  Allergy: Ms.  Haley is allergic to percocet [oxycodone-acetaminophen] and ace inhibitors. Drug: Ms. Carrara  reports no history of drug use. Alcohol:  reports current alcohol use. Tobacco:  reports that she quit smoking about 42 years ago. Her smoking use included cigarettes. She has a 10.00 pack-year smoking history. She has never used smokeless tobacco. Social: Ms. Venning  reports that she quit smoking about 42 years ago. Her smoking use included cigarettes. She has a 10.00 pack-year smoking history. She has never used smokeless tobacco. She reports current alcohol use. She reports that she does not use drugs. Medical:  has a past medical history of Anemia, Arthritis, Asthma, Bilateral ovarian cysts, Bronchitis, Constipation, Family history of adverse reaction to anesthesia, GERD (gastroesophageal reflux disease), H/O seasonal allergies, History of kidney stones, Hypertension, PONV (postoperative nausea and vomiting), and Sinus problem. Surgical: Ms. Weddington  has a past surgical history that includes nodules vocal cords; back (Bilateral); Laparoscopic hysterectomy (Bilateral, 10/03/2015); Tubal ligation (1984); Cataract extraction; Anterior cervical decomp/discectomy fusion (  N/A, 05/11/2018); Breast excisional biopsy (Right, 1975?); Total knee arthroplasty (Right, 04/27/2019); Back surgery (85 or 86); and Total knee arthroplasty (Left, 05/14/2020). Family: family history includes Heart disease in her father and mother; Skin cancer in her father.  Laboratory Chemistry Profile   Renal Lab Results  Component Value Date   BUN 23 03/19/2021   CREATININE 1.40 (H) 03/19/2021   BCR 16 03/19/2021   GFRAA 39 (L) 05/15/2020   GFRNONAA 34 (L) 05/15/2020     Hepatic Lab Results  Component Value Date   AST 16 03/19/2021   ALT 17 05/03/2020   ALBUMIN 4.2 03/19/2021   ALKPHOS 96 03/19/2021   LIPASE 34 08/28/2015     Electrolytes Lab Results  Component Value Date   NA 141 03/19/2021   K 3.9 03/19/2021   CL 102  03/19/2021   CALCIUM 10.2 03/19/2021   MG 1.9 03/19/2021     Bone Lab Results  Component Value Date   25OHVITD1 55 03/19/2021   25OHVITD2 <1.0 03/19/2021   25OHVITD3 55 03/19/2021     Inflammation (CRP: Acute Phase) (ESR: Chronic Phase) Lab Results  Component Value Date   CRP 18 (H) 03/19/2021   ESRSEDRATE 24 03/19/2021       Note: Above Lab results reviewed.  Recent Imaging Review  DG PAIN CLINIC C-ARM 1-60 MIN NO REPORT Fluoro was used, but no Radiologist interpretation will be provided.  Please refer to "NOTES" tab for provider progress note. Note: Reviewed        Physical Exam  General appearance: Well nourished, well developed, and well hydrated. In no apparent acute distress Mental status: Alert, oriented x 3 (person, place, & time)       Respiratory: No evidence of acute respiratory distress Eyes: PERLA Vitals: BP 131/89 (BP Location: Right Arm, Patient Position: Sitting, Cuff Size: Large)   Pulse 75   Temp (!) 97.2 F (36.2 C) (Temporal)   Resp 16   Ht 5' 7" (1.702 m)   Wt 207 lb (93.9 kg)   SpO2 100%   BMI 32.42 kg/m  BMI: Estimated body mass index is 32.42 kg/m as calculated from the following:   Height as of this encounter: 5' 7" (1.702 m).   Weight as of this encounter: 207 lb (93.9 kg). Ideal: Ideal body weight: 61.6 kg (135 lb 12.9 oz) Adjusted ideal body weight: 74.5 kg (164 lb 4.5 oz)  Assessment   Status Diagnosis  Controlled Controlled Controlled 1. Chronic sacroiliac joint pain (Left)   2. Chronic hip pain (Left)   3. Decreased range of left hip movement      Updated Problems: Problem  Chronic hip pain (Left)  Decreased Range of Left Hip Movement    Plan of Care  Problem-specific:  No problem-specific Assessment & Plan notes found for this encounter.  Ms. Sheritta P Kristensen has a current medication list which includes the following long-term medication(s): albuterol, atorvastatin, cetirizine, chlorthalidone, escitalopram,  fluticasone, gabapentin, and losartan.  Pharmacotherapy (Medications Ordered): No orders of the defined types were placed in this encounter.  Orders:  Orders Placed This Encounter  Procedures   SACROILIAC JOINT INJECTION    Standing Status:   Future    Standing Expiration Date:   06/27/2021    Scheduling Instructions:     Side: Left-sided     Sedation: Patient's choice.     Timeframe: ASAP    Order Specific Question:   Where will this procedure be performed?    Answer:   [facility]   DG HIP UNILAT W OR W/O PELVIS 2-3 VIEWS LEFT    Please describe any evidence of DJD, such as joint narrowing, asymmetry, cysts, or any anomalies in bone density, production, or erosion.    Standing Status:   Future    Number of Occurrences:   1    Standing Expiration Date:   06/27/2021    Scheduling Instructions:     Imaging must be done as soon as possible. Inform patient that order will expire within 30 days and I will not renew it.    Order Specific Question:   Reason for Exam (SYMPTOM  OR DIAGNOSIS REQUIRED)    Answer:   Left hip pain/arthralgia    Order Specific Question:   Preferred imaging location?    Answer:   [facility]    Order Specific Question:   Call Results- Best Contact Number?    Answer:   [phone] [facility])    Follow-up plan:   Return for Procedure (no sedation): (L) SI BLK #1.     Interventional Therapies  Risk  Complexity Considerations:   Estimated body mass index is 32.11 kg/m as calculated from the following:   Height as of this encounter: 5' 7" (1.702 m).   Weight as of this encounter: 205 lb (93 kg). WNL   Planned  Pending:   Diagnostic bilateral lumbar facet block #1    Under consideration:   Diagnostic bilateral lumbar facet block #2  Diagnostic left sacroiliac joint block #1    Completed:   Diagnostic/therapeutic bilateral lumbar (PSIS) trigger point injections x1 (03/20/2021) (20/30/70/70)  Diagnostic bilateral lumbar facet  block x1 (04/17/2021) (100/100/100)    Therapeutic  Palliative (PRN) options:   None established      Recent Visits Date Type Provider Dept  04/17/21 Procedure visit Milinda Pointer, MD [facility]  04/08/21 Office Visit Milinda Pointer, MD [facility]  03/20/21 Procedure visit Milinda Pointer, MD [facility]  03/19/21 Office Visit Milinda Pointer, MD [facility]  Showing recent visits within past 90 days and meeting all other requirements Today's Visits Date Type Provider Dept  05/28/21 Office Visit Milinda Pointer, MD [facility]  Showing today's visits and meeting all other requirements Future Appointments No visits were found meeting these conditions. Showing future appointments within next 90 days and meeting all other requirements I discussed the assessment and treatment plan with the patient. The patient was provided an opportunity to ask questions and all were answered. The patient agreed with the plan and demonstrated an understanding of the instructions.  Patient advised to call back or seek an in-person evaluation if the symptoms or condition worsens.  Duration of encounter: 35 minutes.  Note by: Gaspar Cola, MD Date: 05/28/2021; Time: 6:31 PM

## 2021-05-28 ENCOUNTER — Encounter: Payer: Self-pay | Admitting: Pain Medicine

## 2021-05-28 ENCOUNTER — Ambulatory Visit
Admission: RE | Admit: 2021-05-28 | Discharge: 2021-05-28 | Disposition: A | Discharge status: Home / Self Care | Payer: Medicare PPO | Source: Ambulatory Visit | Attending: Pain Medicine | Admitting: Pain Medicine

## 2021-05-28 ENCOUNTER — Other Ambulatory Visit: Payer: Self-pay

## 2021-05-28 ENCOUNTER — Ambulatory Visit (HOSPITAL_BASED_OUTPATIENT_CLINIC_OR_DEPARTMENT_OTHER): Payer: Medicare PPO | Admitting: Pain Medicine

## 2021-05-28 VITALS — BP 131/89 | HR 75 | Temp 97.2°F | Resp 16 | Ht 67.0 in | Wt 207.0 lb

## 2021-05-28 DIAGNOSIS — G8929 Other chronic pain: Secondary | ICD-10-CM | POA: Insufficient documentation

## 2021-05-28 DIAGNOSIS — M25552 Pain in left hip: Secondary | ICD-10-CM | POA: Insufficient documentation

## 2021-05-28 DIAGNOSIS — M533 Sacrococcygeal disorders, not elsewhere classified: Secondary | ICD-10-CM | POA: Insufficient documentation

## 2021-05-28 DIAGNOSIS — M25652 Stiffness of left hip, not elsewhere classified: Secondary | ICD-10-CM | POA: Insufficient documentation

## 2021-05-28 NOTE — Patient Instructions (Signed)
____________________________________________________________________________________________  General Risks and Possible Complications  Patient Responsibilities: It is important that you read this as it is part of your informed consent. It is our duty to inform you of the risks and possible complications associated with treatments offered to you. It is your responsibility as a patient to read this and to ask questions about anything that is not clear or that you believe was not covered in this document.  Patient's Rights: You have the right to refuse treatment. You also have the right to change your mind, even after initially having agreed to have the treatment done. However, under this last option, if you wait until the last second to change your mind, you may be charged for the materials used up to that point.  Introduction: Medicine is not an exact science. Everything in Medicine, including the lack of treatment(s), carries the potential for danger, harm, or loss (which is by definition: Risk). In Medicine, a complication is a secondary problem, condition, or disease that can aggravate an already existing one. All treatments carry the risk of possible complications. The fact that a side effects or complications occurs, does not imply that the treatment was conducted incorrectly. It must be clearly understood that these can happen even when everything is done following the highest safety standards.  No treatment: You can choose not to proceed with the proposed treatment alternative. The "PRO(s)" would include: avoiding the risk of complications associated with the therapy. The "CON(s)" would include: not getting any of the treatment benefits. These benefits fall under one of three categories: diagnostic; therapeutic; and/or palliative. Diagnostic benefits include: getting information which can ultimately lead to improvement of the disease or symptom(s). Therapeutic benefits are those associated with the  successful treatment of the disease. Finally, palliative benefits are those related to the decrease of the primary symptoms, without necessarily curing the condition (example: decreasing the pain from a flare-up of a chronic condition, such as incurable terminal cancer).  General Risks and Complications: These are associated to most interventional treatments. They can occur alone, or in combination. They fall under one of the following six (6) categories: no benefit or worsening of symptoms; bleeding; infection; nerve damage; allergic reactions; and/or death. No benefits or worsening of symptoms: In Medicine there are no guarantees, only probabilities. No healthcare provider can ever guarantee that a medical treatment will work, they can only state the probability that it may. Furthermore, there is always the possibility that the condition may worsen, either directly, or indirectly, as a consequence of the treatment. Bleeding: This is more common if the patient is taking a blood thinner, either prescription or over the counter (example: Goody Powders, Fish oil, Aspirin, Garlic, etc.), or if suffering a condition associated with impaired coagulation (example: Hemophilia, cirrhosis of the liver, low platelet counts, etc.). However, even if you do not have one on these, it can still happen. If you have any of these conditions, or take one of these drugs, make sure to notify your treating physician. Infection: This is more common in patients with a compromised immune system, either due to disease (example: diabetes, cancer, human immunodeficiency virus [HIV], etc.), or due to medications or treatments (example: therapies used to treat cancer and rheumatological diseases). However, even if you do not have one on these, it can still happen. If you have any of these conditions, or take one of these drugs, make sure to notify your treating physician. Nerve Damage: This is more common when the treatment is an invasive    one, but it can also happen with the use of medications, such as those used in the treatment of cancer. The damage can occur to small secondary nerves, or to large primary ones, such as those in the spinal cord and brain. This damage may be temporary or permanent and it may lead to impairments that can range from temporary numbness to permanent paralysis and/or brain death. Allergic Reactions: Any time a substance or material comes in contact with our body, there is the possibility of an allergic reaction. These can range from a mild skin rash (contact dermatitis) to a severe systemic reaction (anaphylactic reaction), which can result in death. Death: In general, any medical intervention can result in death, most of the time due to an unforeseen complication. ____________________________________________________________________________________________ Sacroiliac (SI) Joint Injection Patient Information  Description: The sacroiliac joint connects the scrum (very low back and tailbone) to the ilium (a pelvic bone which also forms half of the hip joint).  Normally this joint experiences very little motion.  When this joint becomes inflamed or unstable low back and or hip and pelvis pain may result.  Injection of this joint with local anesthetics (numbing medicines) and steroids can provide diagnostic information and reduce pain.  This injection is performed with the aid of x-ray guidance into the tailbone area while you are lying on your stomach.   You may experience an electrical sensation down the leg while this is being done.  You may also experience numbness.  We also may ask if we are reproducing your normal pain during the injection.  Conditions which may be treated SI injection:  Low back, buttock, hip or leg pain  Preparation for the Injection:  Do not eat any solid food or dairy products within 8 hours of your appointment.  You may drink clear liquids up to 3 hours before appointment.  Clear  liquids include water, black coffee, juice or soda.  No milk or cream please. You may take your regular medications, including pain medications with a sip of water before your appointment.  Diabetics should hold regular insulin (if take separately) and take 1/2 normal NPH dose the morning of the procedure.  Carry some sugar containing items with you to your appointment. A driver must accompany you and be prepared to drive you home after your procedure. Bring all of your current medications with you. An IV may be inserted and sedation may be given at the discretion of the physician. A blood pressure cuff, EKG and other monitors will often be applied during the procedure.  Some patients may need to have extra oxygen administered for a short period.  You will be asked to provide medical information, including your allergies, prior to the procedure.  We must know immediately if you are taking blood thinners (like Coumadin/Warfarin) or if you are allergic to IV iodine contrast (dye).  We must know if you could possible be pregnant.  Possible side effects:  Bleeding from needle site Infection (rare, may require surgery) Nerve injury (rare) Numbness & tingling (temporary) A brief convulsion or seizure Light-headedness (temporary) Pain at injection site (several days) Decreased blood pressure (temporary) Weakness in the leg (temporary)   Call if you experience:  New onset weakness or numbness of an extremity below the injection site that last more than 8 hours. Hives or difficulty breathing ( go to the emergency room) Inflammation or drainage at the injection site Any new symptoms which are concerning to you  Please note:  Although the local anesthetic   injected can often make your back/ hip/ buttock/ leg feel good for several hours after the injections, the pain will likely return.  It takes 3-7 days for steroids to work in the sacroiliac area.  You may not notice any pain relief for at least  that one week.  If effective, we will often do a series of three injections spaced 3-6 weeks apart to maximally decrease your pain.  After the initial series, we generally will wait some months before a repeat injection of the same type.  If you have any questions, please call [phone] [facility]   

## 2021-05-28 NOTE — Progress Notes (Signed)
Safety precautions to be maintained throughout the outpatient stay will include: orient to surroundings, keep bed in low position, maintain call bell within reach at all times, provide assistance with transfer out of bed and ambulation.  

## 2021-06-18 NOTE — Progress Notes (Signed)
PROVIDER NOTE: Information contained herein reflects review and annotations entered in association with encounter. Interpretation of such information and data should be left to medically-trained personnel. Information provided to patient can be located elsewhere in the medical record under "Patient Instructions". Document created using STT-dictation technology, any transcriptional errors that may result from process are unintentional.    Patient: Brandi Newton  Service Category: Procedure  Provider: Gaspar Cola, MD  DOB: 1946/03/06  DOS: 06/19/2021  Location: [facility]  MRN: [id number]  Setting: Ambulatory - outpatient  Referring Provider: Ranae Plumber, PA  Type: Established Patient  Specialty: Interventional Pain Management  PCP: Ranae Plumber, [place]   Primary Reason for Visit: Interventional Pain Management Treatment. CC: Back Pain (Left, lower)  Procedure:          Anesthesia, Analgesia, Anxiolysis:  Type: Diagnostic Sacroiliac Joint Steroid Injection          Region: Superior Lumbosacral Region Level: PSIS (Posterior Superior Iliac Spine) Laterality: Left  Type: Local Anesthesia Indication(s): Analgesia         Route: Infiltration ([place]/IM) IV Access: Declined Sedation: Declined  Local Anesthetic: Lidocaine 1-2%  Position: Prone           Indications: 1. Chronic sacroiliac joint pain (Left)   2. Enthesopathy of sacroiliac joint (Left)   3. Other spondylosis, sacral and sacrococcygeal region    Pain Score: Pre-procedure: 4 /10 Post-procedure: 0-No pain/10   Pre-op H&P Assessment:  Brandi Newton is a 75 y.o. (year old), female patient, seen today for interventional treatment. 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 Brandi Newton has a current medication list which includes the following prescription(s): acetaminophen, albuterol, atorvastatin, benzonatate, cetirizine, chlorthalidone, cholecalciferol, cyanocobalamin, escitalopram, fluticasone, losartan, polyethylene glycol, and gabapentin. Her primarily concern today is the Back Pain (Left, lower)  Initial Vital Signs:  Pulse/HCG Rate: 67  Temp: (!) 97.3 F (36.3 C) Resp: 16 BP: (!) 147/72 SpO2: 98 %  BMI: Estimated body mass index is 31.83 kg/m as calculated from the following:   Height as of this encounter: 5\' 7"  (1.702 m).   Weight as of this encounter: 203 lb 3.2 oz (92.2 kg).  Risk Assessment: Allergies: Reviewed. She is allergic to percocet [oxycodone-acetaminophen] and ace inhibitors.  Allergy Precautions: None required Coagulopathies: Reviewed. None identified.  Blood-thinner therapy: None at this time Active Infection(s): Reviewed. None identified. Brandi Newton is afebrile  Site Confirmation: Brandi Newton was asked to confirm the procedure and laterality before marking the site Procedure checklist: Completed Consent: Before the procedure and under the influence of no sedative(s), amnesic(s), or anxiolytics, the patient was informed of the treatment options, risks and possible complications. To fulfill our ethical and legal obligations, as recommended by the American Medical Association's Code of Ethics, I have informed the patient of my clinical impression; the nature and purpose of the treatment or procedure; the risks, benefits, and possible complications of the intervention; the alternatives, including doing nothing; the risk(s) and benefit(s) of the alternative treatment(s) or procedure(s); and the risk(s) and benefit(s) of doing nothing. The patient was provided information about the general risks and possible complications associated with the  procedure. These may include, but are not limited to: failure to achieve desired goals, infection, bleeding, organ  or nerve damage, allergic reactions, paralysis, and death. In addition, the patient was informed of those risks and complications associated to the procedure, such as failure to decrease pain; infection; bleeding; organ or nerve damage with subsequent damage to sensory, motor, and/or autonomic systems, resulting in permanent pain, numbness, and/or weakness of one or several areas of the body; allergic reactions; (i.e.: anaphylactic reaction); and/or death. Furthermore, the patient was informed of those risks and complications associated with the medications. These include, but are not limited to: allergic reactions (i.e.: anaphylactic or anaphylactoid reaction(s)); adrenal axis suppression; blood sugar elevation that in diabetics may result in ketoacidosis or comma; water retention that in patients with history of congestive heart failure may result in shortness of breath, pulmonary edema, and decompensation with resultant heart failure; weight gain; swelling or edema; medication-induced neural toxicity; particulate matter embolism and blood vessel occlusion with resultant organ, and/or nervous system infarction; and/or aseptic necrosis of one or more joints. Finally, the patient was informed that Medicine is not an exact science; therefore, there is also the possibility of unforeseen or unpredictable risks and/or possible complications that may result in a catastrophic outcome. The patient indicated having understood very clearly. We have given the patient no guarantees and we have made no promises. Enough time was given to the patient to ask questions, all of which were answered to the patient's satisfaction. Brandi Newton has indicated that she wanted to continue with the procedure. Attestation: I, the ordering provider, attest that I have discussed with the patient the benefits, risks, side-effects,  alternatives, likelihood of achieving goals, and potential problems during recovery for the procedure that I have provided informed consent. Date  Time: 06/19/2021  9:30 AM  Pre-Procedure Preparation:  Monitoring: As per clinic protocol. Respiration, ETCO2, SpO2, BP, heart rate and rhythm monitor placed and checked for adequate function Safety Precautions: Patient was assessed for positional comfort and pressure points before starting the procedure. Time-out: I initiated and conducted the "Time-out" before starting the procedure, as per protocol. The patient was asked to participate by confirming the accuracy of the "Time Out" information. Verification of the correct person, site, and procedure were performed and confirmed by me, the nursing staff, and the patient. "Time-out" conducted as per Joint Commission's Universal Protocol (UP.01.01.01). Time: 1024  Description of Procedure:          Target Area: Superior, posterior, aspect of the sacroiliac fissure Approach: Posterior, paraspinal, ipsilateral approach. Area Prepped: Entire Lower Lumbosacral Region DuraPrep (Iodine Povacrylex [0.7% available iodine] and Isopropyl Alcohol, 74% w/w) Safety Precautions: Aspiration looking for blood return was conducted prior to all injections. At no point did we inject any substances, as a needle was being advanced. No attempts were made at seeking any paresthesias. Safe injection practices and needle disposal techniques used. Medications properly checked for expiration dates. SDV (single dose vial) medications used. Description of the Procedure: Protocol guidelines were followed. The patient was placed in position over the procedure table. The target area was identified and the area prepped in the usual manner. Skin & deeper tissues infiltrated with local anesthetic. Appropriate amount of time allowed to pass for local anesthetics to take effect. The procedure needle was advanced under fluoroscopic guidance into  the sacroiliac joint until a firm endpoint was obtained. Proper needle placement secured. Negative aspiration confirmed. Solution injected in intermittent fashion, asking for systemic symptoms every 0.5cc of injectate. The needles were then removed and the area cleansed, making sure to leave some of the prepping solution back  to take advantage of its long term bactericidal properties. Vitals:   06/19/21 0929 06/19/21 1020 06/19/21 1030  BP: (!) 147/72 (!) 156/72 (!) 161/74  Pulse: 67 69 70  Resp: 16 16 17   Temp: (!) 97.3 F (36.3 C)    TempSrc: Temporal    SpO2:  98% 99%  Weight: 203 lb 3.2 oz (92.2 kg)    Height: 5\' 7"  (1.702 m)      Start Time: 1024 hrs. End Time: 1028 hrs. Materials:  Needle(s) Type: Spinal Needle Gauge: 22G Length: 5.0-in Medication(s): Please see orders for medications and dosing details.  Imaging Guidance (Non-Spinal):          Type of Imaging Technique: Fluoroscopy Guidance (Non-Spinal) Indication(s): Assistance in needle guidance and placement for procedures requiring needle placement in or near specific anatomical locations not easily accessible without such assistance. Exposure Time: Please see nurses notes. Contrast: Before injecting any contrast, we confirmed that the patient did not have an allergy to iodine, shellfish, or radiological contrast. Once satisfactory needle placement was completed at the desired level, radiological contrast was injected. Contrast injected under live fluoroscopy. No contrast complications. See chart for type and volume of contrast used. Fluoroscopic Guidance: I was personally present during the use of fluoroscopy. "Tunnel Vision Technique" used to obtain the best possible view of the target area. Parallax error corrected before commencing the procedure. "Direction-depth-direction" technique used to introduce the needle under continuous pulsed fluoroscopy. Once target was reached, antero-posterior, oblique, and lateral fluoroscopic  projection used confirm needle placement in all planes. Images permanently stored in EMR. Interpretation: I personally interpreted the imaging intraoperatively. Adequate needle placement confirmed in multiple planes. Appropriate spread of contrast into desired area was observed. No evidence of afferent or efferent intravascular uptake. Permanent images saved into the patient's record.  Antibiotic Prophylaxis:   Anti-infectives (From admission, onward)    None      Indication(s): None identified  Post-operative Assessment:  Post-procedure Vital Signs:  Pulse/HCG Rate: 70  Temp: (!) 97.3 F (36.3 C) Resp: 17 BP: (!) 161/74 SpO2: 99 %  EBL: None  Complications: No immediate post-treatment complications observed by team, or reported by patient.  Note: The patient tolerated the entire procedure well. A repeat set of vitals were taken after the procedure and the patient was kept under observation following institutional policy, for this type of procedure. Post-procedural neurological assessment was performed, showing return to baseline, prior to discharge. The patient was provided with post-procedure discharge instructions, including a section on how to identify potential problems. Should any problems arise concerning this procedure, the patient was given instructions to immediately contact us, at any time, without hesitation. In any case, we plan to contact the patient by telephone for a follow-up status report regarding this interventional procedure.  Comments:  No additional relevant information.  Plan of Care  Orders:  Orders Placed This Encounter  Procedures   SACROILIAC JOINT INJECTION    Scheduling Instructions:     Side: Left-sided     Sedation: Patient's choice.     Timeframe: Today    Order Specific Question:   Where will this procedure be performed?    Answer:   [facility]   DG PAIN [facility] C-ARM 1-60 MIN NO REPORT    Intraoperative interpretation by procedural  physician at [place].    Standing Status:   Standing    Number of Occurrences:   1    Order Specific Question:   Reason for exam:  Answer:   Assistance in needle guidance and placement for procedures requiring needle placement in or near specific anatomical locations not easily accessible without such assistance.   Informed Consent Details: Physician/Practitioner Attestation; Transcribe to consent form and obtain patient signature    Nursing Order: Transcribe to consent form and obtain patient signature. Note: Always confirm laterality of pain with Ms. Loree, before procedure.    Order Specific Question:   Physician/Practitioner attestation of informed consent for procedure/surgical case    Answer:   I, the physician/practitioner, attest that I have discussed with the patient the benefits, risks, side effects, alternatives, likelihood of achieving goals and potential problems during recovery for the procedure that I have provided informed consent.    Order Specific Question:   Procedure    Answer:   Sacroiliac Joint Block    Order Specific Question:   Physician/Practitioner performing the procedure    Answer:   Romanita Fager A. 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 (Arthralgia/Arthropathy)   Provide equipment / supplies at bedside    "Block Tray" (Disposable  single use) Needle type: SpinalSpinal Amount/quantity: 1 Size: Regular (3.5-inch) Gauge: 22G    Standing Status:   Standing    Number of Occurrences:   1    Order Specific Question:   Specify    Answer:   Block Tray    Chronic Opioid Analgesic:  None MME/day: 0 mg/day   Medications ordered for procedure: Meds ordered this encounter  Medications   lidocaine (XYLOCAINE) 2 % (with pres) injection 400 mg   ropivacaine (PF) 2 mg/mL (0.2%) (NAROPIN) injection 4 mL   methylPREDNISolone acetate (DEPO-MEDROL) injection 80  mg    Medications administered: We administered lidocaine, ropivacaine (PF) 2 mg/mL (0.2%), and methylPREDNISolone acetate.  See the medical record for exact dosing, route, and time of administration.  Follow-up plan:   Return in about 2 weeks (around 07/03/2021) for procedure day (afternoon F2F) (PPE).      Interventional Therapies  Risk  Complexity Considerations:   Estimated body mass index is 32.11 kg/m as calculated from the following:   Height as of this encounter: 5\' 7"  (1.702 m).   Weight as of this encounter: 205 lb (93 kg). WNL   Planned  Pending:   Diagnostic bilateral lumbar facet block #1    Under consideration:   Diagnostic bilateral lumbar facet block #2  Diagnostic left sacroiliac joint block #1    Completed:   Diagnostic/therapeutic bilateral lumbar (PSIS) trigger point injections x1 (03/20/2021) (20/30/70/70)  Diagnostic bilateral lumbar facet block x1 (04/17/2021) (100/100/100)    Therapeutic  Palliative (PRN) options:   None established       Recent Visits Date Type Provider Dept  05/28/21 Office Visit Milinda Pointer, MD [facility]  04/17/21 Procedure visit Milinda Pointer, MD [facility]  04/08/21 Office Visit Milinda Pointer, MD [facility]  Showing recent visits within past 90 days and meeting all other requirements Today's Visits Date Type Provider Dept  06/19/21 Procedure visit Milinda Pointer, MD [facility]  Showing today's visits and meeting all other requirements Future Appointments Date Type Provider Dept  07/03/21 Appointment Milinda Pointer, MD [facility]  Showing future appointments within next 90 days and meeting all other requirements Disposition: Discharge home  Discharge (Date  Time): 06/19/2021; 1033 hrs.   Primary Care Physician: Ranae Plumber, [place] Location: [facility] Note by: Gaspar Cola, MD Date: 06/19/2021; Time: 7:25  PM  Disclaimer:  Medicine is not an Chief Strategy Officer. The only guarantee in medicine is that nothing is guaranteed. It is important to note that the decision to proceed with this intervention was based on the information collected from the patient. The Data and conclusions were drawn from the patient's questionnaire, the interview, and the physical examination. Because the information was provided in large part by the patient, it cannot be guaranteed that it has not been purposely or unconsciously manipulated. Every effort has been made to obtain as much relevant data as possible for this evaluation. It is important to note that the conclusions that lead to this procedure are derived in large part from the available data. Always take into account that the treatment will also be dependent on availability of resources and existing treatment guidelines, considered by other Pain Management Practitioners as being common knowledge and practice, at the time of the intervention. For Medico-Legal purposes, it is also important to point out that variation in procedural techniques and pharmacological choices are the acceptable norm. The indications, contraindications, technique, and results of the above procedure should only be interpreted and judged by a Board-Certified Interventional Pain Specialist with extensive familiarity and expertise in the same exact procedure and technique.

## 2021-06-19 ENCOUNTER — Other Ambulatory Visit: Payer: Self-pay

## 2021-06-19 ENCOUNTER — Ambulatory Visit (HOSPITAL_BASED_OUTPATIENT_CLINIC_OR_DEPARTMENT_OTHER): Payer: Medicare PPO | Admitting: Pain Medicine

## 2021-06-19 ENCOUNTER — Encounter: Payer: Self-pay | Admitting: Pain Medicine

## 2021-06-19 ENCOUNTER — Ambulatory Visit
Admission: RE | Admit: 2021-06-19 | Discharge: 2021-06-19 | Disposition: A | Discharge status: Home / Self Care | Payer: Medicare PPO | Source: Ambulatory Visit | Attending: Pain Medicine | Admitting: Pain Medicine

## 2021-06-19 VITALS — BP 161/74 | HR 70 | Temp 97.3°F | Resp 17 | Ht 67.0 in | Wt 203.2 lb

## 2021-06-19 DIAGNOSIS — G8929 Other chronic pain: Secondary | ICD-10-CM | POA: Diagnosis not present

## 2021-06-19 DIAGNOSIS — M47898 Other spondylosis, sacral and sacrococcygeal region: Secondary | ICD-10-CM | POA: Insufficient documentation

## 2021-06-19 DIAGNOSIS — M533 Sacrococcygeal disorders, not elsewhere classified: Secondary | ICD-10-CM | POA: Insufficient documentation

## 2021-06-19 DIAGNOSIS — M779 Enthesopathy, unspecified: Secondary | ICD-10-CM | POA: Diagnosis present

## 2021-06-19 MED ORDER — LIDOCAINE HCL (PF) 2 % IJ SOLN
INTRAMUSCULAR | Status: AC
  Filled 2021-06-19: qty 20

## 2021-06-19 MED ORDER — ROPIVACAINE HCL 2 MG/ML IJ SOLN
INTRAMUSCULAR | Status: AC
  Filled 2021-06-19: qty 20

## 2021-06-19 MED ORDER — LIDOCAINE HCL 2 % IJ SOLN
20.0000 mL | Freq: Once | INTRAMUSCULAR | Status: AC
  Administered 2021-06-19: 400 mg

## 2021-06-19 MED ORDER — METHYLPREDNISOLONE ACETATE 80 MG/ML IJ SUSP
80.0000 mg | Freq: Once | INTRAMUSCULAR | Status: AC
  Administered 2021-06-19: 80 mg via INTRA_ARTICULAR
  Filled 2021-06-19: qty 1

## 2021-06-19 MED ORDER — ROPIVACAINE HCL 2 MG/ML IJ SOLN
4.0000 mL | Freq: Once | INTRAMUSCULAR | Status: AC
  Administered 2021-06-19: 4 mL via INTRA_ARTICULAR

## 2021-06-19 NOTE — Patient Instructions (Signed)

## 2021-06-19 NOTE — Progress Notes (Signed)
Safety precautions to be maintained throughout the outpatient stay will include: orient to surroundings, keep bed in low position, maintain call bell within reach at all times, provide assistance with transfer out of bed and ambulation.  

## 2021-06-20 ENCOUNTER — Telehealth: Payer: Self-pay

## 2021-06-20 NOTE — Telephone Encounter (Signed)
Post procedure phone call.  Patient states she is doiog well.

## 2021-06-30 NOTE — Progress Notes (Signed)
PROVIDER NOTE: Information contained herein reflects review and annotations entered in association with encounter. Interpretation of such information and data should be left to medically-trained personnel. Information provided to patient can be located elsewhere in the medical record under "Patient Instructions". Document created using STT-dictation technology, any transcriptional errors that may result from process are unintentional.    Patient: Brandi Newton  Service Category: E/M  Provider: Gaspar Cola, MD  DOB: May 03, 1946  DOS: 07/03/2021  Specialty: Interventional Pain Management  MRN: [id number]  Setting: Ambulatory outpatient  PCP: Ranae Plumber, PA  Type: Established Patient    Referring Provider: Ranae Plumber, PA  Location: Office  Delivery: Face-to-face     HPI  Ms. Brandi Newton, a 75 y.o. year old female, is here today because of her Chronic left sacroiliac joint pain [M53.3, G89.29]. Ms. Gangwer primary complain today is Back Pain (lower) Last encounter: My last encounter with her was on 06/19/2021. Pertinent problems: Ms. 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); Chronic upper back pain (2ry area of Pain) (Bilateral) (R>L); Chronic lower extremity pain (3ry area of Pain) (Bilateral); Lumbar facet syndrome (Bilateral); Chronic knee pain after total replacement (4th area of Pain) (Bilateral) (L>R); Chronic sacroiliac joint pain (Bilateral) (L>R); Abnormal lower extremity EMG (electromyogram)  (01/26/2018); Abnormal upper extremity NCS (nerve conduction studies) (01/24/2018); History of total knee replacement (Bilateral); Lumbar Grade 1 Retrolisthesis of L3/L4; Lumbar facet hypertrophy (Multilevel) (Bilateral); Chronic sacroiliac joint pain (Left); Enthesopathy of sacroiliac joint (Left); Spondylosis without myelopathy or radiculopathy, lumbosacral region; 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 Pain Assessment: Severity of Chronic pain is reported as a 2 /10. Location: Back Lower/radiates down side of left leg  to knee and then on the inside of left knee down to top of foot. Onset: More than a month ago. Quality: Aching, Pins and needles, Discomfort, Sore, Tingling. Timing: Constant. Modifying factor(s): procedures, rest. Vitals:  height is '5\' 7"'  (1.702 m) and weight is 199 lb 6.4 oz (90.4 kg). Her temporal temperature is 96.8 F (36 C) (abnormal). Her blood pressure is 113/57 (abnormal) and her pulse is 73. Her respiration is 16 and oxygen saturation is 97%.   Reason for encounter: post-procedure assessment.  The patient indicates having attained a 100% ongoing relief of the pain in the area of the left SI joint.  However, today she refers that she is having pain in the left knee left lateral aspect of her thigh and medial aspect below the knee.  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  The distribution of her pain is exactly that of the left L4 dermatome.  In addition, she also has a history of having had bilateral total knee replacements  and therefore is unlikely that the knee is the source of her  symptoms especially because she is having pain distal and proximal to the knee.  Today I went over the results of her MRI and I have showed her a dermatomal chart and we have both agreed that this is likely to be the cause of her current complaints.  In view of the results of the MRI of that severe foraminal stenosis, I will be scheduling the patient to return for a left-sided L4-5 TFESI under fluoroscopic guidance and IV sedation.  Post-Procedure Evaluation  Procedure (06/19/2021):  Type: Diagnostic Sacroiliac Joint Steroid Injection          Region: Superior Lumbosacral Region Level: PSIS (Posterior Superior Iliac Spine) Laterality: Left  Pre-procedure pain level: 4/10 Post-procedure: 0/10 (100% relief)  Anxiolysis: none.  Effectiveness during initial hour after procedure (Ultra-Short Term Relief): 100 %.  Local anesthetic used: Long-acting (4-6 hours) Effectiveness: Defined as any analgesic benefit obtained secondary to the administration of local anesthetics. This carries significant diagnostic value as to the etiological location, or anatomical origin, of the pain. Duration of benefit is expected to coincide with the duration of the local anesthetic used.  Effectiveness during initial 4-6 hours after procedure (Short-Term Relief): 100 %.  Long-term benefit: Defined as any relief past the pharmacologic duration of the local anesthetics.  Effectiveness past the initial 6 hours after procedure (Long-Term Relief): 100 %.  Benefits, current: Defined as benefit present at the time of this evaluation.   Analgesia:  90-100% better Function: Ms. Henshaw reports improvement in function ROM: Ms. Shampine reports improvement in ROM  Pharmacotherapy Assessment  Analgesic: None MME/day: 0 mg/day   Monitoring: [place] PMP: PDMP reviewed during this encounter.       Pharmacotherapy: No side-effects or adverse reactions reported. Compliance: No  problems identified. Effectiveness: Clinically acceptable.  Ignatius Specking, RN  07/03/2021  2:50 PM  Sign when Signing Visit Safety precautions to be maintained throughout the outpatient stay will include: orient to surroundings, keep bed in low position, maintain call bell within reach at all times, provide assistance with transfer out of bed and ambulation.      UDS:  Summary  Date Value Ref Range Status  03/19/2021 Note  Final    Comment:    ==================================================================== Compliance Drug Analysis, Ur ==================================================================== Test                             Result       Flag       Units  Drug Present and Declared for Prescription Verification   Gabapentin                     PRESENT      EXPECTED   Citalopram                     PRESENT      EXPECTED   Desmethylcitalopram            PRESENT      EXPECTED    Desmethylcitalopram is an expected metabolite of citalopram or the    enantiomeric form, escitalopram.    Acetaminophen                  PRESENT      EXPECTED  Drug Present not Declared for Prescription Verification   Dextromethorphan               PRESENT      UNEXPECTED  Dextrorphan/Levorphanol        PRESENT      UNEXPECTED    Dextrorphan is an expected metabolite of dextromethorphan, an over-    the-counter or prescription cough suppressant. Dextrorphan cannot be    distinguished from the scheduled prescription medication levorphanol    by the method used for analysis.  ==================================================================== Test                      Result    Flag   Units      Ref Range   Creatinine              91               mg/dL      >=20 ==================================================================== Declared Medications:  The flagging and interpretation on this report are based on the  following declared medications.  Unexpected results may arise from   inaccuracies in the declared medications.   **Note: The testing scope of this panel includes these medications:   Escitalopram (Lexapro)  Gabapentin (Neurontin)   **Note: The testing scope of this panel does not include small to  moderate amounts of these reported medications:   Acetaminophen (Tylenol)   **Note: The testing scope of this panel does not include the  following reported medications:   Albuterol (Ventolin HFA)  Atorvastatin (Lipitor)  Benzonatate (Tessalon)  Cetirizine (Zyrtec)  Cyanocobalamin  Fluticasone (Flonase)  Hydrochlorothiazide (Hydrodiuril)  Losartan (Cozaar)  Polyethylene Glycol (MiraLAX)  Tiotropium (Spiriva)  Vitamin D ==================================================================== For clinical consultation, please call [phone]. ====================================================================      ROS  Constitutional: Denies any fever or chills Gastrointestinal: No reported hemesis, hematochezia, vomiting, or acute GI distress Musculoskeletal: Denies any acute onset joint swelling, redness, loss of ROM, or weakness Neurological: No reported episodes of acute onset apraxia, aphasia, dysarthria, agnosia, amnesia, paralysis, loss of coordination, or loss of consciousness  Medication Review  acetaminophen, albuterol, atorvastatin, cetirizine, chlorthalidone, cholecalciferol, cyanocobalamin, escitalopram, fluticasone, gabapentin, losartan, and polyethylene glycol  History Review  Allergy: Ms. Quraishi is allergic to percocet [oxycodone-acetaminophen] and ace inhibitors. Drug: Ms. Daddona  reports no history of drug use. Alcohol:  reports current alcohol use. Tobacco:  reports that she quit smoking about 42 years ago. Her smoking use included cigarettes. She has a 10.00 pack-year smoking history. She has never used smokeless tobacco. Social: Ms. Bove  reports that she quit smoking about 42 years ago. Her smoking use included cigarettes.  She has a 10.00 pack-year smoking history. She has never used smokeless tobacco. She reports current alcohol use. She reports that she does not use drugs. Medical:  has a past medical history of Anemia, Arthritis, Asthma, Bilateral ovarian cysts, Bronchitis, Constipation, Family history of adverse reaction to anesthesia, GERD (gastroesophageal reflux disease), H/O seasonal allergies, History of kidney stones, Hypertension, PONV (postoperative nausea and vomiting), and Sinus problem. Surgical: Ms. Sida  has a past surgical history that includes nodules vocal cords; back (Bilateral); Laparoscopic hysterectomy (Bilateral, 10/03/2015); Tubal ligation (1984); Cataract extraction; Anterior cervical decomp/discectomy fusion (N/A, 05/11/2018); Breast excisional biopsy (Right, 1975?); Total knee arthroplasty (Right, 04/27/2019); Back surgery (85 or 86); and Total knee arthroplasty (Left, 05/14/2020). Family: family history includes Heart disease in her father and mother; Skin cancer in her father.  Laboratory Chemistry Profile   Renal Lab Results  Component Value Date   BUN 23 03/19/2021   CREATININE 1.40 (H) 03/19/2021   BCR 16 03/19/2021   GFRAA 39 (L) 05/15/2020   GFRNONAA 34 (  L) 05/15/2020    Hepatic Lab Results  Component Value Date   AST 16 03/19/2021   ALT 17 05/03/2020   ALBUMIN 4.2 03/19/2021   ALKPHOS 96 03/19/2021   LIPASE 34 08/28/2015    Electrolytes Lab Results  Component Value Date   NA 141 03/19/2021   K 3.9 03/19/2021   CL 102 03/19/2021   CALCIUM 10.2 03/19/2021   MG 1.9 03/19/2021    Bone Lab Results  Component Value Date   25OHVITD1 55 03/19/2021   25OHVITD2 <1.0 03/19/2021   25OHVITD3 55 03/19/2021    Inflammation (CRP: Acute Phase) (ESR: Chronic Phase) Lab Results  Component Value Date   CRP 18 (H) 03/19/2021   ESRSEDRATE 24 03/19/2021         Note: Above Lab results reviewed.  Recent Imaging Review  DG PAIN CLINIC C-ARM 1-60 MIN NO REPORT Fluoro was used,  but no Radiologist interpretation will be provided.  Please refer to "NOTES" tab for provider progress note. Note: Reviewed        Physical Exam  General appearance: Well nourished, well developed, and well hydrated. In no apparent acute distress Mental status: Alert, oriented x 3 (person, place, & time)       Respiratory: No evidence of acute respiratory distress Eyes: PERLA Vitals: BP (!) 113/57 (BP Location: Right Arm, Patient Position: Sitting, Cuff Size: Normal)   Pulse 73   Temp (!) 96.8 F (36 C) (Temporal)   Resp 16   Ht '5\' 7"'  (1.702 m)   Wt 199 lb 6.4 oz (90.4 kg)   SpO2 97%   BMI 31.23 kg/m  BMI: Estimated body mass index is 31.23 kg/m as calculated from the following:   Height as of this encounter: '5\' 7"'  (1.702 m).   Weight as of this encounter: 199 lb 6.4 oz (90.4 kg). Ideal: Ideal body weight: 61.6 kg (135 lb 12.9 oz) Adjusted ideal body weight: 73.1 kg (161 lb 3.9 oz)  Assessment   Status Diagnosis  Controlled Controlled Controlled 1. Chronic sacroiliac joint pain (Left)   2. Chronic hip pain (Left)   3. Lumbar Grade 1 Retrolisthesis of L3/L4   4. Abnormal MRI, lumbar spine (12/24/2020)   5. Osteoarthritis of knee (Left)   6. History of total knee replacement (Bilateral)   7. 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 Lumbosacral foraminal stenosis (Right: L4-5, L5-S1) (Left: L4-5, severe)   9. Abnormal lower extremity EMG (electromyogram) (01/26/2018)   10. Chronic lumbar radiculopathy (L4) (Left)      Updated Problems: Problem  Chronic lumbar radiculopathy (L4) (Left)    Plan of Care  Problem-specific:  No problem-specific Assessment & Plan notes found for this encounter.  Ms. TARIYAH PENDRY has a current medication list which includes the following long-term medication(s): albuterol, atorvastatin, cetirizine, chlorthalidone, escitalopram, fluticasone, losartan, and gabapentin.  Pharmacotherapy  (Medications Ordered): No orders of the defined types were placed in this encounter.  Orders:  Orders Placed This Encounter  Procedures   Lumbar Transforaminal Epidural    Scheduling Instructions:     Side: Left-sided     Level: L4     Sedation: Patient's choice.     Timeframe: Today    Order Specific Question:   Where will this procedure be performed?    Answer:   [facility]   Informed Consent Details: Physician/Practitioner Attestation; Transcribe to consent form and obtain patient signature    Provider Attestation: I, Hull  Dossie Arbour, MD, (Pain Management Specialist), the physician/practitioner, attest that I have discussed with the patient the benefits, risks, side effects, alternatives, likelihood of achieving goals and potential problems during recovery for the procedure that I have provided informed consent.    Scheduling Instructions:     Note: Always confirm laterality of pain with Ms. Arredondo, before procedure.     Transcribe to consent form and obtain patient signature.    Order Specific Question:   Physician/Practitioner attestation of informed consent for procedure/surgical case    Answer:   I, the physician/practitioner, attest that I have discussed with the patient the benefits, risks, side effects, alternatives, likelihood of achieving goals and potential problems during recovery for the procedure that I have provided informed consent.    Order Specific Question:   Procedure    Answer:   Diagnostic lumbar transforaminal epidural steroid injection under fluoroscopic guidance. (See notes for level and laterality.)    Order Specific Question:   Physician/Practitioner performing the procedure    Answer:   K[place] Fyock A. Dossie Arbour, MD    Order Specific Question:   Indication/Reason    Answer:   Lumbar radiculopathy/radiculitis associated with lumbar stenosis    Follow-up plan:   Return for Procedure (no sedation): (L) L4 TFESI #1.     Interventional Therapies   Risk  Complexity Considerations:   Estimated body mass index is 32.11 kg/m as calculated from the following:   Height as of this encounter: '5\' 7"'  (1.702 m).   Weight as of this encounter: 205 lb (93 kg). WNL   Planned  Pending:   Diagnostic bilateral lumbar facet block #1    Under consideration:   Diagnostic bilateral lumbar facet block #2  Diagnostic left sacroiliac joint block #1    Completed:   Diagnostic/therapeutic bilateral lumbar (PSIS) trigger point injections x1 (03/20/2021) (20/30/70/70)  Diagnostic bilateral lumbar facet block x1 (04/17/2021) (100/100/100)    Therapeutic  Palliative (PRN) options:   None established        Recent Visits Date Type Provider Dept  06/19/21 Procedure visit Milinda Pointer, MD [facility]  05/28/21 Office Visit Milinda Pointer, MD [facility]  04/17/21 Procedure visit Milinda Pointer, MD [facility]  04/08/21 Office Visit Milinda Pointer, MD [facility]  Showing recent visits within past 90 days and meeting all other requirements Today's Visits Date Type Provider Dept  07/03/21 Office Visit Milinda Pointer, MD [facility]  Showing today's visits and meeting all other requirements Future Appointments No visits were found meeting these conditions. Showing future appointments within next 90 days and meeting all other requirements I discussed the assessment and treatment plan with the patient. The patient was provided an opportunity to ask questions and all were answered. The patient agreed with the plan and demonstrated an understanding of the instructions.  Patient advised to call back or seek an in-person evaluation if the symptoms or condition worsens.  Duration of encounter: 30 minutes.  Note by: Gaspar Cola, MD Date: 07/03/2021; Time: 4:38 PM

## 2021-07-03 ENCOUNTER — Other Ambulatory Visit: Payer: Self-pay

## 2021-07-03 ENCOUNTER — Ambulatory Visit: Payer: Medicare PPO | Attending: Pain Medicine | Admitting: Pain Medicine

## 2021-07-03 ENCOUNTER — Encounter: Payer: Self-pay | Admitting: Pain Medicine

## 2021-07-03 VITALS — BP 113/57 | HR 73 | Temp 96.8°F | Resp 16 | Ht 67.0 in | Wt 199.4 lb

## 2021-07-03 DIAGNOSIS — M25562 Pain in left knee: Secondary | ICD-10-CM | POA: Insufficient documentation

## 2021-07-03 DIAGNOSIS — G8928 Other chronic postprocedural pain: Secondary | ICD-10-CM | POA: Diagnosis present

## 2021-07-03 DIAGNOSIS — M4807 Spinal stenosis, lumbosacral region: Secondary | ICD-10-CM | POA: Insufficient documentation

## 2021-07-03 DIAGNOSIS — M4310 Spondylolisthesis, site unspecified: Secondary | ICD-10-CM | POA: Insufficient documentation

## 2021-07-03 DIAGNOSIS — M5416 Radiculopathy, lumbar region: Secondary | ICD-10-CM | POA: Insufficient documentation

## 2021-07-03 DIAGNOSIS — R937 Abnormal findings on diagnostic imaging of other parts of musculoskeletal system: Secondary | ICD-10-CM | POA: Insufficient documentation

## 2021-07-03 DIAGNOSIS — G8929 Other chronic pain: Secondary | ICD-10-CM | POA: Diagnosis present

## 2021-07-03 DIAGNOSIS — M533 Sacrococcygeal disorders, not elsewhere classified: Secondary | ICD-10-CM | POA: Diagnosis not present

## 2021-07-03 DIAGNOSIS — R94131 Abnormal electromyogram [EMG]: Secondary | ICD-10-CM

## 2021-07-03 DIAGNOSIS — M1712 Unilateral primary osteoarthritis, left knee: Secondary | ICD-10-CM | POA: Diagnosis present

## 2021-07-03 DIAGNOSIS — M25561 Pain in right knee: Secondary | ICD-10-CM | POA: Insufficient documentation

## 2021-07-03 DIAGNOSIS — Z96653 Presence of artificial knee joint, bilateral: Secondary | ICD-10-CM | POA: Diagnosis present

## 2021-07-03 DIAGNOSIS — M25552 Pain in left hip: Secondary | ICD-10-CM | POA: Diagnosis present

## 2021-07-03 NOTE — Progress Notes (Signed)
Safety precautions to be maintained throughout the outpatient stay will include: orient to surroundings, keep bed in low position, maintain call bell within reach at all times, provide assistance with transfer out of bed and ambulation.  

## 2021-07-03 NOTE — Patient Instructions (Signed)

## 2021-07-14 NOTE — Progress Notes (Signed)
PROVIDER NOTE: Information contained herein reflects review and annotations entered in association with encounter. Interpretation of such information and data should be left to medically-trained personnel. Information provided to patient can be located elsewhere in the medical record under "Patient Instructions". Document created using STT-dictation technology, any transcriptional errors that may result from process are unintentional.    Patient: Brandi Newton  Service Category: Procedure  Provider: Gaspar Cola, MD  DOB: November 15, 1946  DOS: 07/15/2021  Location: [facility]  MRN: [id number]  Setting: Ambulatory - outpatient  Referring Provider: Ranae Plumber, PA  Type: Established Patient  Specialty: Interventional Pain Management  PCP: Ranae Plumber, [place]   Primary Reason for Visit: Interventional Pain Management Treatment. CC: Back Pain (Lumbar right )  Procedure:          Anesthesia, Analgesia, Anxiolysis:  Type: Trans-Foraminal Epidural Steroid Injection #1  Purpose: Diagnostic/Therapeutic Region: Posterolateral Lumbosacral Target Area: The 6 o'clock position under the pedicle, on the affected side. Approach: Posterior Percutaneous Paravertebral approach. Level: L4 Level Laterality: Left       Type: Local Anesthesia Indication(s): Analgesia         Route: Infiltration ([place]/IM) IV Access: Declined Sedation: Declined  Local Anesthetic: Lidocaine 1-2%  Position: Prone   Indications: 1. Chronic lumbar radiculopathy (L4) (Left)   2. 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 DDD (degenerative disc disease), lumbosacral   4. 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 Lumbar Grade 1 Retrolisthesis of L3/L4   6. 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 (year old), female patient,  seen today for interventional treatment. 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 Brandi Newton has a current medication list which includes the following prescription(s): acetaminophen, albuterol, vitamin c, atorvastatin, cetirizine, chlorthalidone, cholecalciferol, cyanocobalamin, escitalopram, fluticasone, gabapentin, losartan, and polyethylene glycol. Her primarily concern today is the Back Pain (Lumbar right )  Initial Vital Signs:  Pulse/HCG Rate: 65  Temp: (!) 97 F (36.1 C) Resp: 16 BP: (!) 114/46 SpO2: 96 %  BMI: Estimated body mass index is 31.32 kg/m as calculated from the following:   Height as of this encounter: '5\' 7"'$  (1.702 m).   Weight as of this encounter: 200 lb (90.7 kg).  Risk Assessment: Allergies: Reviewed. She is allergic to percocet [oxycodone-acetaminophen] and ace inhibitors.  Allergy Precautions: None required Coagulopathies: Reviewed. None identified.  Blood-thinner therapy: None at this time Active Infection(s): Reviewed. None identified. Brandi Newton is afebrile  Site Confirmation: Brandi Newton was asked to confirm the procedure and laterality before marking the site Procedure checklist: Completed Consent: Before the procedure and under the influence of no sedative(s), amnesic(s), or anxiolytics, the patient was informed of the treatment options, risks and possible complications. To fulfill our ethical and legal obligations, as recommended by the American Medical Association's Code of Ethics, I have informed the patient of my clinical impression; the nature and purpose of the treatment or procedure; the risks, benefits, and  possible complications of the intervention; the alternatives, including doing nothing; the risk(s) and benefit(s) of the alternative treatment(s) or procedure(s); and  the risk(s) and benefit(s) of doing nothing. The patient was provided information about the general risks and possible complications associated with the procedure. These may include, but are not limited to: failure to achieve desired goals, infection, bleeding, organ or nerve damage, allergic reactions, paralysis, and death. In addition, the patient was informed of those risks and complications associated to Spine-related procedures, such as failure to decrease pain; infection (i.e.: Meningitis, epidural or intraspinal abscess); bleeding (i.e.: epidural hematoma, subarachnoid hemorrhage, or any other type of intraspinal or peri-dural bleeding); organ or nerve damage (i.e.: Any type of peripheral nerve, nerve root, or spinal cord injury) with subsequent damage to sensory, motor, and/or autonomic systems, resulting in permanent pain, numbness, and/or weakness of one or several areas of the body; allergic reactions; (i.e.: anaphylactic reaction); and/or death. Furthermore, the patient was informed of those risks and complications associated with the medications. These include, but are not limited to: allergic reactions (i.e.: anaphylactic or anaphylactoid reaction(s)); adrenal axis suppression; blood sugar elevation that in diabetics may result in ketoacidosis or comma; water retention that in patients with history of congestive heart failure may result in shortness of breath, pulmonary edema, and decompensation with resultant heart failure; weight gain; swelling or edema; medication-induced neural toxicity; particulate matter embolism and blood vessel occlusion with resultant organ, and/or nervous system infarction; and/or aseptic necrosis of one or more joints. Finally, the patient was informed that Medicine is not an exact science; therefore, there  is also the possibility of unforeseen or unpredictable risks and/or possible complications that may result in a catastrophic outcome. The patient indicated having understood very clearly. We have given the patient no guarantees and we have made no promises. Enough time was given to the patient to ask questions, all of which were answered to the patient's satisfaction. Ms. Mousel has indicated that she wanted to continue with the procedure. Attestation: I, the ordering provider, attest that I have discussed with the patient the benefits, risks, side-effects, alternatives, likelihood of achieving goals, and potential problems during recovery for the procedure that I have provided informed consent. Date  Time: 07/15/2021 10:20 AM  Pre-Procedure Preparation:  Monitoring: As per clinic protocol. Respiration, ETCO2, SpO2, BP, heart rate and rhythm monitor placed and checked for adequate function Safety Precautions: Patient was assessed for positional comfort and pressure points before starting the procedure. Time-out: I initiated and conducted the "Time-out" before starting the procedure, as per protocol. The patient was asked to participate by confirming the accuracy of the "Time Out" information. Verification of the correct person, site, and procedure were performed and confirmed by me, the nursing staff, and the patient. "Time-out" conducted as per Joint Commission's Universal Protocol (UP.01.01.01). Time: 1056  Description of Procedure:          Area Prepped: Entire Posterior Lumbosacral Area DuraPrep (Iodine Povacrylex [0.7% available iodine] and Isopropyl Alcohol, 74% w/w) Safety Precautions: Aspiration looking for blood return was conducted prior to all injections. At no point did we inject any substances, as a needle was being advanced. No attempts were made at seeking any paresthesias. Safe injection practices and needle disposal techniques used. Medications properly checked for expiration dates. SDV  (single dose vial) medications used. Description of the Procedure: Protocol guidelines were followed. The patient was placed in position over the procedure table. The target area was identified and the area prepped in the usual manner. Skin & deeper tissues infiltrated with local anesthetic. Appropriate amount of time allowed to pass for local anesthetics to take effect. The  procedure needles were then advanced to the target area. Proper needle placement secured. Negative aspiration confirmed. Solution injected in intermittent fashion, asking for systemic symptoms every 0.5cc of injectate. The needles were then removed and the area cleansed, making sure to leave some of the prepping solution back to take advantage of its long term bactericidal properties.  Vitals:   07/15/21 1019 07/15/21 1048 07/15/21 1058  BP: (!) 114/46 134/63 109/67  Pulse: 65 73 65  Resp: 16 15 (!) 22  Temp: (!) 97 F (36.1 C)    TempSrc: Temporal    SpO2: 96% 97% 98%  Weight: 200 lb (90.7 kg)    Height: '5\' 7"'$  (1.702 m)      Start Time: 1056 hrs. End Time: 1104 hrs.  Materials:  Needle(s) Type: Spinal Needle Gauge: 22G Length: 3.5-in Medication(s): Please see orders for medications and dosing details.  Imaging Guidance (Spinal):          Type of Imaging Technique: Fluoroscopy Guidance (Spinal) Indication(s): Assistance in needle guidance and placement for procedures requiring needle placement in or near specific anatomical locations not easily accessible without such assistance. Exposure Time: Please see nurses notes. Contrast: Before injecting any contrast, we confirmed that the patient did not have an allergy to iodine, shellfish, or radiological contrast. Once satisfactory needle placement was completed at the desired level, radiological contrast was injected. Contrast injected under live fluoroscopy. No contrast complications. See chart for type and volume of contrast used. Fluoroscopic Guidance: I was  personally present during the use of fluoroscopy. "Tunnel Vision Technique" used to obtain the best possible view of the target area. Parallax error corrected before commencing the procedure. "Direction-depth-direction" technique used to introduce the needle under continuous pulsed fluoroscopy. Once target was reached, antero-posterior, oblique, and lateral fluoroscopic projection used confirm needle placement in all planes. Images permanently stored in EMR. Interpretation: I personally interpreted the imaging intraoperatively. Adequate needle placement confirmed in multiple planes. Appropriate spread of contrast into desired area was observed. No evidence of afferent or efferent intravascular uptake. No intrathecal or subarachnoid spread observed. Permanent images saved into the patient's record.  Antibiotic Prophylaxis:   Anti-infectives (From admission, onward)    None      Indication(s): None identified  Post-operative Assessment:  Post-procedure Vital Signs:  Pulse/HCG Rate: 65 (nsr)  Temp: (!) 97 F (36.1 C) Resp: (!) 22 BP: 109/67 SpO2: 98 %  EBL: None  Complications: No immediate post-treatment complications observed by team, or reported by patient.  Note: The patient tolerated the entire procedure well. A repeat set of vitals were taken after the procedure and the patient was kept under observation following institutional policy, for this type of procedure. Post-procedural neurological assessment was performed, showing return to baseline, prior to discharge. The patient was provided with post-procedure discharge instructions, including a section on how to identify potential problems. Should any problems arise concerning this procedure, the patient was given instructions to immediately contact us, at any time, without hesitation. In any case, we plan to contact the patient by telephone for a follow-up status report regarding this interventional procedure.  Comments:  No additional  relevant information.  Plan of Care  Orders:  Orders Placed This Encounter  Procedures   Lumbar Transforaminal Epidural    Scheduling Instructions:     Side: Left-sided     Level: L4     Sedation: No Sedation.     Timeframe: Today    Order Specific Question:   Where will this procedure be performed?  Answer:   [facility]   DG [facility] C-ARM 1-60 MIN NO REPORT    Intraoperative interpretation by procedural physician at [place].    Standing Status:   Standing    Number of Occurrences:   1    Order Specific Question:   Reason for exam:    Answer:   Assistance in needle guidance and placement for procedures requiring needle placement in or near specific anatomical locations not easily accessible without such assistance.   Informed Consent Details: Physician/Practitioner Attestation; Transcribe to consent form and obtain patient signature    Provider Attestation: I, [place] Dossie Arbour, MD, (Pain Management Specialist), the physician/practitioner, attest that I have discussed with the patient the benefits, risks, side effects, alternatives, likelihood of achieving goals and potential problems during recovery for the procedure that I have provided informed consent.    Scheduling Instructions:     Note: Always confirm laterality of pain with Ms. Behne, before procedure.     Transcribe to consent form and obtain patient signature.    Order Specific Question:   Physician/Practitioner attestation of informed consent for procedure/surgical case    Answer:   I, the physician/practitioner, attest that I have discussed with the patient the benefits, risks, side effects, alternatives, likelihood of achieving goals and potential problems during recovery for the procedure that I have provided informed consent.    Order Specific Question:   Procedure    Answer:   Diagnostic lumbar transforaminal epidural steroid injection under fluoroscopic guidance. (See notes for level and  laterality.)    Order Specific Question:   Physician/Practitioner performing the procedure    Answer:   Advika Mclelland A. Dossie Arbour, MD    Order Specific Question:   Indication/Reason    Answer:   Lumbar radiculopathy/radiculitis associated with lumbar stenosis   Provide equipment / supplies at bedside    "Block Tray" (Disposable  single use) Needle type: SpinalSpinal Amount/quantity: 1 Size: Medium (5-inch) Gauge: 22G    Standing Status:   Standing    Number of Occurrences:   1    Order Specific Question:   Specify    Answer:   Block Tray    Chronic Opioid Analgesic:  None MME/day: 0 mg/day   Medications ordered for procedure: Meds ordered this encounter  Medications   iohexol (OMNIPAQUE) 180 MG/ML injection 10 mL    Must be Myelogram-compatible. If not available, you may substitute with a water-soluble, non-ionic, hypoallergenic, myelogram-compatible radiological contrast medium.   lidocaine (XYLOCAINE) 2 % (with pres) injection 400 mg   sodium chloride flush (NS) 0.9 % injection 1 mL   ropivacaine (PF) 2 mg/mL (0.2%) (NAROPIN) injection 1 mL   dexamethasone (DECADRON) injection 10 mg    Medications administered: We administered iohexol, lidocaine, sodium chloride flush, ropivacaine (PF) 2 mg/mL (0.2%), and dexamethasone.  See the medical record for exact dosing, route, and time of administration.  Follow-up plan:   Return in 2 weeks (on 07/29/2021) for (F2F-PPE) PM Proc-day (T,Th).      Interventional Therapies  Risk  Complexity Considerations:   Estimated body mass index is 32.11 kg/m as calculated from the following:   Height as of this encounter: '5\' 7"'$  (1.702 m).   Weight as of this encounter: 205 lb (93 kg). WNL   Planned  Pending:   Diagnostic bilateral lumbar facet block #1    Under consideration:   Diagnostic bilateral lumbar facet block #2  Diagnostic left sacroiliac joint block #1    Completed:  Diagnostic/therapeutic bilateral lumbar (PSIS) trigger point  injections x1 (03/20/2021) (20/30/70/70)  Diagnostic bilateral lumbar facet block x1 (04/17/2021) (100/100/100)    Therapeutic  Palliative (PRN) options:   None established         Recent Visits Date Type Provider Dept  07/03/21 Office Visit Milinda Pointer, MD [facility]  06/19/21 Procedure visit Milinda Pointer, MD [facility]  05/28/21 Office Visit Milinda Pointer, MD [facility]  04/17/21 Procedure visit Milinda Pointer, MD [facility]  Showing recent visits within past 90 days and meeting all other requirements Today's Visits Date Type Provider Dept  07/15/21 Procedure visit Milinda Pointer, MD [facility]  Showing today's visits and meeting all other requirements Future Appointments Date Type Provider Dept  08/26/21 Appointment Milinda Pointer, MD [facility]  Showing future appointments within next 90 days and meeting all other requirements Disposition: Discharge home  Discharge (Date  Time): 07/15/2021;   hrs.   Primary Care Physician: Ranae Plumber, PA Location: [facility] Note by: Gaspar Cola, MD Date: 07/15/2021; Time: 1:27 PM  Disclaimer:  Medicine is not an Chief Strategy Officer. The only guarantee in medicine is that nothing is guaranteed. It is important to note that the decision to proceed with this intervention was based on the information collected from the patient. The Data and conclusions were drawn from the patient's questionnaire, the interview, and the physical examination. Because the information was provided in large part by the patient, it cannot be guaranteed that it has not been purposely or unconsciously manipulated. Every effort has been made to obtain as much relevant data as possible for this evaluation. It is important to note that the conclusions that lead to this procedure are derived in large part from the available data. Always take into account  that the treatment will also be dependent on availability of resources and existing treatment guidelines, considered by other Pain Management Practitioners as being common knowledge and practice, at the time of the intervention. For Medico-Legal purposes, it is also important to point out that variation in procedural techniques and pharmacological choices are the acceptable norm. The indications, contraindications, technique, and results of the above procedure should only be interpreted and judged by a Board-Certified Interventional Pain Specialist with extensive familiarity and expertise in the same exact procedure and technique.

## 2021-07-15 ENCOUNTER — Ambulatory Visit (HOSPITAL_BASED_OUTPATIENT_CLINIC_OR_DEPARTMENT_OTHER): Payer: Medicare PPO | Admitting: Pain Medicine

## 2021-07-15 ENCOUNTER — Other Ambulatory Visit: Payer: Self-pay

## 2021-07-15 ENCOUNTER — Ambulatory Visit
Admission: RE | Admit: 2021-07-15 | Discharge: 2021-07-15 | Disposition: A | Discharge status: Home / Self Care | Payer: Medicare PPO | Source: Ambulatory Visit | Attending: Pain Medicine | Admitting: Pain Medicine

## 2021-07-15 ENCOUNTER — Encounter: Payer: Self-pay | Admitting: Pain Medicine

## 2021-07-15 VITALS — BP 109/67 | HR 65 | Temp 97.0°F | Resp 22 | Ht 67.0 in | Wt 200.0 lb

## 2021-07-15 DIAGNOSIS — M5416 Radiculopathy, lumbar region: Secondary | ICD-10-CM

## 2021-07-15 DIAGNOSIS — M4310 Spondylolisthesis, site unspecified: Secondary | ICD-10-CM | POA: Insufficient documentation

## 2021-07-15 DIAGNOSIS — M4807 Spinal stenosis, lumbosacral region: Secondary | ICD-10-CM

## 2021-07-15 DIAGNOSIS — M5137 Other intervertebral disc degeneration, lumbosacral region: Secondary | ICD-10-CM | POA: Diagnosis present

## 2021-07-15 DIAGNOSIS — M48061 Spinal stenosis, lumbar region without neurogenic claudication: Secondary | ICD-10-CM | POA: Insufficient documentation

## 2021-07-15 MED ORDER — ROPIVACAINE HCL 2 MG/ML IJ SOLN
INTRAMUSCULAR | Status: AC
  Filled 2021-07-15: qty 20

## 2021-07-15 MED ORDER — DEXAMETHASONE SODIUM PHOSPHATE 10 MG/ML IJ SOLN
10.0000 mg | Freq: Once | INTRAMUSCULAR | Status: AC
  Administered 2021-07-15: 10 mg
  Filled 2021-07-15: qty 1

## 2021-07-15 MED ORDER — SODIUM CHLORIDE (PF) 0.9 % IJ SOLN
INTRAMUSCULAR | Status: AC
  Filled 2021-07-15: qty 10

## 2021-07-15 MED ORDER — IOHEXOL 180 MG/ML  SOLN
10.0000 mL | Freq: Once | INTRAMUSCULAR | Status: AC
  Administered 2021-07-15: 10 mL via INTRATHECAL
  Filled 2021-07-15: qty 20

## 2021-07-15 MED ORDER — ROPIVACAINE HCL 2 MG/ML IJ SOLN
1.0000 mL | Freq: Once | INTRAMUSCULAR | Status: AC
  Administered 2021-07-15: 1 mL via EPIDURAL

## 2021-07-15 MED ORDER — SODIUM CHLORIDE 0.9% FLUSH
1.0000 mL | Freq: Once | INTRAVENOUS | Status: AC
  Administered 2021-07-15: 1 mL

## 2021-07-15 MED ORDER — LIDOCAINE HCL (PF) 2 % IJ SOLN
INTRAMUSCULAR | Status: AC
  Filled 2021-07-15: qty 5

## 2021-07-15 MED ORDER — LIDOCAINE HCL 2 % IJ SOLN
20.0000 mL | Freq: Once | INTRAMUSCULAR | Status: AC
  Administered 2021-07-15: 400 mg

## 2021-07-15 NOTE — Progress Notes (Signed)
Safety precautions to be maintained throughout the outpatient stay will include: orient to surroundings, keep bed in low position, maintain call bell within reach at all times, provide assistance with transfer out of bed and ambulation.  

## 2021-07-16 ENCOUNTER — Telehealth: Payer: Self-pay

## 2021-07-16 NOTE — Telephone Encounter (Signed)
Post procedure phone call.  Patient states she is doing well.  

## 2021-07-28 NOTE — Progress Notes (Signed)
PROVIDER NOTE: Information contained herein reflects review and annotations entered in association with encounter. Interpretation of such information and data should be left to medically-trained personnel. Information provided to patient can be located elsewhere in the medical record under "Patient Instructions". Document created using STT-dictation technology, any transcriptional errors that may result from process are unintentional.    Patient: Brandi Newton  Service Category: E/M  Provider: Gaspar Cola, MD  DOB: 10/05/1946  DOS: 07/29/2021  Specialty: Interventional Pain Management  MRN: [id number]  Setting: Ambulatory outpatient  PCP: Ranae Plumber, PA  Type: Established Patient    Referring Provider: Ranae Plumber, PA  Location: Office  Delivery: Face-to-face     HPI  Ms. Brandi Newton, a 75 y.o. year old female, is here today because of her Trigger point with back pain [M54.9]. Brandi Newton primary complain today is Back Pain Last encounter: My last encounter with her was on 07/15/2021. 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); Chronic upper back pain (2ry area of Pain) (Bilateral) (R>L); Chronic lower extremity pain (3ry area of Pain) (Bilateral); Lumbar facet syndrome (Bilateral); Chronic knee pain after total replacement (4th area of Pain) (Bilateral) (L>R); Chronic sacroiliac joint pain (Bilateral) (L>R); Abnormal lower extremity EMG (electromyogram) (01/26/2018); Abnormal upper  extremity NCS (nerve conduction studies) (01/24/2018); History of total knee replacement (Bilateral); Lumbar Grade 1 Retrolisthesis of L3/L4; Lumbar facet hypertrophy (Multilevel) (Bilateral); Chronic sacroiliac joint pain (Left); Enthesopathy of sacroiliac joint (Left); Spondylosis without myelopathy or radiculopathy, lumbosacral region; 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 Pain Assessment: Severity of Chronic pain is reported as a 2 /10. Location: Back  /pain radiaties down left leg. Onset: More than a month ago. Quality: Aching. Timing: Constant. Modifying factor(s): procedures, rest and laying down. Vitals:  height is _0  (1.702 m) and weight is 200 lb (90.7 kg). Her temperature is 96.6 F (35.9 C) (abnormal). Her blood pressure is 129/67 and her pulse is 73. Her oxygen saturation is 98%.   Reason for encounter: post-procedure assessment.  According to the patient the left-sided transforaminal epidural steroid injection did provide her with 100% relief of the pain for the duration of the local anesthetic however, once that local anesthetic wore off the pain started coming back.  She indicates currently enjoying only 20% ongoing benefit.  Today we had a very long conversation regarding this procedure and she indicates that the pain going down the leg seems to have improved, but she is again having quite a bit of pain in the lower back.  I spent quite a bit of time with her today explaining the difference between intraspinal procedures and perispinal procedure such as the facet blocks and how  structures in the posterior aspect of the spine are responsible for her low back pain and she is complaining today.  Today I have recommended to proceed with a repeat lumbar facet block which we did for her early on her treatment and it did provide her with good benefit of the low back pain.  The patient has indicated that she will like to have some trigger point injections today and she seems to have 2 very distinct that trigger points around the PSIS, bilaterally.  Post-Procedure Evaluation  Procedure (07/15/2021):  Type: Trans-Foraminal Epidural Steroid Injection #1  Purpose: Diagnostic/Therapeutic Region: Posterolateral Lumbosacral Target Area: The 6 o'clock position under the pedicle, on the affected side. Approach: Posterior Percutaneous Paravertebral approach. Level: L4 Level Laterality: Left    Pre-procedure pain level: 2/10 Post-procedure: 0/10 (100% relief)  Anxiolysis: none.  Effectiveness during initial hour after procedure (Ultra-Short Term Relief): 100 %.  Local anesthetic used: Long-acting (4-6 hours) Effectiveness: Defined as any analgesic benefit obtained secondary to the administration of local anesthetics. This carries significant diagnostic value as to the etiological location, or anatomical origin, of the pain. Duration of benefit is expected to coincide with the duration of the local anesthetic used.  Effectiveness during initial 4-6 hours after procedure (Short-Term Relief): 100 %.  Long-term benefit: Defined as any relief past the pharmacologic duration of the local anesthetics.  Effectiveness past the initial 6 hours after procedure (Long-Term Relief): 20 % (ongoing).  Benefits, current: Defined as benefit present at the time of this evaluation.   Analgesia:   The patient indicates currently enjoying an ongoing 20% relief of her low back pain with 90% relief of her left lower extremity pain. Function: Brandi Newton reports improvement in function ROM: Brandi Newton reports  improvement in ROM  Pharmacotherapy Assessment  Analgesic: None MME/day: 0 mg/day   Monitoring: [place] PMP: PDMP reviewed during this encounter.       Pharmacotherapy: No side-effects or adverse reactions reported. Compliance: No problems identified. Effectiveness: Clinically acceptable.  No notes on file  UDS:  Summary  Date Value Ref Range Status  03/19/2021 Note  Final    Comment:    ==================================================================== Compliance Drug Analysis, Ur ==================================================================== Test                             Result       Flag       Units  Drug Present and Declared for Prescription Verification   Gabapentin                     PRESENT      EXPECTED   Citalopram                     PRESENT      EXPECTED   Desmethylcitalopram            PRESENT      EXPECTED    Desmethylcitalopram is an expected metabolite of citalopram or the    enantiomeric form, escitalopram.    Acetaminophen                  PRESENT      EXPECTED  Drug Present not Declared for Prescription Verification   Dextromethorphan               PRESENT      UNEXPECTED   Dextrorphan/Levorphanol        PRESENT      UNEXPECTED    Dextrorphan is an expected metabolite of dextromethorphan, an over-    the-counter or prescription  cough suppressant. Dextrorphan cannot be    distinguished from the scheduled prescription medication levorphanol    by the method used for analysis.  ==================================================================== Test                      Result    Flag   Units      Ref Range   Creatinine              91               mg/dL      >=20 ==================================================================== Declared Medications:  The flagging and interpretation on this report are based on the  following declared medications.  Unexpected results may arise from  inaccuracies in the declared medications.   **Note: The  testing scope of this panel includes these medications:   Escitalopram (Lexapro)  Gabapentin (Neurontin)   **Note: The testing scope of this panel does not include small to  moderate amounts of these reported medications:   Acetaminophen (Tylenol)   **Note: The testing scope of this panel does not include the  following reported medications:   Albuterol (Ventolin HFA)  Atorvastatin (Lipitor)  Benzonatate (Tessalon)  Cetirizine (Zyrtec)  Cyanocobalamin  Fluticasone (Flonase)  Hydrochlorothiazide (Hydrodiuril)  Losartan (Cozaar)  Polyethylene Glycol (MiraLAX)  Tiotropium (Spiriva)  Vitamin D ==================================================================== For clinical consultation, please call [phone]. ====================================================================      ROS  Constitutional: Denies any fever or chills Gastrointestinal: No reported hemesis, hematochezia, vomiting, or acute GI distress Musculoskeletal: Denies any acute onset joint swelling, redness, loss of ROM, or weakness Neurological: No reported episodes of acute onset apraxia, aphasia, dysarthria, agnosia, amnesia, paralysis, loss of coordination, or loss of consciousness  Medication Review  acetaminophen, albuterol, atorvastatin, cetirizine, chlorthalidone, cholecalciferol, cyanocobalamin, escitalopram, fluticasone, gabapentin, losartan, polyethylene glycol, and vitamin C  History Review  Allergy: Brandi Newton is allergic to percocet [oxycodone-acetaminophen] and ace inhibitors. Drug: Brandi Newton  reports no history of drug use. Alcohol:  reports current alcohol use. Tobacco:  reports that she quit smoking about 42 years ago. Her smoking use included cigarettes. She has a 10.00 pack-year smoking history. She has never used smokeless tobacco. Social: Brandi Newton  reports that she quit smoking about 42 years ago. Her smoking use included cigarettes. She has a 10.00 pack-year smoking history. She  has never used smokeless tobacco. She reports current alcohol use. She reports that she does not use drugs. Medical:  has a past medical history of Anemia, Arthritis, Asthma, Bilateral ovarian cysts, Bronchitis, Constipation, Family history of adverse reaction to anesthesia, GERD (gastroesophageal reflux disease), H/O seasonal allergies, History of kidney stones, Hypertension, PONV (postoperative nausea and vomiting), and Sinus problem. Surgical: Ms. Prowell  has a past surgical history that includes nodules vocal cords; back (Bilateral); Laparoscopic hysterectomy (Bilateral, 10/03/2015); Tubal ligation (1984); Cataract extraction; Anterior cervical decomp/discectomy fusion (N/A, 05/11/2018); Breast excisional biopsy (Right, 1975?); Total knee arthroplasty (Right, 04/27/2019); Back surgery (87 or 88); and Total knee arthroplasty (Left, 05/14/2020). Family: family history includes Heart disease in her father and mother; Skin cancer in her father.  Laboratory Chemistry Profile   Renal Lab Results  Component Value Date   BUN 23 03/19/2021   CREATININE 1.40 (H) 03/19/2021   BCR 16 03/19/2021   GFRAA 39 (L) 05/15/2020   GFRNONAA 34 (L) 05/15/2020    Hepatic Lab Results  Component Value Date   AST 16 03/19/2021   ALT 17 05/03/2020   ALBUMIN 4.2 03/19/2021   ALKPHOS 96  03/19/2021   LIPASE 34 08/28/2015    Electrolytes Lab Results  Component Value Date   NA 141 03/19/2021   K 3.9 03/19/2021   CL 102 03/19/2021   CALCIUM 10.2 03/19/2021   MG 1.9 03/19/2021    Bone Lab Results  Component Value Date   25OHVITD1 55 03/19/2021   25OHVITD2 <1.0 03/19/2021   25OHVITD3 55 03/19/2021    Inflammation (CRP: Acute Phase) (ESR: Chronic Phase) Lab Results  Component Value Date   CRP 18 (H) 03/19/2021   ESRSEDRATE 24 03/19/2021         Note: Above Lab results reviewed.  Recent Imaging Review  DG PAIN CLINIC C-ARM 1-60 MIN NO REPORT Fluoro was used, but no Radiologist interpretation will be  provided.  Please refer to "NOTES" tab for provider progress note. Note: Reviewed        Physical Exam  General appearance: Well nourished, well developed, and well hydrated. In no apparent acute distress Mental status: Alert, oriented x 3 (person, place, & time)       Respiratory: No evidence of acute respiratory distress Eyes: PERLA Vitals: BP 129/67   Pulse 73   Temp (!) 96.6 F (35.9 C)   Ht _0  (1.702 m)   Wt 200 lb (90.7 kg)   SpO2 98%   BMI 31.32 kg/m  BMI: Estimated body mass index is 31.32 kg/m as calculated from the following:   Height as of this encounter: _1  (1.702 m).   Weight as of this encounter: 200 lb (90.7 kg). Ideal: Ideal body weight: 61.6 kg (135 lb 12.9 oz) Adjusted ideal body weight: 73.2 kg (161 lb 7.7 oz)  Assessment   Status Diagnosis  Controlled Controlled Controlled 1. Trigger point with back pain   2. 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 Chronic low back pain (1ry area of Pain) (Bilateral) (R>L) w/o sciatica   4. Lumbar facet syndrome (Bilateral)   5. Lumbar facet hypertrophy (Multilevel) (Bilateral)   6. Chronic upper back pain (2ry area of Pain) (Bilateral) (R>L)   7. 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  Brandi Newton has a current medication list which includes the following long-term medication(s): albuterol, atorvastatin, cetirizine, chlorthalidone, escitalopram, fluticasone, losartan, and gabapentin.  Pharmacotherapy (Medications Ordered): Meds ordered this encounter  Medications   triamcinolone acetonide (KENALOG-40) injection 40 mg   ropivacaine (PF) 2 mg/mL (0.2%) (NAROPIN) injection 9 mL    Orders:  Orders Placed This Encounter  Procedures   LUMBAR FACET(MEDIAL BRANCH NERVE  BLOCK) MBNB    Standing Status:   Future    Standing Expiration Date:   08/29/2021    Scheduling Instructions:     Procedure: Lumbar facet block (AKA.: Lumbosacral medial branch nerve block)     Side: Bilateral     Level: L3-4, L4-5, & L5-S1 Facets (L2, L3, L4, L5, & S1 Medial Branch Nerves)     Sedation: Patient's choice.     Timeframe: ASAA    Order Specific Question:   Where will this procedure be performed?    Answer:   [facility]   TRIGGER POINT INJECTION    Scheduling Instructions:     Area: Lower Back     Side: Bilateral  Sedation: No Sedation.     Timeframe: Today    Order Specific Question:   Where will this procedure be performed?    Answer:   [facility]   Informed Consent Details: Physician/Practitioner Attestation; Transcribe to consent form and obtain patient signature    Nursing Order: Transcribe to consent form and obtain patient signature. Note: Always confirm laterality of pain with Brandi Newton, before procedure.    Order Specific Question:   Physician/Practitioner attestation of informed consent for procedure/surgical case    Answer:   I, the physician/practitioner, attest that I have discussed with the patient the benefits, risks, side effects, alternatives, likelihood of achieving goals and potential problems during recovery for the procedure that I have provided informed consent.    Order Specific Question:   Procedure    Answer:   Lumbar Facet Block  under fluoroscopic guidance    Order Specific Question:   Physician/Practitioner performing the procedure    Answer:   Keondrick Dilks A. Dossie Arbour MD    Order Specific Question:   Indication/Reason    Answer:   Low Back Pain, with our without leg pain, due to Facet Joint Arthralgia (Joint Pain) Spondylosis (Arthritis of the Spine), without myelopathy or radiculopathy (Nerve Damage).   Informed Consent Details: Physician/Practitioner Attestation; Transcribe to consent form and obtain patient signature     Provider Attestation: I, [place] Dossie Arbour, MD, (Pain Management Specialist), the physician/practitioner, attest that I have discussed with the patient the benefits, risks, side effects, alternatives, likelihood of achieving goals and potential problems during recovery for the procedure that I have provided informed consent.    Scheduling Instructions:     Note: Always confirm laterality of pain with Brandi Newton, before procedure.     Transcribe to consent form and obtain patient signature.    Order Specific Question:   Physician/Practitioner attestation of informed consent for procedure/surgical case    Answer:   I, the physician/practitioner, attest that I have discussed with the patient the benefits, risks, side effects, alternatives, likelihood of achieving goals and potential problems during recovery for the procedure that I have provided informed consent.    Order Specific Question:   Procedure    Answer:   Myoneural Block (Trigger Point injection)    Order Specific Question:   Physician/Practitioner performing the procedure    Answer:   Lynett Brasil A. Dossie Arbour MD    Order Specific Question:   Indication/Reason    Answer:   Musculoskeletal pain/myofascial pain secondary to trigger point    Follow-up plan:   Return for [facility]) procedure: (B) L-FCT BLK.     Interventional Therapies  Risk  Complexity Considerations:   Estimated body mass index is 32.11 kg/m as calculated from the following:   Height as of this encounter: _0  (1.702 m).   Weight as of this encounter: 205 lb (93 kg). WNL   Planned  Pending:   Therapeutic bilateral PSIS trigger point injection x2 (07/29/2021)  Diagnostic bilateral lumbar facet block #2    Under consideration:   Diagnostic bilateral lumbar facet block #2  Diagnostic left sacroiliac joint block #1    Completed:   Diagnostic/therapeutic bilateral lumbar (PSIS) trigger point injections x1 (03/20/2021) (20/30/70/70)  Diagnostic bilateral lumbar facet  block x1 (04/17/2021) (100/100/100)    Therapeutic  Palliative (PRN) options:   None established      PROVIDER NOTE: Information contained herein reflects review and annotations entered in association with encounter. Interpretation of such information and data should be left to  medically-trained personnel. Information provided to patient can be located elsewhere in the medical record under "Patient Instructions". Document created using STT-dictation technology, any transcriptional errors that may result from process are unintentional.    Patient: SYDNEY HASTEN  Service Category: Procedure  Provider: Gaspar Cola, MD  DOB: 1946/04/22  DOS: 07/29/2021  Location: [facility]  MRN: [id number]  Setting: Ambulatory - outpatient  Referring Provider: Ranae Plumber, PA  Type: Established Patient  Specialty: Interventional Pain Management  PCP: Ranae Plumber, [place]   Primary Reason for Visit: Interventional Pain Management Treatment. CC: Back Pain  Procedure:          Anesthesia, Analgesia, Anxiolysis:  Type: Erector spinae muscle and quadratus lumborum muscle Trigger Point Injection (1-2 muscle groups)          CPT: 20552 Primary Purpose: Therapeutic Region:             Level: Lumbosacral Target Area: PSIS Trigger Point Approach: Percutaneous, ipsilateral approach. Laterality: Bilateral        Type: Local Anesthesia Indication(s): Analgesia         Local Anesthetic: Lidocaine 1-2% Route: Infiltration ([place]/IM) IV Access: Declined Sedation: Declined   Position: Prone   Indications: 1. Trigger point with back pain   2. 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 Chronic low back pain (1ry area of Pain) (Bilateral) (R>L) w/o sciatica    Pain Score: Pre-procedure: 2 /10 Post-procedure: 2 /10   Pre-op H&P Assessment:  Brandi Newton is a 75 y.o. (year old), female patient, seen today for interventional treatment. She  has a past surgical history that  includes nodules vocal cords; back (Bilateral); Laparoscopic hysterectomy (Bilateral, 10/03/2015); Tubal ligation (1984); Cataract extraction; Anterior cervical decomp/discectomy fusion (N/A, 05/11/2018); Breast excisional biopsy (Right, 1975?); Total knee arthroplasty (Right, 04/27/2019); Back surgery (85 or 86); and Total knee arthroplasty (Left, 05/14/2020). Brandi Newton has a current medication list which includes the following prescription(s): acetaminophen, albuterol, vitamin c, atorvastatin, cetirizine, chlorthalidone, cholecalciferol, cyanocobalamin, escitalopram, fluticasone, losartan, polyethylene glycol, and gabapentin. Her primarily concern today is the Back Pain  Initial Vital Signs:  Pulse/HCG Rate: 73  Temp: (!) 96.6 F (35.9 C) Resp:   BP: 129/67 SpO2: 98 %  BMI: Estimated body mass index is 31.32 kg/m as calculated from the following:   Height as of this encounter: _0  (1.702 m).   Weight as of this encounter: 200 lb (90.7 kg).  Risk Assessment: Allergies: Reviewed. She is allergic to percocet [oxycodone-acetaminophen] and ace inhibitors.  Allergy Precautions: None required Coagulopathies: Reviewed. None identified.  Blood-thinner therapy: None at this time Active Infection(s): Reviewed. None identified. Brandi Newton is afebrile  Site Confirmation: Brandi Newton was asked to confirm the procedure and laterality before marking the site Procedure checklist: Completed Consent: Before the procedure and under the influence of no sedative(s), amnesic(s), or anxiolytics, the patient was informed of the treatment options, risks and possible complications. To fulfill our ethical and legal obligations, as recommended by the American Medical Association's Code of Ethics, I have informed the patient of my clinical impression; the nature and purpose of the treatment or procedure; the risks, benefits, and possible complications of the intervention; the alternatives, including doing nothing; the risk(s)  and benefit(s) of the alternative treatment(s) or procedure(s); and the risk(s) and benefit(s) of doing nothing. The patient was provided information about the general risks and possible complications associated with the procedure. These may include, but are not limited to: failure to achieve  desired goals, infection, bleeding, organ or nerve damage, allergic reactions, paralysis, and death. In addition, the patient was informed of those risks and complications associated to the procedure, such as failure to decrease pain; infection; bleeding; organ or nerve damage with subsequent damage to sensory, motor, and/or autonomic systems, resulting in permanent pain, numbness, and/or weakness of one or several areas of the body; allergic reactions; (i.e.: anaphylactic reaction); and/or death. Furthermore, the patient was informed of those risks and complications associated with the medications. These include, but are not limited to: allergic reactions (i.e.: anaphylactic or anaphylactoid reaction(s)); adrenal axis suppression; blood sugar elevation that in diabetics may result in ketoacidosis or comma; water retention that in patients with history of congestive heart failure may result in shortness of breath, pulmonary edema, and decompensation with resultant heart failure; weight gain; swelling or edema; medication-induced neural toxicity; particulate matter embolism and blood vessel occlusion with resultant organ, and/or nervous system infarction; and/or aseptic necrosis of one or more joints. Finally, the patient was informed that Medicine is not an exact science; therefore, there is also the possibility of unforeseen or unpredictable risks and/or possible complications that may result in a catastrophic outcome. The patient indicated having understood very clearly. We have given the patient no guarantees and we have made no promises. Enough time was given to the patient to ask questions, all of which were answered  to the patient's satisfaction. Brandi Newton has indicated that she wanted to continue with the procedure. Attestation: I, the ordering provider, attest that I have discussed with the patient the benefits, risks, side-effects, alternatives, likelihood of achieving goals, and potential problems during recovery for the procedure that I have provided informed consent. Date  Time: 07/29/2021  9:14 AM  Pre-Procedure Preparation:  Monitoring: As per clinic protocol. Respiration, ETCO2, SpO2, BP, heart rate and rhythm monitor placed and checked for adequate function Safety Precautions: Patient was assessed for positional comfort and pressure points before starting the procedure. Time-out: I initiated and conducted the "Time-out" before starting the procedure, as per protocol. The patient was asked to participate by confirming the accuracy of the "Time Out" information. Verification of the correct person, site, and procedure were performed and confirmed by me, the nursing staff, and the patient. "Time-out" conducted as per Joint Commission's Universal Protocol (UP.01.01.01). Time: 1027  Description of Procedure:          Area Prepped: Entire Lower Lumbosacral Region DuraPrep (Iodine Povacrylex [0.7% available iodine] and Isopropyl Alcohol, 74% w/w) Safety Precautions: Aspiration looking for blood return was conducted prior to all injections. At no point did we inject any substances, as a needle was being advanced. No attempts were made at seeking any paresthesias. Safe injection practices and needle disposal techniques used. Medications properly checked for expiration dates. SDV (single dose vial) medications used. Description of the Procedure: Protocol guidelines were followed. The patient was placed in position over the fluoroscopy table. The target area was identified and the area prepped in the usual manner. Skin & deeper tissues infiltrated with local anesthetic. Appropriate amount of time allowed to pass for  local anesthetics to take effect. The procedure needles were then advanced to the target area. Proper needle placement secured. Negative aspiration confirmed. Solution injected in intermittent fashion, asking for systemic symptoms every 0.5cc of injectate. The needles were then removed and the area cleansed, making sure to leave some of the prepping solution back to take advantage of its long term bactericidal properties.  Vitals:   07/29/21 0914  BP:  129/67  Pulse: 73  Temp: (!) 96.6 F (35.9 C)  SpO2: 98%  Weight: 200 lb (90.7 kg)  Height: _0  (1.702 m)    Start Time: 1027 hrs. End Time: 1028 hrs. Materials:  Needle(s) Type: Epidural needle Gauge: 20G Length: 3.5-in Medication(s): Please see orders for medications and dosing details.  Imaging Guidance:          Type of Imaging Technique: None used Indication(s): N/A Exposure Time: No patient exposure Contrast: None used. Fluoroscopic Guidance: N/A Ultrasound Guidance: N/A Interpretation: N/A   Recent Visits Date Type Provider Dept  07/15/21 Procedure visit Milinda Pointer, MD [facility]  07/03/21 Office Visit Milinda Pointer, MD [facility]  06/19/21 Procedure visit Milinda Pointer, MD [facility]  05/28/21 Office Visit Milinda Pointer, MD [facility]  Showing recent visits within past 90 days and meeting all other requirements Today's Visits Date Type Provider Dept  07/29/21 Office Visit Milinda Pointer, MD [facility]  Showing today's visits and meeting all other requirements Future Appointments No visits were found meeting these conditions. Showing future appointments within next 90 days and meeting all other requirements I discussed the assessment and treatment plan with the patient. The patient was provided an opportunity to ask questions and all were answered. The patient agreed with the plan and demonstrated an understanding of the  instructions.  Patient advised to call back or seek an in-person evaluation if the symptoms or condition worsens.  Duration of encounter: 35 minutes.  Note by: Gaspar Cola, MD Date: 07/29/2021; Time: 5:18 PM

## 2021-07-29 ENCOUNTER — Ambulatory Visit: Payer: Medicare PPO | Attending: Pain Medicine | Admitting: Pain Medicine

## 2021-07-29 ENCOUNTER — Encounter: Payer: Self-pay | Admitting: Pain Medicine

## 2021-07-29 ENCOUNTER — Other Ambulatory Visit: Payer: Self-pay

## 2021-07-29 VITALS — BP 129/67 | HR 73 | Temp 96.6°F | Ht 67.0 in | Wt 200.0 lb

## 2021-07-29 DIAGNOSIS — M25562 Pain in left knee: Secondary | ICD-10-CM | POA: Diagnosis present

## 2021-07-29 DIAGNOSIS — M25561 Pain in right knee: Secondary | ICD-10-CM | POA: Diagnosis present

## 2021-07-29 DIAGNOSIS — G8929 Other chronic pain: Secondary | ICD-10-CM | POA: Diagnosis present

## 2021-07-29 DIAGNOSIS — M47816 Spondylosis without myelopathy or radiculopathy, lumbar region: Secondary | ICD-10-CM | POA: Diagnosis not present

## 2021-07-29 DIAGNOSIS — Z96653 Presence of artificial knee joint, bilateral: Secondary | ICD-10-CM | POA: Insufficient documentation

## 2021-07-29 DIAGNOSIS — M5416 Radiculopathy, lumbar region: Secondary | ICD-10-CM | POA: Insufficient documentation

## 2021-07-29 DIAGNOSIS — M549 Dorsalgia, unspecified: Secondary | ICD-10-CM | POA: Insufficient documentation

## 2021-07-29 DIAGNOSIS — M545 Low back pain, unspecified: Secondary | ICD-10-CM | POA: Diagnosis not present

## 2021-07-29 DIAGNOSIS — M7918 Myalgia, other site: Secondary | ICD-10-CM | POA: Insufficient documentation

## 2021-07-29 DIAGNOSIS — G8928 Other chronic postprocedural pain: Secondary | ICD-10-CM | POA: Diagnosis present

## 2021-07-29 MED ORDER — ROPIVACAINE HCL 2 MG/ML IJ SOLN
9.0000 mL | Freq: Once | INTRAMUSCULAR | Status: AC
  Administered 2021-07-29: 9 mL

## 2021-07-29 MED ORDER — TRIAMCINOLONE ACETONIDE 40 MG/ML IJ SUSP
40.0000 mg | Freq: Once | INTRAMUSCULAR | Status: AC
  Administered 2021-07-29: 40 mg
  Filled 2021-07-29: qty 1

## 2021-07-29 NOTE — Patient Instructions (Signed)
____________________________________________________________________________________________  Post-Procedure Discharge Instructions  Instructions: Apply ice:  Purpose: This will minimize any swelling and discomfort after procedure.  When: Day of procedure, as soon as you get home. How: Fill a plastic sandwich bag with crushed ice. Cover it with a small towel and apply to injection site. How long: (15 min on, 15 min off) Apply for 15 minutes then remove x 15 minutes.  Repeat sequence on day of procedure, until you go to bed. Apply heat:  Purpose: To treat any soreness and discomfort from the procedure. When: Starting the next day after the procedure. How: Apply heat to procedure site starting the day following the procedure. How long: May continue to repeat daily, until discomfort goes away. Food intake: Start with clear liquids (like water) and advance to regular food, as tolerated.  Physical activities: Keep activities to a minimum for the first 8 hours after the procedure. After that, then as tolerated. Driving: If you have received any sedation, be responsible and do not drive. You are not allowed to drive for 24 hours after having sedation. Blood thinner: (Applies only to those taking blood thinners) You may restart your blood thinner 6 hours after your procedure. Insulin: (Applies only to Diabetic patients taking insulin) As soon as you can eat, you may resume your normal dosing schedule. Infection prevention: Keep procedure site clean and dry. Shower daily and clean area with soap and water. Post-procedure Pain Diary: Extremely important that this be done correctly and accurately. Recorded information will be used to determine the next step in treatment. For the purpose of accuracy, follow these rules: Evaluate only the area treated. Do not report or include pain from an untreated area. For the purpose of this evaluation, ignore all other areas of pain, except for the treated area. After  your procedure, avoid taking a long nap and attempting to complete the pain diary after you wake up. Instead, set your alarm clock to go off every hour, on the hour, for the initial 8 hours after the procedure. Document the duration of the numbing medicine, and the relief you are getting from it. Do not go to sleep and attempt to complete it later. It will not be accurate. If you received sedation, it is likely that you were given a medication that may cause amnesia. Because of this, completing the diary at a later time may cause the information to be inaccurate. This information is needed to plan your care. Follow-up appointment: Keep your post-procedure follow-up evaluation appointment after the procedure (usually 2 weeks for most procedures, 6 weeks for radiofrequencies). DO NOT FORGET to bring you pain diary with you.   Expect: (What should I expect to see with my procedure?) From numbing medicine (AKA: Local Anesthetics): Numbness or decrease in pain. You may also experience some weakness, which if present, could last for the duration of the local anesthetic. Onset: Full effect within 15 minutes of injected. Duration: It will depend on the type of local anesthetic used. On the average, 1 to 8 hours.  From steroids (Applies only if steroids were used): Decrease in swelling or inflammation. Once inflammation is improved, relief of the pain will follow. Onset of benefits: Depends on the amount of swelling present. The more swelling, the longer it will take for the benefits to be seen. In some cases, up to 10 days. Duration: Steroids will stay in the system x 2 weeks. Duration of benefits will depend on multiple posibilities including persistent irritating factors. Side-effects: If present, they  may typically last 2 weeks (the duration of the steroids). Frequent: Cramps (if they occur, drink Gatorade and take over-the-counter Magnesium 450-500 mg once to twice a day); water retention with temporary  weight gain; increases in blood sugar; decreased immune system response; increased appetite. Occasional: Facial flushing (red, warm cheeks); mood swings; menstrual changes. Uncommon: Long-term decrease or suppression of natural hormones; bone thinning. (These are more common with higher doses or more frequent use. This is why we prefer that our patients avoid having any injection therapies in other practices.)  Very Rare: Severe mood changes; psychosis; aseptic necrosis. From procedure: Some discomfort is to be expected once the numbing medicine wears off. This should be minimal if ice and heat are applied as instructed.  Call if: (When should I call?) You experience numbness and weakness that gets worse with time, as opposed to wearing off. New onset bowel or bladder incontinence. (Applies only to procedures done in the spine)  Emergency Numbers: Durning business hours (Monday - Thursday, 8:00 AM - 4:00 PM) (Friday, 9:00 AM - 12:00 Noon): [phone] After hours: [phone] NOTE: If you are having a problem and are unable connect with, or to talk to a provider, then go to your nearest urgent care or emergency department. If the problem is serious and urgent, please call 911. ____________________________________________________________________________________________  ______________________________________________________________________  Preparing for Procedure with Sedation  NOTICE: Due to recent regulatory changes, starting on July 07, 2021, procedures requiring intravenous (IV) sedation will no longer be performed at the [facility].  These types of procedures are required to be performed at [facility] ambulatory surgery facility.  We are very sorry for the inconvenience.  Procedure appointments are limited to planned procedures: No Prescription Refills. No disability issues will be discussed. No medication changes will be discussed.  Instructions: Oral Intake: Do not eat  or drink anything for at least 8 hours prior to your procedure. (Exception: Blood Pressure Medication. See below.) Transportation: A driver is required. You may not drive yourself after the procedure. Blood Pressure Medicine: Do not forget to take your blood pressure medicine with a sip of water the morning of the procedure. If your Diastolic (lower reading) is above 100 mmHg, elective cases will be cancelled/rescheduled. Blood thinners: These will need to be stopped for procedures. Notify our staff if you are taking any blood thinners. Depending on which one you take, there will be specific instructions on how and when to stop it. Diabetics on insulin: Notify the staff so that you can be scheduled 1st case in the morning. If your diabetes requires high dose insulin, take only  of your normal insulin dose the morning of the procedure and notify the staff that you have done so. Preventing infections: Shower with an antibacterial soap the morning of your procedure. Build-up your immune system: Take 1000 mg of Vitamin C with every meal (3 times a day) the day prior to your procedure. Antibiotics: Inform the staff if you have a condition or reason that requires you to take antibiotics before dental procedures. Pregnancy: If you are pregnant, call and cancel the procedure. Sickness: If you have a cold, fever, or any active infections, call and cancel the procedure. Arrival: You must be in the facility at least 30 minutes prior to your scheduled procedure. Children: Do not bring children with you. Dress appropriately: Bring dark clothing that you would not mind if they get stained. Valuables: Do not bring any jewelry or valuables.  Reasons to call and reschedule  or cancel your procedure: (Following these recommendations will minimize the risk of a serious complication.) Surgeries: Avoid having procedures within 2 weeks of any surgery. (Avoid for 2 weeks before or after any surgery). Flu Shots: Avoid  having procedures within 2 weeks of a flu shots. (Avoid for 2 weeks before or after immunizations). Barium: Avoid having a procedure within 7-10 days after having had a radiological study involving the use of radiological contrast. (Myelograms, Barium swallow or enema study). Heart attacks: Avoid any elective procedures or surgeries for the initial 6 months after a "Myocardial Infarction" (Heart Attack). Blood thinners: It is imperative that you stop these medications before procedures. Let us know if you if you take any blood thinner.  Infection: Avoid procedures during or within two weeks of an infection (including chest colds or gastrointestinal problems). Symptoms associated with infections include: Localized redness, fever, chills, night sweats or profuse sweating, burning sensation when voiding, cough, congestion, stuffiness, runny nose, sore throat, diarrhea, nausea, vomiting, cold or Flu symptoms, recent or current infections. It is specially important if the infection is over the area that we intend to treat. Heart and lung problems: Symptoms that may suggest an active cardiopulmonary problem include: cough, chest pain, breathing difficulties or shortness of breath, dizziness, ankle swelling, uncontrolled high or unusually low blood pressure, and/or palpitations. If you are experiencing any of these symptoms, cancel your procedure and contact your primary care physician for an evaluation.  Remember:  Regular Business hours are:  Monday to Thursday 8:00 AM to 4:00 PM  Provider's Schedule: Milinda Pointer, MD:  Procedure days: Tuesday and Thursday 7:30 AM to 4:00 PM  Gillis Santa, MD:  Procedure days: Monday and Wednesday 7:30 AM to 4:00 PM ______________________________________________________________________

## 2021-08-26 ENCOUNTER — Ambulatory Visit: Payer: Medicare PPO | Admitting: Pain Medicine

## 2021-09-10 NOTE — Progress Notes (Addendum)
PROVIDER NOTE: Information contained herein reflects review and annotations entered in association with encounter. Interpretation of such information and data should be left to medically-trained personnel. Information provided to patient can be located elsewhere in the medical record under "Patient Instructions". Document created using STT-dictation technology, any transcriptional errors that may result from process are unintentional.    Patient: Brandi Newton  Service Category: Procedure  Provider: Gaspar Cola, MD  DOB: 1946-03-28  DOS: 09/11/2021  Location: [facility]  MRN: [id number]  Setting: Ambulatory - outpatient  Referring Provider: Milinda Pointer, MD  Type: Established Patient  Specialty: Interventional Pain Management  PCP: Ranae Plumber, [place]   Primary Reason for Visit: Interventional Pain Management Treatment. CC: Back Pain (Lumbar bilateral left is worse.) and Leg Pain (Bilateral, peripheral neuropathy )   Procedure:          Anesthesia, Analgesia, Anxiolysis:  Type: Lumbar Facet, Medial Branch Block(s)          Primary Purpose: Diagnostic Region: Posterolateral Lumbosacral Spine Level: L2, L3, L4, L5, & S1 Medial Branch Level(s). Injecting these levels blocks the L3-4, L4-5, and L5-S1 lumbar facet joints. Laterality: Bilateral  Type: Local Anesthesia Local Anesthetic: Lidocaine 1-2% Sedation: Minimal Anxiolysis  Indication(s): Anxiety & Analgesia Route: Infiltration ([place]/IM) IV Access: Available   Position: Prone   Indications: 1. Lumbar facet syndrome (Bilateral)   2. Spondylosis without myelopathy or radiculopathy, lumbosacral region   3. Lumbar facet hypertrophy (Multilevel) (Bilateral)   4. Lumbar Grade 1 Retrolisthesis of L3/L4   5. DDD (degenerative disc disease), lumbosacral   6. Chronic low back pain (1ry area of Pain) (Bilateral) (R>L) w/o sciatica    Pain Score: Pre-procedure: 5 /10 Post-procedure: 0-No pain/10     Pre-op H&P  Assessment:  Brandi Newton is a 75 y.o. (year old), female patient, seen today for interventional treatment. 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 Brandi Newton has a current medication list which includes the following prescription(s): acetaminophen, albuterol, vitamin c, atorvastatin, cetirizine, chlorthalidone, cholecalciferol, cyanocobalamin, escitalopram, fluticasone, gabapentin, losartan, polyethylene glycol, and valacyclovir. Her primarily concern today is the Back Pain (Lumbar bilateral left is worse.) and Leg Pain (Bilateral, peripheral neuropathy )  Initial Vital Signs:  Pulse/HCG Rate: 66  Temp: (!) 97.1 F (36.2 C) Resp: 16 BP: (!) 152/75 SpO2: 96 %  BMI: Estimated body mass index is 31.32 kg/m as calculated from the following:   Height as of this encounter: 5\' 7"  (1.702 m).   Weight as of this encounter: 200 lb (90.7 kg).  Risk Assessment: Allergies: Reviewed. She is allergic to percocet [oxycodone-acetaminophen] and ace inhibitors.  Allergy Precautions: None required Coagulopathies: Reviewed. None identified.  Blood-thinner therapy: None at this time Active Infection(s): Reviewed. None identified. Brandi Newton is afebrile  Site Confirmation: Brandi Newton was asked to confirm the procedure and laterality before marking the site Procedure checklist: Completed Consent: Before the procedure and under the influence of no sedative(s), amnesic(s), or anxiolytics, the patient was informed of the treatment options, risks and possible complications. To fulfill our ethical and legal obligations, as recommended by the American Medical Association's  Code of Ethics, I have informed the patient of my clinical impression; the nature and purpose of the treatment or procedure; the risks,  benefits, and possible complications of the intervention; the alternatives, including doing nothing; the risk(s) and benefit(s) of the alternative treatment(s) or procedure(s); and the risk(s) and benefit(s) of doing nothing. The patient was provided information about the general risks and possible complications associated with the procedure. These may include, but are not limited to: failure to achieve desired goals, infection, bleeding, organ or nerve damage, allergic reactions, paralysis, and death. In addition, the patient was informed of those risks and complications associated to Spine-related procedures, such as failure to decrease pain; infection (i.e.: Meningitis, epidural or intraspinal abscess); bleeding (i.e.: epidural hematoma, subarachnoid hemorrhage, or any other type of intraspinal or peri-dural bleeding); organ or nerve damage (i.e.: Any type of peripheral nerve, nerve root, or spinal cord injury) with subsequent damage to sensory, motor, and/or autonomic systems, resulting in permanent pain, numbness, and/or weakness of one or several areas of the body; allergic reactions; (i.e.: anaphylactic reaction); and/or death. Furthermore, the patient was informed of those risks and complications associated with the medications. These include, but are not limited to: allergic reactions (i.e.: anaphylactic or anaphylactoid reaction(s)); adrenal axis suppression; blood sugar elevation that in diabetics may result in ketoacidosis or comma; water retention that in patients with history of congestive heart failure may result in shortness of breath, pulmonary edema, and decompensation with resultant heart failure; weight gain; swelling or edema; medication-induced neural toxicity; particulate matter embolism and blood vessel occlusion with resultant organ, and/or nervous system  infarction; and/or aseptic necrosis of one or more joints. Finally, the patient was informed that Medicine is not an exact science; therefore, there is also the possibility of unforeseen or unpredictable risks and/or possible complications that may result in a catastrophic outcome. The patient indicated having understood very clearly. We have given the patient no guarantees and we have made no promises. Enough time was given to the patient to ask questions, all of which were answered to the patient's satisfaction. Ms. Rosales has indicated that she wanted to continue with the procedure. Attestation: I, the ordering provider, attest that I have discussed with the patient the benefits, risks, side-effects, alternatives, likelihood of achieving goals, and potential problems during recovery for the procedure that I have provided informed consent. Date  Time: 09/11/2021  8:38 AM  Pre-Procedure Preparation:  Monitoring: As per clinic protocol. Respiration, ETCO2, SpO2, BP, heart rate and rhythm monitor placed and checked for adequate function Safety Precautions: Patient was assessed for positional comfort and pressure points before starting the procedure. Time-out: I initiated and conducted the "Time-out" before starting the procedure, as per protocol. The patient was asked to participate by confirming the accuracy of the "Time Out" information. Verification of the correct person, site, and procedure were performed and confirmed by me, the nursing staff, and the patient. "Time-out" conducted as per Joint Commission's Universal Protocol (UP.01.01.01). Time: 0925  Description of Procedure:          Laterality: Bilateral. The procedure was performed in identical fashion on both sides. Levels:  L2, L3, L4, L5, & S1 Medial Branch Level(s) Area Prepped: Posterior Lumbosacral Region DuraPrep (Iodine Povacrylex [0.7% available iodine] and Isopropyl Alcohol, 74% w/w) Safety Precautions: Aspiration looking for blood  return was conducted prior to all injections. At no point did we inject any substances, as a needle was being advanced. Before injecting, the patient was told to immediately notify me if she was experiencing any new onset of "ringing in the ears, or metallic taste in the mouth". No attempts were made at seeking any  paresthesias. Safe injection practices and needle disposal techniques used. Medications properly checked for expiration dates. SDV (single dose vial) medications used. After the completion of the procedure, all disposable equipment used was discarded in the proper designated medical waste containers. Local Anesthesia: Protocol guidelines were followed. The patient was positioned over the fluoroscopy table. The area was prepped in the usual manner. The time-out was completed. The target area was identified using fluoroscopy. A 12-in long, straight, sterile hemostat was used with fluoroscopic guidance to locate the targets for each level blocked. Once located, the skin was marked with an approved surgical skin marker. Once all sites were marked, the skin (epidermis, dermis, and hypodermis), as well as deeper tissues (fat, connective tissue and muscle) were infiltrated with a small amount of a short-acting local anesthetic, loaded on a 10cc syringe with a 25G, 1.5-in  Needle. An appropriate amount of time was allowed for local anesthetics to take effect before proceeding to the next step. Local Anesthetic: Lidocaine 2.0% The unused portion of the local anesthetic was discarded in the proper designated containers. Technical explanation of process:  L2 Medial Branch Nerve Block (MBB): The target area for the L2 medial branch is at the junction of the postero-lateral aspect of the superior articular process and the superior, posterior, and medial edge of the transverse process of L3. Under fluoroscopic guidance, a Quincke needle was inserted until contact was made with os over the superior postero-lateral  aspect of the pedicular shadow (target area). After negative aspiration for blood, 0.5 mL of the nerve block solution was injected without difficulty or complication. The needle was removed intact. L3 Medial Branch Nerve Block (MBB): The target area for the L3 medial branch is at the junction of the postero-lateral aspect of the superior articular process and the superior, posterior, and medial edge of the transverse process of L4. Under fluoroscopic guidance, a Quincke needle was inserted until contact was made with os over the superior postero-lateral aspect of the pedicular shadow (target area). After negative aspiration for blood, 0.5 mL of the nerve block solution was injected without difficulty or complication. The needle was removed intact. L4 Medial Branch Nerve Block (MBB): The target area for the L4 medial branch is at the junction of the postero-lateral aspect of the superior articular process and the superior, posterior, and medial edge of the transverse process of L5. Under fluoroscopic guidance, a Quincke needle was inserted until contact was made with os over the superior postero-lateral aspect of the pedicular shadow (target area). After negative aspiration for blood, 0.5 mL of the nerve block solution was injected without difficulty or complication. The needle was removed intact. L5 Medial Branch Nerve Block (MBB): The target area for the L5 medial branch is at the junction of the postero-lateral aspect of the superior articular process and the superior, posterior, and medial edge of the sacral ala. Under fluoroscopic guidance, a Quincke needle was inserted until contact was made with os over the superior postero-lateral aspect of the pedicular shadow (target area). After negative aspiration for blood, 0.5 mL of the nerve block solution was injected without difficulty or complication. The needle was removed intact. S1 Medial Branch Nerve Block (MBB): The target area for the S1 medial branch is  at the posterior and inferior 6 o'clock position of the L5-S1 facet joint. Under fluoroscopic guidance, the Quincke needle inserted for the L5 MBB was redirected until contact was made with os over the inferior and postero aspect of the sacrum, at the 6  o' clock position under the L5-S1 facet joint (Target area). After negative aspiration for blood, 0.5 mL of the nerve block solution was injected without difficulty or complication. The needle was removed intact.  Nerve block solution: 0.2% PF-Ropivacaine + Triamcinolone (40 mg/mL) diluted to a final concentration of 4 mg of Triamcinolone/mL of Ropivacaine The unused portion of the solution was discarded in the proper designated containers. Procedural Needles: 22-gauge, 3.5-inch, Quincke needles used for all levels.  Once the entire procedure was completed, the treated area was cleaned, making sure to leave some of the prepping solution back to take advantage of its long term bactericidal properties.      Illustration of the posterior view of the lumbar spine and the posterior neural structures. Laminae of L2 through S1 are labeled. DPRL5, dorsal primary ramus of L5; DPRS1, dorsal primary ramus of S1; DPR3, dorsal primary ramus of L3; FJ, facet (zygapophyseal) joint L3-L4; I, inferior articular process of L4; LB1, lateral branch of dorsal primary ramus of L1; IAB, inferior articular branches from L3 medial branch (supplies L4-L5 facet joint); IBP, intermediate branch plexus; MB3, medial branch of dorsal primary ramus of L3; NR3, third lumbar nerve root; S, superior articular process of L5; SAB, superior articular branches from L4 (supplies L4-5 facet joint also); TP3, transverse process of L3.  Vitals:   09/11/21 0935 09/11/21 0940 09/11/21 0950 09/11/21 0959  BP: 122/90 135/61 134/64 136/62  Pulse: 67 62 64 64  Resp: 15 15 16 16   Temp:  (!) 97.5 F (36.4 C)  (!) 97.3 F (36.3 C)  TempSrc:      SpO2: 95% 98% 99% 99%  Weight:      Height:          Start Time: 0925 hrs. End Time: 0934 hrs.  Imaging Guidance (Spinal):          Type of Imaging Technique: Fluoroscopy Guidance (Spinal) Indication(s): Assistance in needle guidance and placement for procedures requiring needle placement in or near specific anatomical locations not easily accessible without such assistance. Exposure Time: Please see nurses notes. Contrast: None used. Fluoroscopic Guidance: I was personally present during the use of fluoroscopy. "Tunnel Vision Technique" used to obtain the best possible view of the target area. Parallax error corrected before commencing the procedure. "Direction-depth-direction" technique used to introduce the needle under continuous pulsed fluoroscopy. Once target was reached, antero-posterior, oblique, and lateral fluoroscopic projection used confirm needle placement in all planes. Images permanently stored in EMR. Interpretation: No contrast injected. I personally interpreted the imaging intraoperatively. Adequate needle placement confirmed in multiple planes. Permanent images saved into the patient's record.  Antibiotic Prophylaxis:   Anti-infectives (From admission, onward)    None      Indication(s): None identified  Post-operative Assessment:  Post-procedure Vital Signs:  Pulse/HCG Rate: 64  Temp: (!) 97.3 F (36.3 C) Resp: 16 BP: 136/62 SpO2: 99 %  EBL: None  Complications: No immediate post-treatment complications observed by team, or reported by patient.  Note: The patient tolerated the entire procedure well. A repeat set of vitals were taken after the procedure and the patient was kept under observation following institutional policy, for this type of procedure. Post-procedural neurological assessment was performed, showing return to baseline, prior to discharge. The patient was provided with post-procedure discharge instructions, including a section on how to identify potential problems. Should any problems arise  concerning this procedure, the patient was given instructions to immediately contact us, at any time, without hesitation. In any case, we plan  to contact the patient by telephone for a follow-up status report regarding this interventional procedure.  Comments:  No additional relevant information.  Plan of Care  Orders:  Orders Placed This Encounter  Procedures   LUMBAR FACET(MEDIAL BRANCH NERVE BLOCK) MBNB    Scheduling Instructions:     Procedure: Lumbar facet block (AKA.: Lumbosacral medial branch nerve block)     Side: Bilateral     Level: L3-4, L4-5, & L5-S1 Facets (L2, L3, L4, L5, & S1 Medial Branch Nerves)     Sedation: Patient's choice.     Timeframe: Today    Order Specific Question:   Where will this procedure be performed?    Answer:   [facility]   DG [facility] C-ARM 1-60 MIN NO REPORT    Intraoperative interpretation by procedural physician at [place].    Standing Status:   Standing    Number of Occurrences:   1    Order Specific Question:   Reason for exam:    Answer:   Assistance in needle guidance and placement for procedures requiring needle placement in or near specific anatomical locations not easily accessible without such assistance.   Informed Consent Details: Physician/Practitioner Attestation; Transcribe to consent form and obtain patient signature    Nursing Order: Transcribe to consent form and obtain patient signature. Note: Always confirm laterality of pain with Ms. Gubbels, before procedure.    Order Specific Question:   Physician/Practitioner attestation of informed consent for procedure/surgical case    Answer:   I, the physician/practitioner, attest that I have discussed with the patient the benefits, risks, side effects, alternatives, likelihood of achieving goals and potential problems during recovery for the procedure that I have provided informed consent.    Order Specific Question:   Procedure    Answer:   Lumbar Facet Block   under fluoroscopic guidance    Order Specific Question:   Physician/Practitioner performing the procedure    Answer:   Narely Nobles A. Dossie Arbour MD    Order Specific Question:   Indication/Reason    Answer:   Low Back Pain, with our without leg pain, due to Facet Joint Arthralgia (Joint Pain) Spondylosis (Arthritis of the Spine), without myelopathy or radiculopathy (Nerve Damage).   Provide equipment / supplies at bedside    "Block Tray" (Disposable  single use) Needle type: SpinalSpinal Amount/quantity: 4 Size: Regular (3.5-inch) Gauge: 22G    Standing Status:   Standing    Number of Occurrences:   1    Order Specific Question:   Specify    Answer:   Block Tray    Chronic Opioid Analgesic:  None MME/day: 0 mg/day   Medications ordered for procedure: Meds ordered this encounter  Medications   lidocaine (XYLOCAINE) 2 % (with pres) injection 400 mg   lactated ringers infusion 1,000 mL   midazolam (VERSED) 5 MG/5ML injection 0.5-2 mg    Make sure Flumazenil is available in the pyxis when using this medication. If oversedation occurs, administer 0.2 mg IV over 15 sec. If after 45 sec no response, administer 0.2 mg again over 1 min; may repeat at 1 min intervals; not to exceed 4 doses (1 mg)   ropivacaine (PF) 2 mg/mL (0.2%) (NAROPIN) injection 18 mL   triamcinolone acetonide (KENALOG-40) injection 80 mg    Medications administered: We administered lidocaine, lactated ringers, midazolam, ropivacaine (PF) 2 mg/mL (0.2%), and triamcinolone acetonide.  See the medical record for exact dosing, route, and time of administration.  Follow-up plan:  Return in about 2 weeks (around 09/25/2021) for Proc-day (T,Th), (VV), (PPE).       Interventional Therapies  Risk  Complexity Considerations:   Estimated body mass index is 32.11 kg/m as calculated from the following:   Height as of this encounter: 5\' 7"  (1.702 m).   Weight as of this encounter: 205 lb (93 kg). WNL   Planned  Pending:    Therapeutic bilateral PSIS trigger point injection x2 (07/29/2021)  Diagnostic bilateral lumbar facet block #2    Under consideration:   Diagnostic bilateral lumbar facet block #2  Diagnostic left sacroiliac joint block #1    Completed:   Diagnostic/therapeutic bilateral lumbar (PSIS) trigger point injections x1 (03/20/2021) (20/30/70/70)  Diagnostic bilateral lumbar facet block x1 (04/17/2021) (100/100/100)    Therapeutic  Palliative (PRN) options:   None established    Recent Visits Date Type Provider Dept  07/29/21 Office Visit Milinda Pointer, MD [facility]  07/15/21 Procedure visit Milinda Pointer, MD [facility]  07/03/21 Office Visit Milinda Pointer, MD [facility]  06/19/21 Procedure visit Milinda Pointer, MD [facility]  Showing recent visits within past 90 days and meeting all other requirements Today's Visits Date Type Provider Dept  09/11/21 Procedure visit Milinda Pointer, MD [facility]  Showing today's visits and meeting all other requirements Future Appointments Date Type Provider Dept  09/25/21 Appointment Milinda Pointer, MD [facility]  Showing future appointments within next 90 days and meeting all other requirements Disposition: Discharge home  Discharge (Date  Time): 09/11/2021; 1000 hrs.   Primary Care Physician: Ranae Plumber, PA Location: [facility] Note by: Gaspar Cola, MD Date: 09/11/2021; Time: 2:07 PM  Disclaimer:  Medicine is not an Chief Strategy Officer. The only guarantee in medicine is that nothing is guaranteed. It is important to note that the decision to proceed with this intervention was based on the information collected from the patient. The Data and conclusions were drawn from the patient's questionnaire, the interview, and the physical examination. Because the information was provided in large part by the patient, it cannot be  guaranteed that it has not been purposely or unconsciously manipulated. Every effort has been made to obtain as much relevant data as possible for this evaluation. It is important to note that the conclusions that lead to this procedure are derived in large part from the available data. Always take into account that the treatment will also be dependent on availability of resources and existing treatment guidelines, considered by other Pain Management Practitioners as being common knowledge and practice, at the time of the intervention. For Medico-Legal purposes, it is also important to point out that variation in procedural techniques and pharmacological choices are the acceptable norm. The indications, contraindications, technique, and results of the above procedure should only be interpreted and judged by a Board-Certified Interventional Pain Specialist with extensive familiarity and expertise in the same exact procedure and technique.

## 2021-09-11 ENCOUNTER — Ambulatory Visit
Admission: RE | Admit: 2021-09-11 | Discharge: 2021-09-11 | Disposition: A | Discharge status: Home / Self Care | Payer: Medicare PPO | Source: Ambulatory Visit | Attending: Pain Medicine | Admitting: Pain Medicine

## 2021-09-11 ENCOUNTER — Encounter: Payer: Self-pay | Admitting: Pain Medicine

## 2021-09-11 ENCOUNTER — Ambulatory Visit (HOSPITAL_BASED_OUTPATIENT_CLINIC_OR_DEPARTMENT_OTHER): Payer: Medicare PPO | Admitting: Pain Medicine

## 2021-09-11 ENCOUNTER — Other Ambulatory Visit: Payer: Self-pay

## 2021-09-11 VITALS — BP 136/62 | HR 64 | Temp 97.3°F | Resp 16 | Ht 67.0 in | Wt 200.0 lb

## 2021-09-11 DIAGNOSIS — M47817 Spondylosis without myelopathy or radiculopathy, lumbosacral region: Secondary | ICD-10-CM

## 2021-09-11 DIAGNOSIS — M47816 Spondylosis without myelopathy or radiculopathy, lumbar region: Secondary | ICD-10-CM | POA: Diagnosis present

## 2021-09-11 DIAGNOSIS — M5137 Other intervertebral disc degeneration, lumbosacral region: Secondary | ICD-10-CM

## 2021-09-11 DIAGNOSIS — G8929 Other chronic pain: Secondary | ICD-10-CM | POA: Diagnosis present

## 2021-09-11 DIAGNOSIS — M4310 Spondylolisthesis, site unspecified: Secondary | ICD-10-CM | POA: Insufficient documentation

## 2021-09-11 DIAGNOSIS — M545 Low back pain, unspecified: Secondary | ICD-10-CM | POA: Insufficient documentation

## 2021-09-11 MED ORDER — ROPIVACAINE HCL 2 MG/ML IJ SOLN
INTRAMUSCULAR | Status: AC
  Filled 2021-09-11: qty 20

## 2021-09-11 MED ORDER — TRIAMCINOLONE ACETONIDE 40 MG/ML IJ SUSP
80.0000 mg | Freq: Once | INTRAMUSCULAR | Status: AC
  Administered 2021-09-11: 80 mg
  Filled 2021-09-11: qty 2

## 2021-09-11 MED ORDER — LIDOCAINE HCL 2 % IJ SOLN
20.0000 mL | Freq: Once | INTRAMUSCULAR | Status: AC
Start: 2021-09-11 — End: 2021-09-11
  Administered 2021-09-11: 400 mg
  Filled 2021-09-11: qty 20

## 2021-09-11 MED ORDER — ROPIVACAINE HCL 2 MG/ML IJ SOLN
18.0000 mL | Freq: Once | INTRAMUSCULAR | Status: AC
Start: 2021-09-11 — End: 2021-09-11
  Administered 2021-09-11: 18 mL via PERINEURAL

## 2021-09-11 MED ORDER — MIDAZOLAM HCL 5 MG/5ML IJ SOLN
0.5000 mg | Freq: Once | INTRAMUSCULAR | Status: AC
  Administered 2021-09-11: 2 mg via INTRAVENOUS
  Filled 2021-09-11: qty 5

## 2021-09-11 MED ORDER — LACTATED RINGERS IV SOLN
1000.0000 mL | Freq: Once | INTRAVENOUS | Status: AC
  Administered 2021-09-11: 1000 mL via INTRAVENOUS

## 2021-09-11 NOTE — Patient Instructions (Signed)

## 2021-09-11 NOTE — Progress Notes (Signed)
Safety precautions to be maintained throughout the outpatient stay will include: orient to surroundings, keep bed in low position, maintain call bell within reach at all times, provide assistance with transfer out of bed and ambulation.  

## 2021-09-12 ENCOUNTER — Telehealth: Payer: Self-pay | Admitting: *Deleted

## 2021-09-12 NOTE — Telephone Encounter (Signed)
Attempted to call for post procedure follow-up. Line busy.

## 2021-09-25 ENCOUNTER — Ambulatory Visit: Payer: Medicare PPO | Attending: Pain Medicine | Admitting: Pain Medicine

## 2021-09-25 ENCOUNTER — Encounter: Payer: Self-pay | Admitting: Pain Medicine

## 2021-09-25 ENCOUNTER — Other Ambulatory Visit: Payer: Self-pay

## 2021-09-25 DIAGNOSIS — M4310 Spondylolisthesis, site unspecified: Secondary | ICD-10-CM

## 2021-09-25 DIAGNOSIS — G8929 Other chronic pain: Secondary | ICD-10-CM

## 2021-09-25 DIAGNOSIS — M47816 Spondylosis without myelopathy or radiculopathy, lumbar region: Secondary | ICD-10-CM

## 2021-09-25 DIAGNOSIS — M5137 Other intervertebral disc degeneration, lumbosacral region: Secondary | ICD-10-CM

## 2021-09-25 DIAGNOSIS — M545 Low back pain, unspecified: Secondary | ICD-10-CM

## 2021-09-25 DIAGNOSIS — M47817 Spondylosis without myelopathy or radiculopathy, lumbosacral region: Secondary | ICD-10-CM | POA: Diagnosis not present

## 2021-09-25 NOTE — Progress Notes (Signed)
Patient: Brandi Newton  Service Category: E/M  Provider: Gaspar Cola, MD  DOB: [birth year]  DOS: 09/25/2021  Location: Office  MRN: [id number]  Setting: Ambulatory outpatient  Referring Provider: Ranae Plumber, PA  Type: Established Patient  Specialty: Interventional Pain Management  PCP: Ranae Plumber, PA  Location: Remote location  Delivery: TeleHealth     Virtual Encounter - Pain Management PROVIDER NOTE: Information contained herein reflects review and annotations entered in association with encounter. Interpretation of such information and data should be left to medically-trained personnel. Information provided to patient can be located elsewhere in the medical record under "Patient Instructions". Document created using STT-dictation technology, any transcriptional errors that may result from process are unintentional.    Contact & Pharmacy Preferred: [phone] Home: [phone] (home) Mobile: [phone] (mobile) E-mail: [email or web address] [place], [place] [address] Phone: [phone] Fax: [phone]  [place] [address] [place] - [place]) [place] [address] Phone: [phone] Fax: [phone]   Pre-screening  Brandi Newton offered "in-person" vs "virtual" encounter. She indicated preferring virtual for this encounter.   Reason COVID-19*  Social distancing based on CDC and AMA recommendations.   I contacted Brandi Newton on 09/25/2021 via telephone.      I clearly identified myself as Gaspar Cola, MD. I verified that I was speaking with the correct person using two identifiers (Name: Brandi Newton, and date of birth: 04-11-1946).  Consent I sought verbal advanced consent from Brandi Newton for virtual visit interactions. I informed Brandi Newton of possible security and privacy concerns, risks, and limitations  associated with providing "not-in-person" medical evaluation and management services. I also informed Brandi Newton of the availability of "in-person" appointments. Finally, I informed her that there would be a charge for the virtual visit and that she could be  personally, fully or partially, financially responsible for it. Brandi Newton expressed understanding and agreed to proceed.   Historic Elements   Brandi Newton is a 75 y.o. year old, female patient evaluated today after our last contact on 09/11/2021. Brandi Newton  has a past medical history of Anemia, Arthritis, Asthma, Bilateral ovarian cysts, Bronchitis, Constipation, Family history of adverse reaction to anesthesia, GERD (gastroesophageal reflux disease), H/O seasonal allergies, History of kidney stones, Hypertension, PONV (postoperative nausea and vomiting), and Sinus problem. 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 Brandi Newton has a current medication list which includes the following prescription(s): acetaminophen, albuterol, vitamin c, atorvastatin, cetirizine, chlorthalidone, cholecalciferol, cyanocobalamin, escitalopram, fluticasone, losartan, polyethylene glycol, valacyclovir, and gabapentin. She  reports that she quit smoking about 42 years ago. Her smoking use included cigarettes. She has a 10.00 pack-year smoking history. She has never used smokeless tobacco. She reports current alcohol use. She reports that she does not use drugs. Brandi Newton is allergic to percocet [oxycodone-acetaminophen] and ace inhibitors.   HPI  Today, she is being contacted for a post-procedure assessment. The  patient indicates having attained 100% relief of the pain for the duration of the local anesthetic which in fact is currently ongoing.  She refers that the left side is beginning to come back but  she has decided that she will give it a chance and see if she can continue doing well.  Today I spoke to her about the possibility of radiofrequency ablation.  She has had 2 of the diagnostic lumbar facet blocks with similar results and therefore she would be a good candidate for radiofrequency.  However, if they injection by itself provides her with longer than 3 months of good pain relief, the radiofrequency may not be necessary.  However if she continues to get good relief but it wears off too quickly, then we will have to seriously consider the possibility of radiofrequency to extend on that duration.  The patient understood and accepted.  I have reviewed the images from her x-rays and she does not have any hardware that would contraindicate the radiofrequency ablation.  Post-Procedure Evaluation  Procedure (09/11/2021):  Procedure:           Anesthesia, Analgesia, Anxiolysis:  Type: Lumbar Facet, Medial Branch Block(s)          Primary Purpose: Diagnostic Region: Posterolateral Lumbosacral Spine Level: L2, L3, L4, L5, & S1 Medial Branch Level(s). Injecting these levels blocks the L3-4, L4-5, and L5-S1 lumbar facet joints. Laterality: Bilateral   Type: Local Anesthesia Local Anesthetic: Lidocaine 1-2% Sedation: Minimal Anxiolysis  Indication(s): Anxiety & Analgesia Route: Infiltration (Altus/IM) IV Access: Available     Position: Prone    Indications: 1. Lumbar facet syndrome (Bilateral)   2. Spondylosis without myelopathy or radiculopathy, lumbosacral region   3. Lumbar facet hypertrophy (Multilevel) (Bilateral)   4. Lumbar Grade 1 Retrolisthesis of L3/L4   5. DDD (degenerative disc disease), lumbosacral   6. Chronic low back pain (1ry area of Pain) (Bilateral) (R>L) w/o sciatica     Pain  Score: Pre-procedure: 5 /10 Post-procedure: 0-No pain/10   Anxiolysis: Please see nurses note.  Effectiveness during initial hour after procedure (Ultra-Short Term Relief): 100 %.  Local anesthetic used: Long-acting (4-6 hours) Effectiveness: Defined as any analgesic benefit obtained secondary to the administration of local anesthetics. This carries significant diagnostic value as to the etiological location, or anatomical origin, of the pain. Duration of benefit is expected to coincide with the duration of the local anesthetic used.  Effectiveness during initial 4-6 hours after procedure (Short-Term Relief): 100 %.  Long-term benefit: Defined as any relief past the pharmacologic duration of the local anesthetics.  Effectiveness past the initial 6 hours after procedure (Long-Term Relief): 100 %.  Benefits, current: Defined as benefit present at the time of this evaluation.   Analgesia: The patient indicates having attained 100% relief of the pain for the duration of the local anesthetic which at this point seems to be ongoing. Function: Brandi Newton reports improvement in function ROM: Brandi Newton reports improvement in ROM  Pharmacotherapy Assessment   Analgesic: None MME/day: 0 mg/day   Monitoring:  PMP: PDMP reviewed during this encounter.       Pharmacotherapy: No side-effects or adverse reactions reported. Compliance: No problems identified. Effectiveness: Clinically acceptable. Plan: Refer to "POC". UDS:  Summary  Date Value Ref Range Status  03/19/2021 Note  Final    Comment:    ==================================================================== Compliance Drug Analysis, Ur ==================================================================== Test                             Result       Flag       Units  Drug Present and Declared for Prescription Verification   Gabapentin  PRESENT      EXPECTED   Citalopram                     PRESENT      EXPECTED    Desmethylcitalopram            PRESENT      EXPECTED    Desmethylcitalopram is an expected metabolite of citalopram or the    enantiomeric form, escitalopram.    Acetaminophen                  PRESENT      EXPECTED  Drug Present not Declared for Prescription Verification   Dextromethorphan               PRESENT      UNEXPECTED   Dextrorphan/Levorphanol        PRESENT      UNEXPECTED    Dextrorphan is an expected metabolite of dextromethorphan, an over-    the-counter or prescription cough suppressant. Dextrorphan cannot be    distinguished from the scheduled prescription medication levorphanol    by the method used for analysis.  ==================================================================== Test                      Result    Flag   Units      Ref Range   Creatinine              91               mg/dL      >=20 ==================================================================== Declared Medications:  The flagging and interpretation on this report are based on the  following declared medications.  Unexpected results may arise from  inaccuracies in the declared medications.   **Note: The testing scope of this panel includes these medications:   Escitalopram (Lexapro)  Gabapentin (Neurontin)   **Note: The testing scope of this panel does not include small to  moderate amounts of these reported medications:   Acetaminophen (Tylenol)   **Note: The testing scope of this panel does not include the  following reported medications:   Albuterol (Ventolin HFA)  Atorvastatin (Lipitor)  Benzonatate (Tessalon)  Cetirizine (Zyrtec)  Cyanocobalamin  Fluticasone (Flonase)  Hydrochlorothiazide (Hydrodiuril)  Losartan (Cozaar)  Polyethylene Glycol (MiraLAX)  Tiotropium (Spiriva)  Vitamin D ==================================================================== For clinical consultation, please call [phone]. ====================================================================      Laboratory Chemistry Profile   Renal Lab Results  Component Value Date   BUN 23 03/19/2021   CREATININE 1.40 (H) 03/19/2021   BCR 16 03/19/2021   GFRAA 39 (L) 05/15/2020   GFRNONAA 34 (L) 05/15/2020    Hepatic Lab Results  Component Value Date   AST 16 03/19/2021   ALT 17 05/03/2020   ALBUMIN 4.2 03/19/2021   ALKPHOS 96 03/19/2021   LIPASE 34 08/28/2015    Electrolytes Lab Results  Component Value Date   NA 141 03/19/2021   K 3.9 03/19/2021   CL 102 03/19/2021   CALCIUM 10.2 03/19/2021   MG 1.9 03/19/2021    Bone Lab Results  Component Value Date   25OHVITD1 55 03/19/2021   25OHVITD2 <1.0 03/19/2021   25OHVITD3 55 03/19/2021    Inflammation (CRP: Acute Phase) (ESR: Chronic Phase) Lab Results  Component Value Date   CRP 18 (H) 03/19/2021   ESRSEDRATE 24 03/19/2021         Note: Above Lab results reviewed.  Imaging  DG PAIN CLINIC C-ARM 1-60 MIN NO  REPORT Fluoro was used, but no Radiologist interpretation will be provided.  Please refer to "NOTES" tab for provider progress note.  Assessment  The primary encounter diagnosis was Lumbar facet syndrome (Bilateral). Diagnoses of Spondylosis without myelopathy or radiculopathy, lumbosacral region, Lumbar facet hypertrophy (Multilevel) (Bilateral), Lumbar Grade 1 Retrolisthesis of L3/L4, DDD (degenerative disc disease), lumbosacral, and Chronic low back pain (1ry area of Pain) (Bilateral) (R>L) w/o sciatica were also pertinent to this visit.  Plan of Care  Problem-specific:  No problem-specific Assessment & Plan notes found for this encounter.  Brandi Newton has a current medication list which includes the following long-term medication(s): albuterol, atorvastatin, cetirizine, chlorthalidone, escitalopram, fluticasone, losartan, and gabapentin.  Pharmacotherapy (Medications Ordered): No orders of the defined types were  placed in this encounter.  Orders:  No orders of the defined types were placed in this encounter.  Follow-up plan:   No follow-ups on file.      Interventional Therapies  Risk  Complexity Considerations:   Estimated body mass index is 31.32 kg/m as calculated from the following:   Height as of 09/11/21: '5\' 7"'  (1.702 m).   Weight as of 09/11/21: 200 lb (90.7 kg). WNL   Planned  Pending:      Under consideration:   Therapeutic bilateral lumbar facet RFA #1  Therapeutic/palliative bilateral lumbar facet MBB #3  Diagnostic left sacroiliac joint block #1    Completed:   Diagnostic/therapeutic bilateral lumbar (PSIS) TPI/MNB x1 (03/20/2021) (20/30/70/70)  Diagnostic bilateral lumbar facet block x2 (09/11/2021) (100/100/100/100)  Diagnostic left SI joint block x1 (06/19/2021) (100/100/100/90-100)  Diagnostic left L4 TFESI x1 (07/15/2021) (100/100/20/20)    Therapeutic  Palliative (PRN) options:   Therapeutic/palliative bilateral lumbar facet MBB     Recent Visits Date Type Provider Dept  09/11/21 Procedure visit Milinda Pointer, MD [facility]  07/29/21 Office Visit Milinda Pointer, MD [facility]  07/15/21 Procedure visit Milinda Pointer, MD [facility]  07/03/21 Office Visit Milinda Pointer, MD [facility]  Showing recent visits within past 90 days and meeting all other requirements Today's Visits Date Type Provider Dept  09/25/21 Office Visit Milinda Pointer, MD [facility]  Showing today's visits and meeting all other requirements Future Appointments No visits were found meeting these conditions. Showing future appointments within next 90 days and meeting all other requirements I discussed the assessment and treatment plan with the patient. The patient was provided an opportunity to ask questions and all were answered. The patient agreed with the plan and demonstrated an understanding of the  instructions.  Patient advised to call back or seek an in-person evaluation if the symptoms or condition worsens.  Duration of encounter: 18 minutes.  Note by: Gaspar Cola, MD Date: 09/25/2021; Time: 2:51 PM

## 2021-11-12 ENCOUNTER — Other Ambulatory Visit: Payer: Self-pay | Admitting: Family Medicine

## 2021-11-12 DIAGNOSIS — Z1231 Encounter for screening mammogram for malignant neoplasm of breast: Secondary | ICD-10-CM

## 2021-11-24 ENCOUNTER — Other Ambulatory Visit: Payer: Self-pay

## 2021-11-24 ENCOUNTER — Ambulatory Visit
Admission: RE | Admit: 2021-11-24 | Discharge: 2021-11-24 | Disposition: A | Discharge status: Home / Self Care | Payer: Medicare PPO | Source: Ambulatory Visit | Attending: Family Medicine | Admitting: Family Medicine

## 2021-11-24 DIAGNOSIS — Z1231 Encounter for screening mammogram for malignant neoplasm of breast: Secondary | ICD-10-CM | POA: Insufficient documentation

## 2022-11-21 IMAGING — CR DG LUMBAR SPINE COMPLETE W/ BEND
7 series · 7 of 7 positions shown · non-contrast
Comparison: MRI lumbar spine 12/24/2020

CLINICAL DATA: Abnormal MRI lumbar spine

EXAM:
LUMBAR SPINE - COMPLETE WITH BENDING VIEWS

[l-spine ap]
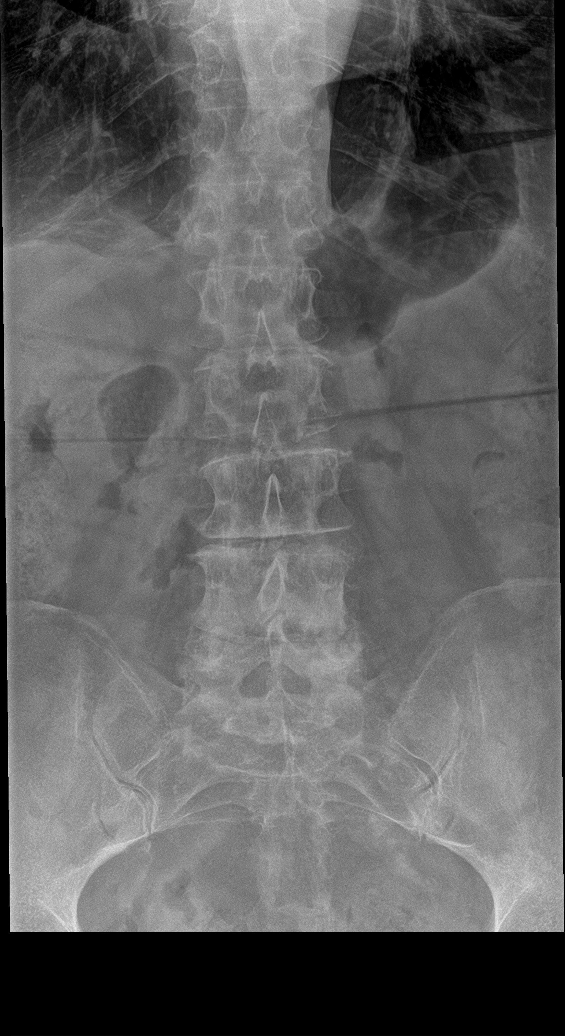

[l-spine obl (1 of 2)]
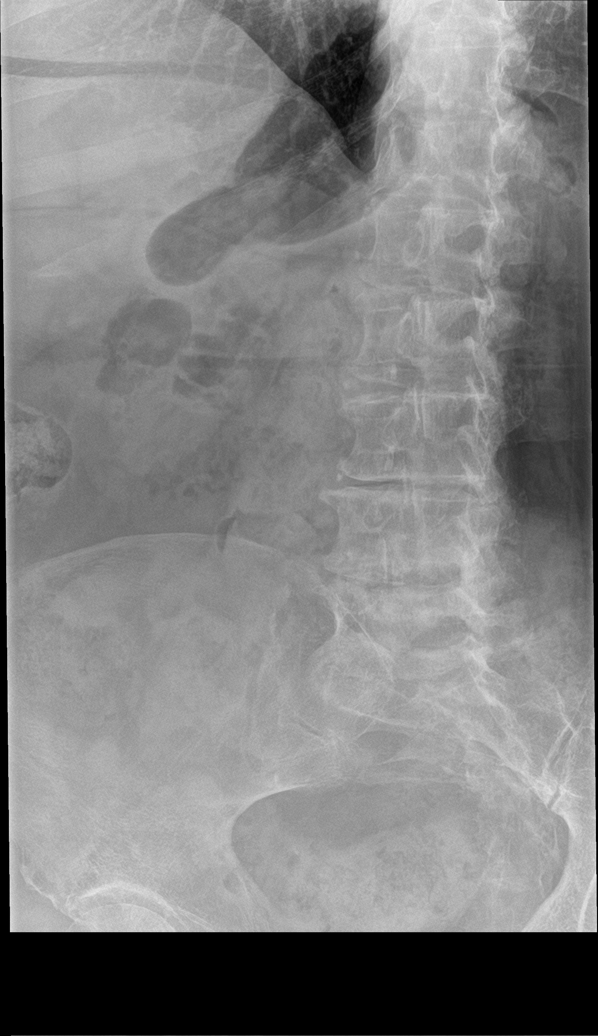

[l-spine obl (2 of 2)]
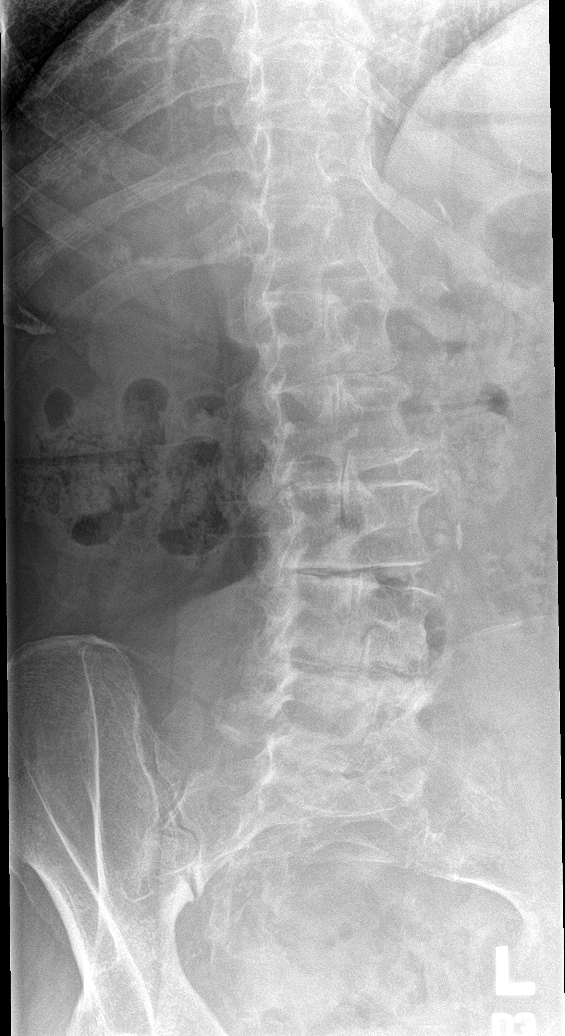

[l-spine lat]
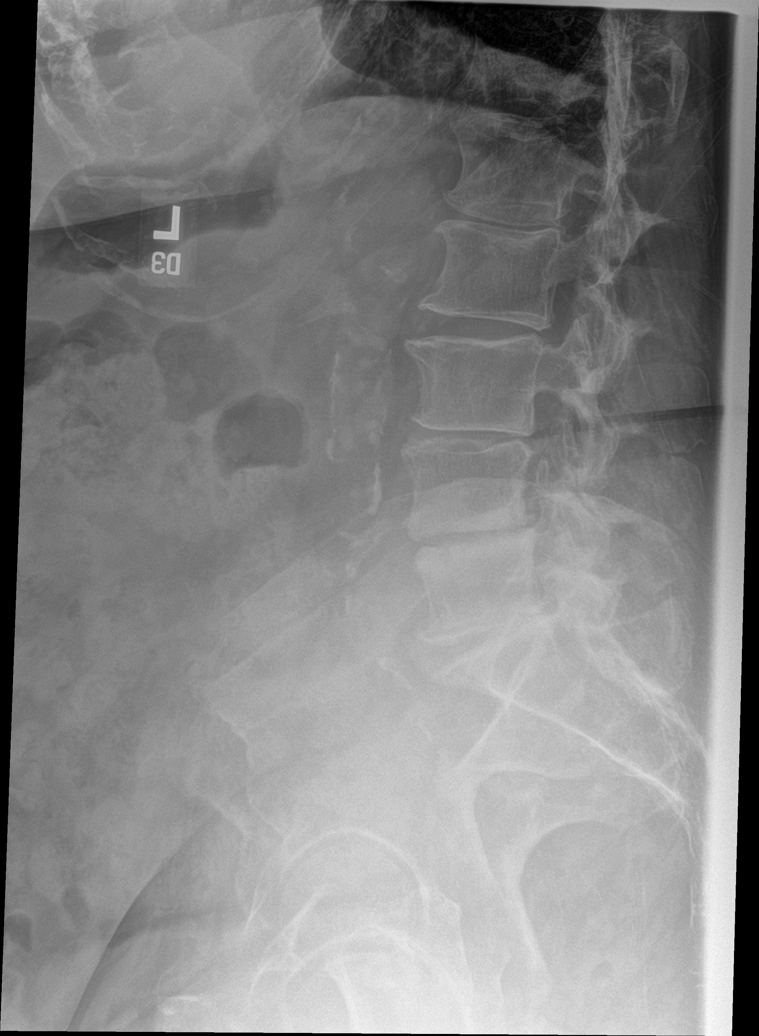

[l-spine flex]
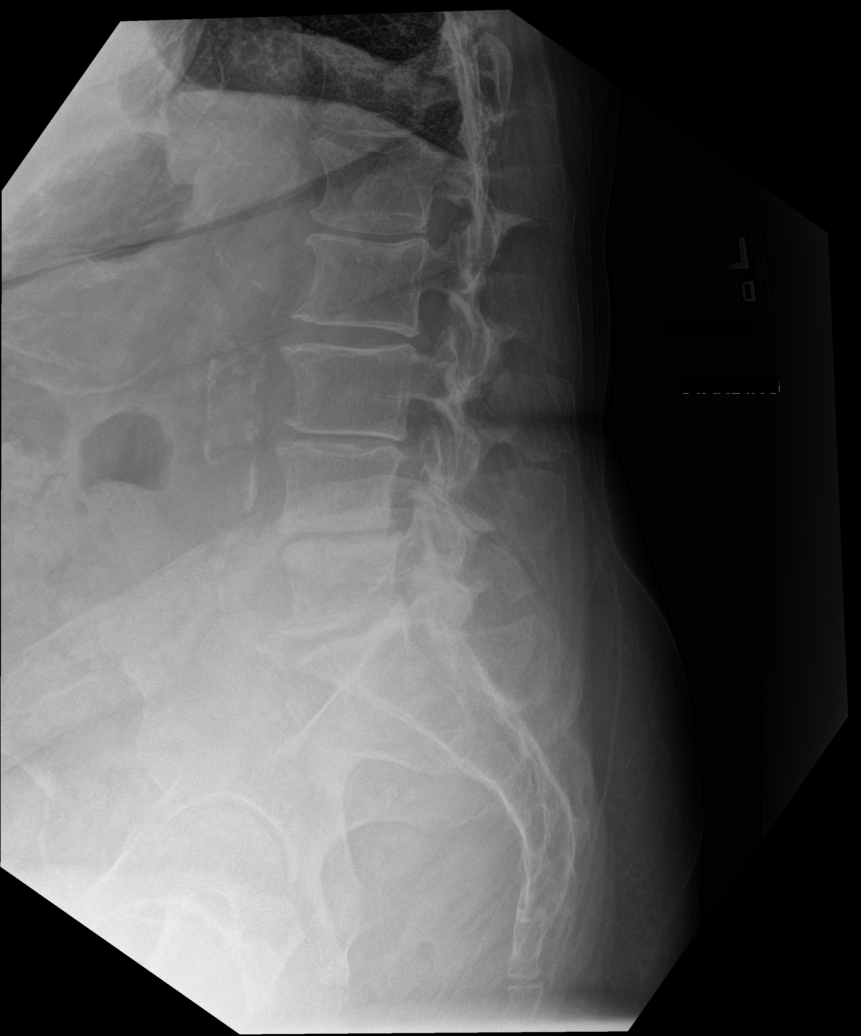

[l-spine ext]
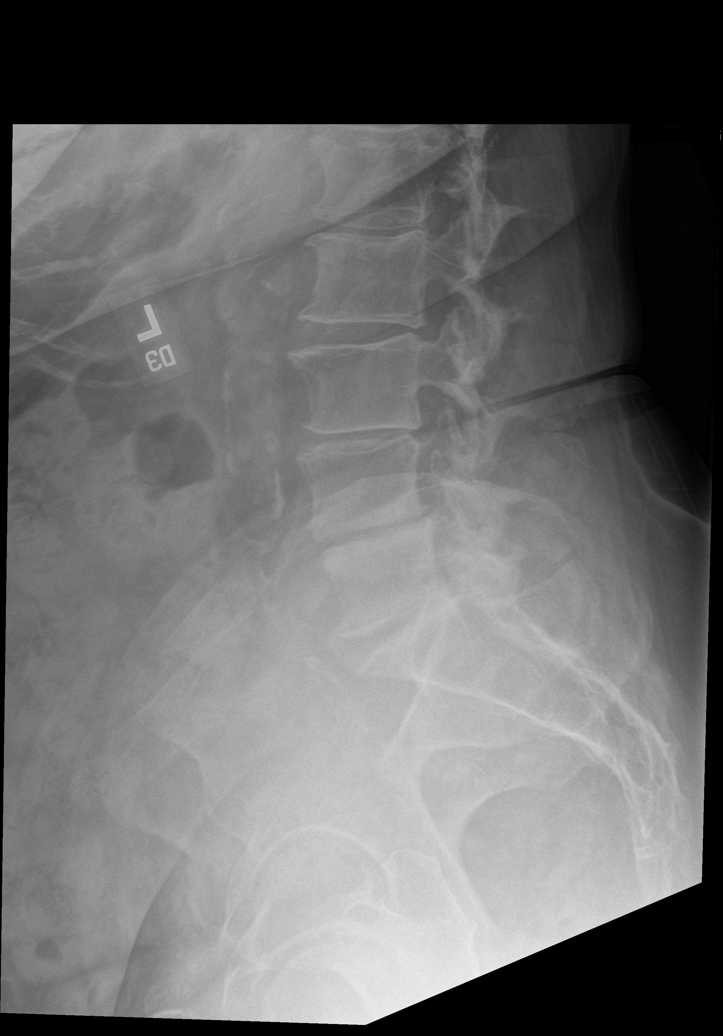

[l-spine spot]
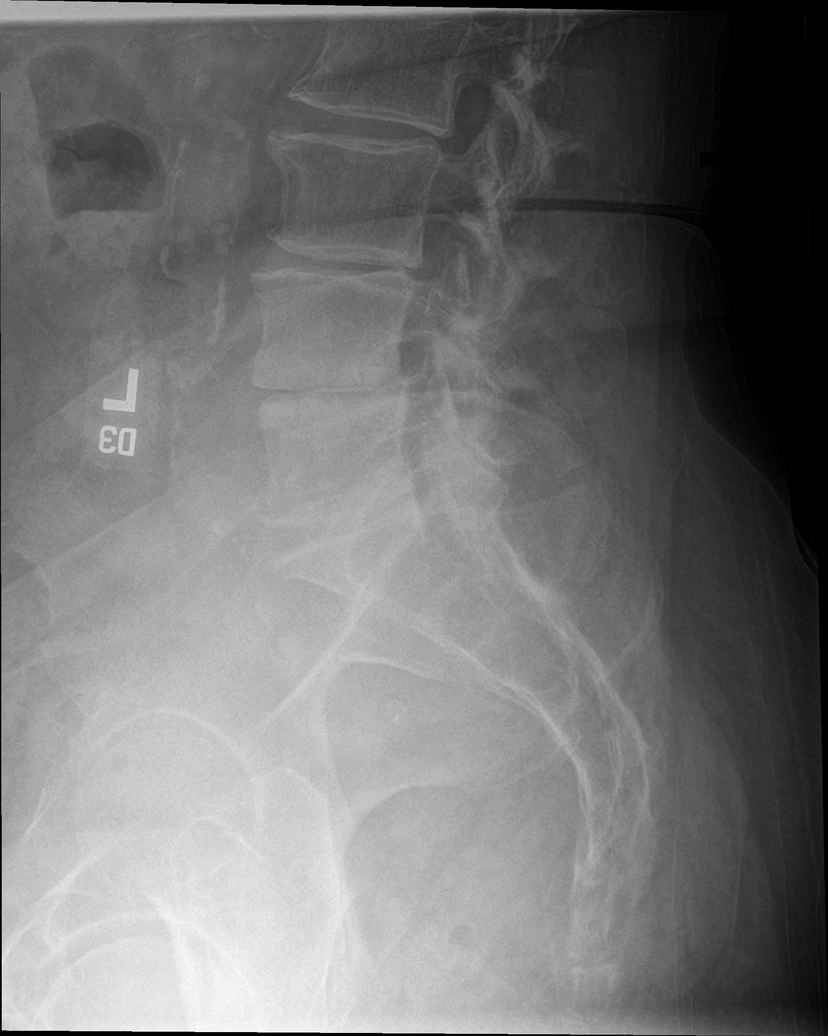

[7 of 7 positions shown; findings below may reference images not displayed]

FINDINGS: Five non-rib-bearing lumbar vertebra.

Osseous demineralization.

Multilevel disc space narrowing and endplate spur formation greatest
at L3-L4 and L4-L5.

Vertebral body heights maintained.

Facet degenerative changes lower lumbar spine.

Minimal anterolisthesis L4-L5.

Question AP narrowing of spinal canal at L5.

No fracture, additional subluxation, or bone destruction.

No spondylolysis.

SI joints preserved.

Atherosclerotic calcification aorta.
IMPRESSION: Degenerative disc and facet disease changes greatest at lower lumbar
spine.

Question AP narrowing of spinal canal at L5.

No acute abnormalities.

Aortic Atherosclerosis ([id number]).

## 2023-02-08 ENCOUNTER — Other Ambulatory Visit: Payer: Self-pay | Admitting: Family Medicine

## 2023-02-08 DIAGNOSIS — Z1231 Encounter for screening mammogram for malignant neoplasm of breast: Secondary | ICD-10-CM

## 2023-03-08 ENCOUNTER — Ambulatory Visit
Admission: RE | Admit: 2023-03-08 | Discharge: 2023-03-08 | Disposition: A | Discharge status: Home / Self Care | Payer: Medicare PPO | Source: Ambulatory Visit | Attending: Family Medicine | Admitting: Family Medicine

## 2023-03-08 DIAGNOSIS — Z1231 Encounter for screening mammogram for malignant neoplasm of breast: Secondary | ICD-10-CM | POA: Diagnosis not present

## 2024-02-08 ENCOUNTER — Other Ambulatory Visit: Payer: Self-pay | Admitting: Family Medicine

## 2024-02-08 DIAGNOSIS — Z1231 Encounter for screening mammogram for malignant neoplasm of breast: Secondary | ICD-10-CM

## 2024-03-27 ENCOUNTER — Ambulatory Visit
Admission: RE | Admit: 2024-03-27 | Discharge: 2024-03-27 | Disposition: A | Discharge status: Home / Self Care | Source: Ambulatory Visit | Attending: Family Medicine | Admitting: Family Medicine

## 2024-03-27 DIAGNOSIS — Z1231 Encounter for screening mammogram for malignant neoplasm of breast: Secondary | ICD-10-CM | POA: Diagnosis present

## 2024-05-29 ENCOUNTER — Other Ambulatory Visit: Payer: Self-pay | Admitting: Specialist

## 2024-05-29 DIAGNOSIS — J849 Interstitial pulmonary disease, unspecified: Secondary | ICD-10-CM

## 2024-05-29 DIAGNOSIS — R918 Other nonspecific abnormal finding of lung field: Secondary | ICD-10-CM

## 2024-05-31 ENCOUNTER — Ambulatory Visit
Admission: RE | Admit: 2024-05-31 | Discharge: 2024-05-31 | Disposition: A | Discharge status: Home / Self Care | Source: Ambulatory Visit | Attending: Specialist | Admitting: Specialist

## 2024-05-31 DIAGNOSIS — R918 Other nonspecific abnormal finding of lung field: Secondary | ICD-10-CM | POA: Diagnosis present

## 2024-05-31 DIAGNOSIS — J849 Interstitial pulmonary disease, unspecified: Secondary | ICD-10-CM | POA: Insufficient documentation

## 2024-06-14 ENCOUNTER — Other Ambulatory Visit: Payer: Self-pay | Admitting: Family Medicine

## 2024-06-14 DIAGNOSIS — Z780 Asymptomatic menopausal state: Secondary | ICD-10-CM
